# Patient Record
Sex: Male | Born: 1990 | Race: Black or African American | Hispanic: No | Marital: Single | Smoking: Former smoker
Health system: Southern US, Community
[De-identification: ages and names within clinical notes are randomized; demographics above are authoritative.]

## PROBLEM LIST (undated history)

## (undated) DIAGNOSIS — I10 Essential (primary) hypertension: Secondary | ICD-10-CM

## (undated) DIAGNOSIS — K859 Acute pancreatitis without necrosis or infection, unspecified: Secondary | ICD-10-CM

## (undated) DIAGNOSIS — F419 Anxiety disorder, unspecified: Secondary | ICD-10-CM

---
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
## 2011-07-29 ENCOUNTER — Emergency Department: Payer: Self-pay | Admitting: Emergency Medicine

## 2011-09-02 ENCOUNTER — Emergency Department: Payer: Self-pay | Admitting: Emergency Medicine

## 2011-09-02 LAB — COMPREHENSIVE METABOLIC PANEL
Albumin: 3.9 g/dL (ref 3.8–5.6)
Alkaline Phosphatase: 109 U/L (ref 98–317)
Anion Gap: 6 — ABNORMAL LOW (ref 7–16)
BUN: 9 mg/dL (ref 7–18)
Bilirubin,Total: 0.8 mg/dL (ref 0.2–1.0)
Calcium, Total: 8.7 mg/dL — ABNORMAL LOW (ref 9.0–10.7)
Creatinine: 1.04 mg/dL (ref 0.60–1.30)
EGFR (African American): >60
EGFR (Non-African Amer.): >60
Glucose: 84 mg/dL (ref 65–99)
Osmolality: 283 (ref 275–301)
SGOT(AST): 19 U/L (ref 10–41)
SGPT (ALT): 18 U/L
Sodium: 143 mmol/L (ref 136–145)

## 2011-09-02 LAB — CBC
MCV: 87 fL (ref 80–100)
WBC: 4.1 10*3/uL (ref 3.8–10.6)

## 2011-09-02 LAB — URINALYSIS, COMPLETE
Bacteria: NONE SEEN
Bilirubin,UR: NEGATIVE
Glucose,UR: NEGATIVE mg/dL (ref 0–75)
Leukocyte Esterase: NEGATIVE
Nitrite: NEGATIVE
Ph: 5 (ref 4.5–8.0)
RBC,UR: 2 /HPF (ref 0–5)
Specific Gravity: 1.029 (ref 1.003–1.030)
Squamous Epithelial: <1

## 2012-06-13 ENCOUNTER — Emergency Department: Payer: Self-pay | Admitting: Emergency Medicine

## 2012-08-18 ENCOUNTER — Emergency Department: Payer: Self-pay | Admitting: Emergency Medicine

## 2012-08-18 LAB — COMPREHENSIVE METABOLIC PANEL
Albumin: 4.2 g/dL (ref 3.4–5.0)
Alkaline Phosphatase: 83 U/L (ref 50–136)
Anion Gap: 6 — ABNORMAL LOW (ref 7–16)
BUN: 10 mg/dL (ref 7–18)
Calcium, Total: 9.2 mg/dL (ref 8.5–10.1)
Co2: 26 mmol/L (ref 21–32)
EGFR (African American): >60
EGFR (Non-African Amer.): >60
Glucose: 83 mg/dL (ref 65–99)
Osmolality: 276 (ref 275–301)
Potassium: 4.3 mmol/L (ref 3.5–5.1)
SGOT(AST): 22 U/L (ref 15–37)
SGPT (ALT): 23 U/L (ref 12–78)
Sodium: 139 mmol/L (ref 136–145)
Total Protein: 7.8 g/dL (ref 6.4–8.2)

## 2012-08-18 LAB — URINALYSIS, COMPLETE
Bacteria: NONE SEEN
Bilirubin,UR: NEGATIVE
Glucose,UR: NEGATIVE mg/dL (ref 0–75)
Ketone: NEGATIVE
Nitrite: NEGATIVE
Ph: 6 (ref 4.5–8.0)
Protein: NEGATIVE

## 2012-08-18 LAB — CBC
HCT: 44.8 % (ref 40.0–52.0)
MCV: 89 fL (ref 80–100)
Platelet: 349 10*3/uL (ref 150–440)
RBC: 5.06 10*6/uL (ref 4.40–5.90)
RDW: 13.2 % (ref 11.5–14.5)
WBC: 6 10*3/uL (ref 3.8–10.6)

## 2012-08-18 LAB — LIPASE, BLOOD: Lipase: 94 U/L (ref 73–393)

## 2013-01-01 ENCOUNTER — Emergency Department: Payer: Self-pay | Admitting: Emergency Medicine

## 2015-06-15 ENCOUNTER — Emergency Department
Admission: EM | Admit: 2015-06-15 | Discharge: 2015-06-15 | Disposition: A | Discharge status: Home / Self Care | Payer: Medicaid - Out of State | Attending: Emergency Medicine | Admitting: Emergency Medicine

## 2015-06-15 ENCOUNTER — Emergency Department: Payer: Medicaid - Out of State

## 2015-06-15 ENCOUNTER — Encounter: Payer: Self-pay | Admitting: Emergency Medicine

## 2015-06-15 DIAGNOSIS — Y9389 Activity, other specified: Secondary | ICD-10-CM | POA: Insufficient documentation

## 2015-06-15 DIAGNOSIS — R1013 Epigastric pain: Secondary | ICD-10-CM

## 2015-06-15 DIAGNOSIS — T148 Other injury of unspecified body region: Secondary | ICD-10-CM | POA: Insufficient documentation

## 2015-06-15 DIAGNOSIS — Y998 Other external cause status: Secondary | ICD-10-CM | POA: Insufficient documentation

## 2015-06-15 DIAGNOSIS — K219 Gastro-esophageal reflux disease without esophagitis: Secondary | ICD-10-CM | POA: Insufficient documentation

## 2015-06-15 DIAGNOSIS — R079 Chest pain, unspecified: Secondary | ICD-10-CM

## 2015-06-15 DIAGNOSIS — Y9289 Other specified places as the place of occurrence of the external cause: Secondary | ICD-10-CM | POA: Insufficient documentation

## 2015-06-15 DIAGNOSIS — H6123 Impacted cerumen, bilateral: Secondary | ICD-10-CM

## 2015-06-15 DIAGNOSIS — X58XXXA Exposure to other specified factors, initial encounter: Secondary | ICD-10-CM | POA: Insufficient documentation

## 2015-06-15 DIAGNOSIS — M546 Pain in thoracic spine: Secondary | ICD-10-CM | POA: Insufficient documentation

## 2015-06-15 DIAGNOSIS — Z87891 Personal history of nicotine dependence: Secondary | ICD-10-CM | POA: Insufficient documentation

## 2015-06-15 LAB — URINALYSIS COMPLETE WITH MICROSCOPIC (ARMC ONLY)
BACTERIA UA: NONE SEEN
BILIRUBIN URINE: NEGATIVE
GLUCOSE, UA: NEGATIVE mg/dL
Hgb urine dipstick: NEGATIVE
Ketones, ur: NEGATIVE mg/dL
Leukocytes, UA: NEGATIVE
Nitrite: NEGATIVE
PH: 7 (ref 5.0–8.0)
Protein, ur: NEGATIVE mg/dL
Specific Gravity, Urine: 1.017 (ref 1.005–1.030)

## 2015-06-15 LAB — COMPREHENSIVE METABOLIC PANEL
ALT: 15 U/L — AB (ref 17–63)
AST: 17 U/L (ref 15–41)
Albumin: 4.3 g/dL (ref 3.5–5.0)
Alkaline Phosphatase: 73 U/L (ref 38–126)
Anion gap: 6 (ref 5–15)
BUN: 11 mg/dL (ref 6–20)
CHLORIDE: 106 mmol/L (ref 101–111)
CO2: 27 mmol/L (ref 22–32)
CREATININE: 1.05 mg/dL (ref 0.61–1.24)
Calcium: 9.3 mg/dL (ref 8.9–10.3)
GFR calc non Af Amer: >60 mL/min (ref >60)
Glucose, Bld: 107 mg/dL — ABNORMAL HIGH (ref 65–99)
POTASSIUM: 3.7 mmol/L (ref 3.5–5.1)
SODIUM: 139 mmol/L (ref 135–145)
Total Bilirubin: 0.6 mg/dL (ref 0.3–1.2)
Total Protein: 7.3 g/dL (ref 6.5–8.1)

## 2015-06-15 LAB — CBC
HEMATOCRIT: 45.9 % (ref 40.0–52.0)
Hemoglobin: 15 g/dL (ref 13.0–18.0)
MCH: 28.2 pg (ref 26.0–34.0)
MCHC: 32.7 g/dL (ref 32.0–36.0)
MCV: 86.2 fL (ref 80.0–100.0)
Platelets: 326 10*3/uL (ref 150–440)
RBC: 5.33 MIL/uL (ref 4.40–5.90)
RDW: 13.5 % (ref 11.5–14.5)
WBC: 6 10*3/uL (ref 3.8–10.6)

## 2015-06-15 LAB — TROPONIN I: Troponin I: <0.03 ng/mL (ref <0.031)

## 2015-06-15 LAB — LIPASE, BLOOD: LIPASE: 21 U/L (ref 11–51)

## 2015-06-15 MED ORDER — RANITIDINE HCL 150 MG PO TABS: 150.0000 mg | ORAL_TABLET | Freq: Two times a day (BID) | ORAL | Status: DC

## 2015-06-15 NOTE — ED Provider Notes (Signed)
[facility] Emergency Department Provider Note  ____________________________________________  Time seen: Approximately 730 PM  I have reviewed the triage vital signs and the nursing notes.   HISTORY  Chief Complaint Abdominal Pain; Otalgia; and Back Pain    HPI ARCHIT LEGER is a 25 y.o. male without any chronic medical problems was presenting to the emergency department today with abdominal burning. He says that the pain is to his left upper quadrant and radiating through to his back. He also says that he has left shoulder blade pain. He says he is also having chest pain as well as pressure in his ears bilaterally but worse in the right ear. He says he uses a Q-tip to clean his ears out regularly. Denies any worsening with exertion of the chest pain. Says that the chest pain is there all the time and is mild to moderate. Symptoms have been ongoing for at least 3-4 days. Says that he has also increased the amount of spicy and fatty foods that he has eaten over the past several days. Admits to nausea but no vomiting. Denies any diarrhea.   History reviewed. No pertinent past medical history.  There are no active problems to display for this patient.   History reviewed. No pertinent past surgical history.  No current outpatient prescriptions on file.  Allergies Review of patient's allergies indicates no known allergies.  No family history on file.  Social History Social History  Substance Use Topics  . Smoking status: Former Smoker    Quit date: 04/17/2015  . Smokeless tobacco: None  . Alcohol Use: No    Review of Systems Constitutional: No fever/chills Eyes: No visual changes. ENT: No sore throat. Cardiovascular: As above Respiratory: Denies shortness of breath. Gastrointestinal:  no vomiting.  No diarrhea.  No constipation. Genitourinary: Negative for dysuria. Musculoskeletal: Negative for back pain. Skin: Negative for  rash. Neurological: Negative for headaches, focal weakness or numbness.  10-point ROS otherwise negative.  ____________________________________________   PHYSICAL EXAM:  VITAL SIGNS: ED Triage Vitals  Enc Vitals Group     BP 06/15/15 1540 143/76 mmHg     Pulse Rate 06/15/15 1540 87     Resp 06/15/15 1540 20     Temp 06/15/15 1540 97.7 F (36.5 C)     Temp Source 06/15/15 1540 Oral     SpO2 06/15/15 1540 98 %     Weight 06/15/15 1540 184 lb (83.462 kg)     Height 06/15/15 1540  (1.651 m)     Head Cir --      Peak Flow --      Pain Score 06/15/15 1542 9     Pain Loc --      Pain Edu? --      Excl. in GC? --     Constitutional: Alert and oriented. Well appearing and in no acute distress. Eyes: Conjunctivae are normal. PERRL. EOMI. Head: Atraumatic. Cerumen impaction to the bilateral external canals. Nose: No congestion/rhinnorhea. Mouth/Throat: Mucous membranes are moist.   Neck: No stridor.   Cardiovascular: Normal rate, regular rhythm. Grossly normal heart sounds.  Good peripheral circulation. Respiratory: Normal respiratory effort.  No retractions. Lungs CTAB. Gastrointestinal: Soft and nontender. No distention. No abdominal bruits. No CVA tenderness. Musculoskeletal: No lower extremity tenderness nor edema.  No joint effusions. Chest pain is not reproducible. No tenderness or deformity to the C, T or L spines. Tenderness to palpation to left sided rhomboid group. Neurologic:  Normal speech and language. No  gross focal neurologic deficits are appreciated.  Skin:  Skin is warm, dry and intact. No rash noted. Psychiatric: Mood and affect are normal. Speech and behavior are normal.  ____________________________________________   LABS (all labs ordered are listed, but only abnormal results are displayed)  Labs Reviewed  COMPREHENSIVE METABOLIC PANEL - Abnormal; Notable for the following:    Glucose, Bld 107 (*)    ALT 15 (*)    All other components within normal  limits  URINALYSIS COMPLETEWITH MICROSCOPIC ([facility] ONLY) - Abnormal; Notable for the following:    Color, Urine YELLOW (*)    APPearance CLEAR (*)    Squamous Epithelial / LPF 0-5 (*)    All other components within normal limits  LIPASE, BLOOD  CBC  TROPONIN I   ____________________________________________  EKG  ED ECG REPORT I, Arelia Longest, the attending physician, personally viewed and interpreted this ECG.   Date: 06/15/2015  EKG Time: 1934  Rate: 71  Rhythm: normal sinus rhythm  Axis: Normal  Intervals:none  ST&T Change: Diffuse ST elevation with a concave morphology consistent with an early repolarization. No abnormal T-wave inversion.  ____________________________________________  RADIOLOGY  No active cardiopulmonary disease on the chest x-ray. ____________________________________________   PROCEDURES   Ceruminosis is noted.  Wax is removed by syringing and manual debridement. Instructions for home care to prevent wax buildup are given.  ____________________________________________   INITIAL IMPRESSION / ASSESSMENT AND PLAN / ED COURSE  Pertinent labs & imaging results that were available during my care of the patient were reviewed by me and considered in my medical decision making (see chart for details).  Cerumen was disimpacted and the patient says that his ear pressure is now relieved. Feel that the remaining symptoms can be explained with GERD.PERC negative.    ----------------------------------------- 8:47 PM on 06/15/2015 -----------------------------------------  Patient with very reassuring lab workup. Likely musculoskeletal back pain. We'll treat with Zantac for symptoms of burning in his stomach when lying flat. We'll also give follow-up information with the [facility] clinic. Patient does not have a primary care doctor locally. ____________________________________________   FINAL CLINICAL IMPRESSION(S) / ED DIAGNOSES  Chest pain. Abdominal  pain. Muscle strain.    Myrna Blazer, MD 06/15/15 2049

## 2015-06-15 NOTE — ED Notes (Signed)
Pt states back pain that increases when he lies down at night, states burning in his abdomen and back, states left shoulder blade pain and states he feels lightheaded if he moves too quickly, also states ear pain, states "everything seems faded", pt awake and alert in no acute distress

## 2015-06-15 NOTE — Discharge Instructions (Signed)
Abdominal Pain, Adult Many things can cause abdominal pain. Usually, abdominal pain is not caused by a disease and will improve without treatment. It can often be observed and treated at home. Your health care provider will do a physical exam and possibly order blood tests and X-rays to help determine the seriousness of your pain. However, in many cases, more time must pass before a clear cause of the pain can be found. Before that point, your health care provider may not know if you need more testing or further treatment. HOME CARE INSTRUCTIONS Monitor your abdominal pain for any changes. The following actions may help to alleviate any discomfort you are experiencing:  Only take over-the-counter or prescription medicines as directed by your health care provider.  Do not take laxatives unless directed to do so by your health care provider.  Try a clear liquid diet (broth, tea, or water) as directed by your health care provider. Slowly move to a bland diet as tolerated. SEEK MEDICAL CARE IF:  You have unexplained abdominal pain.  You have abdominal pain associated with nausea or diarrhea.  You have pain when you urinate or have a bowel movement.  You experience abdominal pain that wakes you in the night.  You have abdominal pain that is worsened or improved by eating food.  You have abdominal pain that is worsened with eating fatty foods.  You have a fever. SEEK IMMEDIATE MEDICAL CARE IF:  Your pain does not go away within 2 hours.  You keep throwing up (vomiting).  Your pain is felt only in portions of the abdomen, such as the right side or the left lower portion of the abdomen.  You pass bloody or black tarry stools. MAKE SURE YOU:  Understand these instructions.  Will watch your condition.  Will get help right away if you are not doing well or get worse.   This information is not intended to replace advice given to you by your health care provider. Make sure you discuss  any questions you have with your health care provider.   Document Released: 01/09/2005 Document Revised: 12/21/2014 Document Reviewed: 12/09/2012 Elsevier Interactive Patient Education 2016 [employer]  Back Pain, Adult Back pain is very common. The pain often gets better over time. The cause of back pain is usually not dangerous. Most people can learn to manage their back pain on their own.  HOME CARE  Watch your back pain for any changes. The following actions may help to lessen any pain you are feeling:  Stay active. Start with short walks on flat ground if you can. Try to walk farther each day.  Exercise regularly as told by your doctor. Exercise helps your back heal faster. It also helps avoid future injury by keeping your muscles strong and flexible.  Do not sit, drive, or stand in one place for more than 30 minutes.  Do not stay in bed. Resting more than 1-2 days can slow down your recovery.  Be careful when you bend or lift an object. Use good form when lifting:  Bend at your knees.  Keep the object close to your body.  Do not twist.  Sleep on a firm mattress. Lie on your side, and bend your knees. If you lie on your back, put a pillow under your knees.  Take medicines only as told by your doctor.  Put ice on the injured area.  Put ice in a plastic bag.  Place a towel between your skin and the bag.  Leave the ice on for 20 minutes, 2-3 times a day for the first 2-3 days. After that, you can switch between ice and heat packs.  Avoid feeling anxious or stressed. Find good ways to deal with stress, such as exercise.  Maintain a healthy weight. Extra weight puts stress on your back. GET HELP IF:   You have pain that does not go away with rest or medicine.  You have worsening pain that goes down into your legs or buttocks.  You have pain that does not get better in one week.  You have pain at night.  You lose weight.  You have a fever or chills. GET HELP  RIGHT AWAY IF:   You cannot control when you poop (bowel movement) or pee (urinate).  Your arms or legs feel weak.  Your arms or legs lose feeling (numbness).  You feel sick to your stomach (nauseous) or throw up (vomit).  You have belly (abdominal) pain.  You feel like you may pass out (faint).   This information is not intended to replace advice given to you by your health care provider. Make sure you discuss any questions you have with your health care provider.   Document Released: 09/18/2007 Document Revised: 04/22/2014 Document Reviewed: 08/03/2013 Elsevier Interactive Patient Education 2016 [employer] Inc[employer]  Gastroesophageal Reflux Disease, Adult Normally, food travels down the esophagus and stays in the stomach to be digested. However, when a person has gastroesophageal reflux disease (GERD), food and stomach acid move back up into the esophagus. When this happens, the esophagus becomes sore and inflamed. Over time, GERD can create small holes (ulcers) in the lining of the esophagus.  CAUSES This condition is caused by a problem with the muscle between the esophagus and the stomach (lower esophageal sphincter, or LES). Normally, the LES muscle closes after food passes through the esophagus to the stomach. When the LES is weakened or abnormal, it does not close properly, and that allows food and stomach acid to go back up into the esophagus. The LES can be weakened by certain dietary substances, medicines, and medical conditions, including:  Tobacco use.  Pregnancy.  Having a hiatal hernia.  Heavy alcohol use.  Certain foods and beverages, such as coffee, chocolate, onions, and peppermint. RISK FACTORS This condition is more likely to develop in:  People who have an increased body weight.  People who have connective tissue disorders.  People who use NSAID medicines. SYMPTOMS Symptoms of this condition include:  Heartburn.  Difficult or painful swallowing.  The  feeling of having a lump in the throat.  Abitter taste in the mouth.  Bad breath.  Having a large amount of saliva.  Having an upset or bloated stomach.  Belching.  Chest pain.  Shortness of breath or wheezing.  Ongoing (chronic) cough or a night-time cough.  Wearing away of tooth enamel.  Weight loss. Different conditions can cause chest pain. Make sure to see your health care provider if you experience chest pain. DIAGNOSIS Your health care provider will take a medical history and perform a physical exam. To determine if you have mild or severe GERD, your health care provider may also monitor how you respond to treatment. You may also have other tests, including:  An endoscopy toexamine your stomach and esophagus with a small camera.  A test thatmeasures the acidity level in your esophagus.  A test thatmeasures how much pressure is on your esophagus.  A barium swallow or modified barium swallow to show the shape,  size, and functioning of your esophagus. TREATMENT The goal of treatment is to help relieve your symptoms and to prevent complications. Treatment for this condition may vary depending on how severe your symptoms are. Your health care provider may recommend:  Changes to your diet.  Medicine.  Surgery. HOME CARE INSTRUCTIONS Diet  Follow a diet as recommended by your health care provider. This may involve avoiding foods and drinks such as:  Coffee and tea (with or without caffeine).  Drinks that containalcohol.  Energy drinks and sports drinks.  Carbonated drinks or sodas.  Chocolate and cocoa.  Peppermint and mint flavorings.  Garlic and onions.  Horseradish.  Spicy and acidic foods, including peppers, chili powder, curry powder, vinegar, hot sauces, and barbecue sauce.  Citrus fruit juices and citrus fruits, such as oranges, lemons, and limes.  Tomato-based foods, such as red sauce, chili, salsa, and pizza with red sauce.  Fried and  fatty foods, such as donuts, french fries, potato chips, and high-fat dressings.  High-fat meats, such as hot dogs and fatty cuts of red and white meats, such as rib eye steak, sausage, ham, and bacon.  High-fat dairy items, such as whole milk, butter, and cream cheese.  Eat small, frequent meals instead of large meals.  Avoid drinking large amounts of liquid with your meals.  Avoid eating meals during the 2-3 hours before bedtime.  Avoid lying down right after you eat.  Do not exercise right after you eat. General Instructions  Pay attention to any changes in your symptoms.  Take over-the-counter and prescription medicines only as told by your health care provider. Do not take aspirin, ibuprofen, or other NSAIDs unless your health care provider told you to do so.  Do not use any tobacco products, including cigarettes, chewing tobacco, and e-cigarettes. If you need help quitting, ask your health care provider.  Wear loose-fitting clothing. Do not wear anything tight around your waist that causes pressure on your abdomen.  Raise (elevate) the head of your bed 6 inches (15cm).  Try to reduce your stress, such as with yoga or meditation. If you need help reducing stress, ask your health care provider.  If you are overweight, reduce your weight to an amount that is healthy for you. Ask your health care provider for guidance about a safe weight loss goal.  Keep all follow-up visits as told by your health care provider. This is important. SEEK MEDICAL CARE IF:  You have new symptoms.  You have unexplained weight loss.  You have difficulty swallowing, or it hurts to swallow.  You have wheezing or a persistent cough.  Your symptoms do not improve with treatment.  You have a hoarse voice. SEEK IMMEDIATE MEDICAL CARE IF:  You have pain in your arms, neck, jaw, teeth, or back.  You feel sweaty, dizzy, or light-headed.  You have chest pain or shortness of breath.  You vomit  and your vomit looks like blood or coffee grounds.  You faint.  Your stool is bloody or black.  You cannot swallow, drink, or eat.   This information is not intended to replace advice given to you by your health care provider. Make sure you discuss any questions you have with your health care provider.   Document Released: 01/09/2005 Document Revised: 12/21/2014 Document Reviewed: 07/27/2014 Elsevier Interactive Patient Education [employer]

## 2015-06-15 NOTE — ED Notes (Signed)
Patient presents to the ED with abdominal pain and back pain that patient describes as intermittent burning.  Patient reports that when pain occurs it occurs simultaneously in her abdomen and his back.  Patient has a dark rash to his abdomen and is complaining of difficulty hearing that began after patient used q-tips on Monday.  Patient appears anxious in triage and states, "I just want to find out what's wrong with me before anything gets worse."  Patient denies any dysuria, vomiting and diarrhea.

## 2015-08-09 ENCOUNTER — Emergency Department
Admission: EM | Admit: 2015-08-09 | Discharge: 2015-08-10 | Disposition: A | Discharge status: Home / Self Care | Payer: Self-pay | Attending: Emergency Medicine | Admitting: Emergency Medicine

## 2015-08-09 ENCOUNTER — Encounter: Payer: Self-pay | Admitting: Emergency Medicine

## 2015-08-09 DIAGNOSIS — Z87891 Personal history of nicotine dependence: Secondary | ICD-10-CM | POA: Insufficient documentation

## 2015-08-09 DIAGNOSIS — R51 Headache: Secondary | ICD-10-CM | POA: Insufficient documentation

## 2015-08-09 DIAGNOSIS — M542 Cervicalgia: Secondary | ICD-10-CM | POA: Insufficient documentation

## 2015-08-09 DIAGNOSIS — R519 Headache, unspecified: Secondary | ICD-10-CM

## 2015-08-09 MED ORDER — ONDANSETRON HCL 4 MG/2ML IJ SOLN
4.0000 mg | Freq: Once | INTRAMUSCULAR | Status: AC
  Administered 2015-08-09: 4 mg via INTRAVENOUS
  Filled 2015-08-09: qty 2

## 2015-08-09 MED ORDER — MORPHINE SULFATE (PF) 2 MG/ML IV SOLN
2.0000 mg | Freq: Once | INTRAVENOUS | Status: AC
  Administered 2015-08-09: 2 mg via INTRAVENOUS
  Filled 2015-08-09: qty 1

## 2015-08-09 NOTE — ED Notes (Addendum)
Patient ambulatory to triage with steady gait, without difficulty or distress noted; pt reports x week having neck pain causing HA; denies any injury

## 2015-08-09 NOTE — ED Provider Notes (Signed)
[facility] Emergency Department Provider Note  ____________________________________________  Time seen: 11:15  I have reviewed the triage vital signs and the nursing notes.   HISTORY  Chief Complaint Torticollis and Headache      HPI Marcus Small is a 25 y.o. male with no previous history of chronic headaches presents with one-week history of left-sided occipital headache/neck pain that is currently 10 out of 10. Patient states that the pain is persistent over the period of that time and unrelieved with 800 mg of ibuprofen and BC powder intermittently. Patient also admits to intermittent left eye blurry vision which spontaneous resolution. Patient denies any family history of aneurysm no family history of migraines. Patient denies any weakness numbness or gait instability. Patient denies any changes in speech     Past medical history None There are no active problems to display for this patient.   Past Surgical history None  Current Outpatient Rx  Name  Route  Sig  Dispense  Refill  . ranitidine (ZANTAC) 150 MG tablet   Oral   Take 1 tablet (150 mg total) by mouth 2 (two) times daily.   60 tablet   0     Allergies Shellfish allergy  No family history on file.  Social History Social History  Substance Use Topics  . Smoking status: Former Smoker    Quit date: 04/17/2015  . Smokeless tobacco: None  . Alcohol Use: No    Review of Systems  Constitutional: Negative for fever. Eyes: Positive for intermittent left eye blurred vision ENT: Negative for sore throat. Cardiovascular: Negative for chest pain. Respiratory: Negative for shortness of breath. Gastrointestinal: Negative for abdominal pain, vomiting and diarrhea. Genitourinary: Negative for dysuria. Musculoskeletal: Negative for back pain. Skin: Negative for rash. Neurological: Positive for headache/neck pain and left eye blurred vision.   10-point ROS otherwise  negative.  ____________________________________________   PHYSICAL EXAM:  VITAL SIGNS: ED Triage Vitals  Enc Vitals Group     BP 08/09/15 2255 140/76 mmHg     Pulse Rate 08/09/15 2255 70     Resp 08/09/15 2255 20     Temp 08/09/15 2255 98 F (36.7 C)     Temp Source 08/09/15 2255 Oral     SpO2 08/09/15 2255 98 %     Weight 08/09/15 2255 180 lb (81.647 kg)     Height 08/09/15 2255 5\' 5"  (1.651 m)     Head Cir --      Peak Flow --      Pain Score 08/09/15 2254 10     Pain Loc --      Pain Edu? --      Excl. in GC? --      Constitutional: Alert and oriented. Apparent discomfort Eyes: Conjunctivae are normal. PERRL. Normal extraocular movements. ENT   Head: Normocephalic and atraumatic.   Nose: No congestion/rhinnorhea.   Mouth/Throat: Mucous membranes are moist.   Neck: No stridor. Hematological/Lymphatic/Immunilogical: No cervical lymphadenopathy. Cardiovascular: Normal rate, regular rhythm. Normal and symmetric distal pulses are present in all extremities. No murmurs, rubs, or gallops. Respiratory: Normal respiratory effort without tachypnea nor retractions. Breath sounds are clear and equal bilaterally. No wheezes/rales/rhonchi. Gastrointestinal: Soft and nontender. No distention. There is no CVA tenderness. Genitourinary: deferred Musculoskeletal: Nontender with normal range of motion in all extremities. No joint effusions.  No lower extremity tenderness nor edema. Left trapezius pain with palpation Neurologic:  Normal speech and language. No gross focal neurologic deficits are appreciated. Speech is normal.  Skin:  Skin is warm, dry and intact. No rash noted. Psychiatric: Mood and affect are normal. Speech and behavior are normal. Patient exhibits appropriate insight and judgment.  ____________________________________________    LABS (pertinent positives/negatives)  Labs Reviewed  BASIC METABOLIC PANEL - Abnormal; Notable for the following:     Glucose, Bld 103 (*)    All other components within normal limits  CBC       RADIOLOGY CT Angio Neck W/Cm &/Or Wo/Cm (Final result) Result time: 08/10/15 01:30:44   Final result by Rad Results In Interface (08/10/15 01:30:44)   Narrative:   CLINICAL DATA: Neck pain and occipital headache for 1 week, worse when looking down or lifting LEFT arm. Single transient episode of LEFT blurry vision.  EXAM: CT ANGIOGRAPHY HEAD AND NECK  TECHNIQUE: Multidetector CT imaging of the head and neck was performed using the standard protocol during bolus administration of intravenous contrast. Multiplanar CT image reconstructions and MIPs were obtained to evaluate the vascular anatomy. Carotid stenosis measurements (when applicable) are obtained utilizing NASCET criteria, using the distal internal carotid diameter as the denominator.  CONTRAST: 100 cc Isovue 370  COMPARISON: None.  FINDINGS: CT HEAD  INTRACRANIAL CONTENTS: The ventricles and sulci are normal. No intraparenchymal hemorrhage, mass effect nor midline shift. No acute large vascular territory infarcts. No abnormal extra-axial fluid collections. Basal cisterns are patent. No abnormal intracranial enhancement.  ORBITS: The included ocular globes and orbital contents are normal.  SINUSES: Small LEFT maxillary mucosal retention cyst without paranasal sinus air-fluid levels. Mastoid air cells are well aerated.  SKULL/SOFT TISSUES: No skull fracture. No significant soft tissue swelling.  CTA NECK  Aortic arch: Normal appearance of the thoracic arch, normal branch pattern. The origins of the innominate, left Common carotid artery and subclavian artery are widely patent.  Right carotid system: Common carotid artery is widely patent, coursing in a straight line fashion. Normal appearance of the carotid bifurcation without hemodynamically significant stenosis by NASCET criteria. Normal appearance of the included  internal carotid artery.  Left carotid system: Common carotid artery is widely patent, coursing in a straight line fashion. Normal appearance of the carotid bifurcation without hemodynamically significant stenosis by NASCET criteria. Normal appearance of the included internal carotid artery.  Vertebral arteries:Venous contamination limits assessment of the LEFT vertebral artery origin. Codominant vertebral arteries. Normal appearance of the vertebral arteries, which appear widely patent.  Skeleton: No acute osseous process though bone windows have not been submitted.  Other neck: Soft tissues of the neck are non-acute though, not tailored for evaluation. Borderline symmetric lymphadenopathy is likely reactive.  CTA HEAD (limited by venous contamination, delayed phase)  Anterior circulation: Normal appearance of the cervical internal carotid arteries, petrous, cavernous and supra clinoid internal carotid arteries. Widely patent anterior communicating artery. Normal appearance of the anterior and middle cerebral arteries.  Posterior circulation: Normal appearance of the vertebral arteries, vertebrobasilar junction and basilar artery, as well as main branch vessels. Small bilateral posterior communicating arteries present. Normal appearance of the posterior cerebral arteries.  No large vessel occlusion, hemodynamically significant stenosis, dissection, luminal irregularity, contrast extravasation or aneurysm within the anterior nor posterior circulation.  IMPRESSION: Negative CT head with and without contrast.  Negative CTA neck.  Negative CTA head, limited by delayed phase.   Electronically Signed By: Awilda Metro M.D. On: 08/10/2015 01:30          CT Angio Head W/Cm &/Or Wo Cm (Final result) Result time: 08/10/15 01:30:44   Final result by Rad Results  In Interface (08/10/15 01:30:44)   Narrative:   CLINICAL DATA: Neck pain and occipital headache for  1 week, worse when looking down or lifting LEFT arm. Single transient episode of LEFT blurry vision.  EXAM: CT ANGIOGRAPHY HEAD AND NECK  TECHNIQUE: Multidetector CT imaging of the head and neck was performed using the standard protocol during bolus administration of intravenous contrast. Multiplanar CT image reconstructions and MIPs were obtained to evaluate the vascular anatomy. Carotid stenosis measurements (when applicable) are obtained utilizing NASCET criteria, using the distal internal carotid diameter as the denominator.  CONTRAST: 100 cc Isovue 370  COMPARISON: None.  FINDINGS: CT HEAD  INTRACRANIAL CONTENTS: The ventricles and sulci are normal. No intraparenchymal hemorrhage, mass effect nor midline shift. No acute large vascular territory infarcts. No abnormal extra-axial fluid collections. Basal cisterns are patent. No abnormal intracranial enhancement.  ORBITS: The included ocular globes and orbital contents are normal.  SINUSES: Small LEFT maxillary mucosal retention cyst without paranasal sinus air-fluid levels. Mastoid air cells are well aerated.  SKULL/SOFT TISSUES: No skull fracture. No significant soft tissue swelling.  CTA NECK  Aortic arch: Normal appearance of the thoracic arch, normal branch pattern. The origins of the innominate, left Common carotid artery and subclavian artery are widely patent.  Right carotid system: Common carotid artery is widely patent, coursing in a straight line fashion. Normal appearance of the carotid bifurcation without hemodynamically significant stenosis by NASCET criteria. Normal appearance of the included internal carotid artery.  Left carotid system: Common carotid artery is widely patent, coursing in a straight line fashion. Normal appearance of the carotid bifurcation without hemodynamically significant stenosis by NASCET criteria. Normal appearance of the included internal carotid artery.  Vertebral  arteries:Venous contamination limits assessment of the LEFT vertebral artery origin. Codominant vertebral arteries. Normal appearance of the vertebral arteries, which appear widely patent.  Skeleton: No acute osseous process though bone windows have not been submitted.  Other neck: Soft tissues of the neck are non-acute though, not tailored for evaluation. Borderline symmetric lymphadenopathy is likely reactive.  CTA HEAD (limited by venous contamination, delayed phase)  Anterior circulation: Normal appearance of the cervical internal carotid arteries, petrous, cavernous and supra clinoid internal carotid arteries. Widely patent anterior communicating artery. Normal appearance of the anterior and middle cerebral arteries.  Posterior circulation: Normal appearance of the vertebral arteries, vertebrobasilar junction and basilar artery, as well as main branch vessels. Small bilateral posterior communicating arteries present. Normal appearance of the posterior cerebral arteries.  No large vessel occlusion, hemodynamically significant stenosis, dissection, luminal irregularity, contrast extravasation or aneurysm within the anterior nor posterior circulation.  IMPRESSION: Negative CT head with and without contrast.  Negative CTA neck.  Negative CTA head, limited by delayed phase.   Electronically Signed By: Awilda Metro M.D. On: 08/10/2015 01:30         INITIAL IMPRESSION / ASSESSMENT AND PLAN / ED COURSE  Pertinent labs & imaging results that were available during my care of the patient were reviewed by me and considered in my medical decision making (see chart for details).  Patient received IV morphine with pain improvement. CT angiogram performed secondary to concern for possible vascular etiology of the patient's neck/occipital headache in conjunction with reported left eye blurred vision intermittently which patient does not have at  present.  ____________________________________________   FINAL CLINICAL IMPRESSION(S) / ED DIAGNOSES  Final diagnoses:  Neck pain on left side  Acute nonintractable headache, unspecified headache type      Darci Current, MD  08/10/15 0153 

## 2015-08-09 NOTE — ED Notes (Signed)
Pt presents to ED with c/o neck pain and headache x 1 week, reports has been taking 800 mg Ibuprofen without relief. Pt reports pain increases when lifting left arm or looking down. Pt reports had one episode when "my left eye became blurry once and then went away." Pt alert and oriented x 4, no increased work in breathing noted. Skin warm and dry. Dr. Manson PasseyBrown at bedside.

## 2015-08-10 ENCOUNTER — Emergency Department: Payer: Self-pay

## 2015-08-10 LAB — CBC
HEMATOCRIT: 42.2 % (ref 40.0–52.0)
Hemoglobin: 14.2 g/dL (ref 13.0–18.0)
MCH: 29.3 pg (ref 26.0–34.0)
MCHC: 33.5 g/dL (ref 32.0–36.0)
MCV: 87.5 fL (ref 80.0–100.0)
PLATELETS: 312 10*3/uL (ref 150–440)
RBC: 4.83 MIL/uL (ref 4.40–5.90)
RDW: 14.1 % (ref 11.5–14.5)
WBC: 7 10*3/uL (ref 3.8–10.6)

## 2015-08-10 LAB — BASIC METABOLIC PANEL
Anion gap: 6 (ref 5–15)
BUN: 13 mg/dL (ref 6–20)
CALCIUM: 9 mg/dL (ref 8.9–10.3)
CO2: 25 mmol/L (ref 22–32)
CREATININE: 1.01 mg/dL (ref 0.61–1.24)
Chloride: 108 mmol/L (ref 101–111)
GFR calc Af Amer: >60 mL/min (ref >60)
GLUCOSE: 103 mg/dL — AB (ref 65–99)
Potassium: 3.6 mmol/L (ref 3.5–5.1)
Sodium: 139 mmol/L (ref 135–145)

## 2015-08-10 MED ORDER — KETOROLAC TROMETHAMINE 10 MG PO TABS: 10.0000 mg | ORAL_TABLET | Freq: Three times a day (TID) | ORAL | Status: DC | PRN

## 2015-08-10 MED ORDER — IOPAMIDOL (ISOVUE-370) INJECTION 76%
100.0000 mL | Freq: Once | INTRAVENOUS | Status: AC | PRN
  Administered 2015-08-10: 100 mL via INTRAVENOUS

## 2015-08-10 MED ORDER — CYCLOBENZAPRINE HCL 10 MG PO TABS
10.0000 mg | ORAL_TABLET | Freq: Once | ORAL | Status: AC
  Administered 2015-08-10: 10 mg via ORAL
  Filled 2015-08-10: qty 1

## 2015-08-10 MED ORDER — KETOROLAC TROMETHAMINE 10 MG PO TABS
10.0000 mg | ORAL_TABLET | Freq: Once | ORAL | Status: AC
  Administered 2015-08-10: 10 mg via ORAL
  Filled 2015-08-10: qty 1

## 2015-08-10 MED ORDER — CYCLOBENZAPRINE HCL 10 MG PO TABS: 10.0000 mg | ORAL_TABLET | Freq: Three times a day (TID) | ORAL | Status: AC | PRN

## 2015-08-10 NOTE — ED Notes (Signed)
Patient transported to CT via stretcher.

## 2015-08-10 NOTE — Discharge Instructions (Signed)

## 2018-12-01 ENCOUNTER — Other Ambulatory Visit: Payer: Self-pay

## 2018-12-01 ENCOUNTER — Encounter: Payer: Self-pay | Admitting: *Deleted

## 2018-12-01 ENCOUNTER — Emergency Department: Payer: Self-pay

## 2018-12-01 ENCOUNTER — Emergency Department
Admission: EM | Admit: 2018-12-01 | Discharge: 2018-12-02 | Disposition: A | Discharge status: Home / Self Care | Payer: Self-pay | Attending: Emergency Medicine | Admitting: Emergency Medicine

## 2018-12-01 DIAGNOSIS — F17210 Nicotine dependence, cigarettes, uncomplicated: Secondary | ICD-10-CM | POA: Insufficient documentation

## 2018-12-01 DIAGNOSIS — R0602 Shortness of breath: Secondary | ICD-10-CM

## 2018-12-01 DIAGNOSIS — I10 Essential (primary) hypertension: Secondary | ICD-10-CM | POA: Insufficient documentation

## 2018-12-01 DIAGNOSIS — R079 Chest pain, unspecified: Secondary | ICD-10-CM

## 2018-12-01 DIAGNOSIS — J40 Bronchitis, not specified as acute or chronic: Secondary | ICD-10-CM

## 2018-12-01 HISTORY — DX: Essential (primary) hypertension: I10

## 2018-12-01 HISTORY — DX: Anxiety disorder, unspecified: F41.9

## 2018-12-01 LAB — BASIC METABOLIC PANEL
Anion gap: 8 (ref 5–15)
BUN: 8 mg/dL (ref 6–20)
CO2: 26 mmol/L (ref 22–32)
Calcium: 9.4 mg/dL (ref 8.9–10.3)
Chloride: 104 mmol/L (ref 98–111)
Creatinine, Ser: 1.04 mg/dL (ref 0.61–1.24)
GFR calc Af Amer: >60 mL/min (ref >60)
GFR calc non Af Amer: >60 mL/min (ref >60)
Glucose, Bld: 93 mg/dL (ref 70–99)
Potassium: 3.9 mmol/L (ref 3.5–5.1)
Sodium: 138 mmol/L (ref 135–145)

## 2018-12-01 LAB — CBC
HCT: 42.8 % (ref 39.0–52.0)
Hemoglobin: 13.6 g/dL (ref 13.0–17.0)
MCH: 29.4 pg (ref 26.0–34.0)
MCHC: 31.8 g/dL (ref 30.0–36.0)
MCV: 92.6 fL (ref 80.0–100.0)
Platelets: 335 10*3/uL (ref 150–400)
RBC: 4.62 MIL/uL (ref 4.22–5.81)
RDW: 12.3 % (ref 11.5–15.5)
WBC: 5.9 10*3/uL (ref 4.0–10.5)
nRBC: 0 % (ref 0.0–0.2)

## 2018-12-01 LAB — TROPONIN I (HIGH SENSITIVITY): Troponin I (High Sensitivity): 5 ng/L (ref <18)

## 2018-12-01 MED ORDER — ONDANSETRON 4 MG PO TBDP
4.0000 mg | ORAL_TABLET | Freq: Once | ORAL | Status: AC
  Administered 2018-12-01: 4 mg via ORAL

## 2018-12-01 MED ORDER — ONDANSETRON 4 MG PO TBDP
4.0000 mg | ORAL_TABLET | Freq: Once | ORAL | Status: AC
  Administered 2018-12-01: 22:00:00 4 mg via ORAL
  Filled 2018-12-01: qty 1

## 2018-12-01 MED ORDER — IPRATROPIUM-ALBUTEROL 0.5-2.5 (3) MG/3ML IN SOLN
3.0000 mL | Freq: Once | RESPIRATORY_TRACT | Status: AC
  Administered 2018-12-01: 3 mL via RESPIRATORY_TRACT
  Filled 2018-12-01: qty 3

## 2018-12-01 MED ORDER — LIDOCAINE VISCOUS HCL 2 % MT SOLN
15.0000 mL | Freq: Once | OROMUCOSAL | Status: AC
  Administered 2018-12-01: 15 mL via ORAL
  Filled 2018-12-01: qty 15

## 2018-12-01 MED ORDER — ONDANSETRON 4 MG PO TBDP
ORAL_TABLET | ORAL | Status: AC
  Filled 2018-12-01: qty 1

## 2018-12-01 MED ORDER — ALUM & MAG HYDROXIDE-SIMETH 200-200-20 MG/5ML PO SUSP
30.0000 mL | Freq: Once | ORAL | Status: AC
  Administered 2018-12-01: 30 mL via ORAL
  Filled 2018-12-01: qty 30

## 2018-12-01 NOTE — ED Provider Notes (Signed)
[facility] Emergency Department Provider Note  ____________________________________________  Time seen: Approximately 11:33 PM  I have reviewed the triage vital signs and the nursing notes.   HISTORY  Chief Complaint Chest Pain   HPI Marcus Small is a 28 y.o. male with a history of hypertension and anxiety who presents for evaluation of chest pain.  Patient was arrested today and is brought into the emergency room for medical clearance before being taken to jail.  Patient reports that he has had a dry cough for 2 weeks associated with intermittent shortness of breath and chest pain.  Describes the pain as a tightness diffusely across his chest which is intermittent, currently moderate, nonradiating.  No fever or chills.  Over the last 2 to 3 days he has had nausea and a couple episodes of nonbloody nonbilious emesis.  No diarrhea, no known exposure to COVID, no abdominal pain, no hematemesis, no melena, no coffee-ground emesis.   Patient is a smoker.  Denies history of asthma.  No personal family history of heart attacks, PE or DVT, recent travel immobilization, leg pain or swelling, hemoptysis, exogenous hormones.  No drug use.  Past Medical History:  Diagnosis Date   Anxiety    Hypertension      Prior to Admission medications   Medication Sig Start Date End Date Taking? Authorizing Provider  albuterol (VENTOLIN HFA) 108 (90 Base) MCG/ACT inhaler Inhale 2 puffs into the lungs every 6 (six) hours as needed for wheezing or shortness of breath. 12/02/18   Rudene Re, MD  Aspirin-Acetaminophen-Caffeine (GOODY HEADACHE PO) Take 1 packet by mouth as needed.    [provider]  ibuprofen (ADVIL,MOTRIN) 800 MG tablet Take 800 mg by mouth every 8 (eight) hours as needed for headache.    [provider]  ketorolac (TORADOL) 10 MG tablet Take 1 tablet (10 mg total) by mouth every 8 (eight) hours as needed. 08/10/15   Gregor Hams, MD   ondansetron (ZOFRAN ODT) 4 MG disintegrating tablet Take 1 tablet (4 mg total) by mouth every 8 (eight) hours as needed. 12/02/18   Rudene Re, MD  predniSONE (DELTASONE) 20 MG tablet Take 3 tablets (60 mg total) by mouth daily for 5 days. 12/02/18 12/07/18  Rudene Re, MD    Allergies Peanut-containing drug products and Shellfish allergy  No family history on file.  Social History Social History   Tobacco Use   Smoking status: Current Every Day Smoker    Packs/day: 0.50    Types: Cigarettes    Last attempt to quit: 04/17/2015    Years since quitting: 3.6   Smokeless tobacco: Never Used  Substance Use Topics   Alcohol use: Yes    Alcohol/week: 18.0 standard drinks    Types: 18 Cans of beer per week    Comment: daily - beer plus 2 pints of liquor a day   Drug use: Not on file    Review of Systems  Constitutional: Negative for fever. Eyes: Negative for visual changes. ENT: Negative for sore throat. Neck: No neck pain  Cardiovascular: + chest tightness. Respiratory: + shortness of breath, cough Gastrointestinal: Negative for abdominal pain,or diarrhea. + nausea and vomiting Genitourinary: Negative for dysuria. Musculoskeletal: Negative for back pain. Skin: Negative for rash. Neurological: Negative for headaches, weakness or numbness. Psych: No SI or HI  ____________________________________________   PHYSICAL EXAM:  VITAL SIGNS: ED Triage Vitals  Enc Vitals Group     BP 12/01/18 2030 (!) 143/84  Pulse Rate 12/01/18 2030 91     Resp 12/01/18 2030 16     Temp 12/01/18 2030 98.9 F (37.2 C)     Temp Source 12/01/18 2030 Oral     SpO2 12/01/18 2030 98 %     Weight 12/01/18 2027 179 lb 14.3 oz (81.6 kg)     Height 12/01/18 2027 5\' 6"  (1.676 m)     Head Circumference --      Peak Flow --      Pain Score 12/01/18 2026 9     Pain Loc --      Pain Edu? --      Excl. in GC? --     Constitutional: Alert and oriented. Well appearing and in no  apparent distress. HEENT:      Head: Normocephalic and atraumatic.         Eyes: Conjunctivae are normal. Sclera is non-icteric.       Mouth/Throat: Mucous membranes are moist.       Neck: Supple with no signs of meningismus. Cardiovascular: Regular rate and rhythm. No murmurs, gallops, or rubs. 2+ symmetrical distal pulses are present in all extremities. No JVD. Respiratory: Normal respiratory effort. Lungs are clear to auscultation bilaterally with slightly decreased air movement. No wheezes, crackles, or rhonchi.  Gastrointestinal: Soft, non tender, and non distended with positive bowel sounds. No rebound or guarding. Musculoskeletal: Nontender with normal range of motion in all extremities. No edema, cyanosis, or erythema of extremities. Neurologic: Normal speech and language. Face is symmetric. Moving all extremities. No gross focal neurologic deficits are appreciated. Skin: Skin is warm, dry and intact. No rash noted. Psychiatric: Mood and affect are normal. Speech and behavior are normal.  ____________________________________________   LABS (all labs ordered are listed, but only abnormal results are displayed)  Labs Reviewed  HEPATIC FUNCTION PANEL - Abnormal; Notable for the following components:      Result Value   AST 99 (*)    ALT 65 (*)    All other components within normal limits  BASIC METABOLIC PANEL  CBC  LIPASE, BLOOD  TROPONIN I (HIGH SENSITIVITY)   ____________________________________________  EKG  ED ECG REPORT I, Nita Sicklearolina Caleigha Zale, the attending physician, personally viewed and interpreted this ECG.  Normal sinus rhythm, rate of 75, normal intervals, normal axis, no ST elevations or depressions.  Normal EKG. ____________________________________________  RADIOLOGY  I have personally reviewed the images performed during this visit and I agree with the Radiologist's read.   Interpretation by Radiologist:  Dg Chest 1 View  Result Date:  12/01/2018 CLINICAL DATA:  Chest pain for 2 weeks. Shortness of breath. EXAM: CHEST  1 VIEW COMPARISON:  Radiograph 06/16/2015 FINDINGS: The cardiomediastinal contours are normal. The lungs are clear. Pulmonary vasculature is normal. No consolidation, pleural effusion, or pneumothorax. No acute osseous abnormalities are seen. IMPRESSION: Negative AP view of the chest. Electronically Signed   By: Narda RutherfordMelanie  Sanford M.D.   On: 12/01/2018 23:18     ____________________________________________   PROCEDURES  Procedure(s) performed: None Procedures Critical Care performed:  None ____________________________________________   INITIAL IMPRESSION / ASSESSMENT AND PLAN / ED COURSE   28 y.o. male with a history of hypertension and anxiety who presents for evaluation of 2 weeks of dry cough, chest tightness, intermittent SOB and now with 3 days of nausea and vomiting.  Patient is well-appearing in no distress with normal vital signs, normal work of breathing, normal sats, lungs are clear to auscultation with slightly decreased air movement bilaterally.  Abdomen is soft with no tenderness throughout.  EKG with no evidence of dysrhythmias or ischemia.  Labs are all within normal limits including negative troponin, no evidence of dehydration or electrolyte abnormalities.  Chest x-ray no evidence of pneumonia, pulmonary edema, pneumothorax.  Most likely viral illness versus bronchitis versus GERD.  Will give Zofran, GI cocktail, and duo neb and reassess    _________________________ 1:19 AM on 12/02/2018 -----------------------------------------  Patient vomited once after GI cocktail but now feels better. Tolerating PO.  Improved air movement on auscultation, remains with normal work of breathing and normal sats.  Will discharge patient home on prednisone and albuterol for bronchitis.  Patient also provided with Zofran for nausea.  Discussed my standard return precautions and follow-up.    As part of my  medical decision making, I reviewed the following data within the electronic MEDICAL RECORD NUMBER Nursing notes reviewed and incorporated, Labs reviewed , EKG interpreted , Old chart reviewed, Radiograph reviewed , Notes from prior ED visits and [place] Controlled Substance Database   Patient was evaluated in Emergency Department today for the symptoms described in the history of present illness. Patient was evaluated in the context of the global COVID-19 pandemic, which necessitated consideration that the patient might be at risk for infection with the SARS-CoV-2 virus that causes COVID-19. Institutional protocols and algorithms that pertain to the evaluation of patients at risk for COVID-19 are in a state of rapid change based on information released by regulatory bodies including the CDC and federal and state organizations. These policies and algorithms were followed during the patient's care in the ED.   ____________________________________________   FINAL CLINICAL IMPRESSION(S) / ED DIAGNOSES   Final diagnoses:  Bronchitis      NEW MEDICATIONS STARTED DURING THIS VISIT:  ED Discharge Orders         Ordered    albuterol (VENTOLIN HFA) 108 (90 Base) MCG/ACT inhaler  Every 6 hours PRN     12/02/18 0118    predniSONE (DELTASONE) 20 MG tablet  Daily     12/02/18 0118    ondansetron (ZOFRAN ODT) 4 MG disintegrating tablet  Every 8 hours PRN     12/02/18 0118           Note:  This document was prepared using Dragon voice recognition software and may include unintentional dictation errors.    Don PerkingVeronese, [place], MD 12/02/18 0120

## 2018-12-01 NOTE — ED Triage Notes (Signed)
Pt to ED for medical clearance for Chest pain x 2 weeks. Pain radiating across entire upper chest 9/10. Pain does not worsen with palpation or specific activities. Pt reporting a cough. NO fevers.

## 2018-12-01 NOTE — ED Notes (Signed)
Pt reporting he is feeling lightheaded and nauseous at this time.

## 2018-12-02 LAB — HEPATIC FUNCTION PANEL
ALT: 65 U/L — ABNORMAL HIGH (ref 0–44)
AST: 99 U/L — ABNORMAL HIGH (ref 15–41)
Albumin: 4.6 g/dL (ref 3.5–5.0)
Alkaline Phosphatase: 67 U/L (ref 38–126)
Bilirubin, Direct: <0.1 mg/dL (ref 0.0–0.2)
Total Bilirubin: 0.6 mg/dL (ref 0.3–1.2)
Total Protein: 8 g/dL (ref 6.5–8.1)

## 2018-12-02 LAB — LIPASE, BLOOD: Lipase: 23 U/L (ref 11–51)

## 2018-12-02 MED ORDER — ALBUTEROL SULFATE HFA 108 (90 BASE) MCG/ACT IN AERS: 2.0000 | INHALATION_SPRAY | Freq: Four times a day (QID) | RESPIRATORY_TRACT | 0 refills | Status: DC | PRN

## 2018-12-02 MED ORDER — PREDNISONE 20 MG PO TABS: 60.0000 mg | ORAL_TABLET | Freq: Every day | ORAL | 0 refills | Status: AC

## 2018-12-02 MED ORDER — ONDANSETRON 4 MG PO TBDP: 4.0000 mg | ORAL_TABLET | Freq: Three times a day (TID) | ORAL | 0 refills | Status: DC | PRN

## 2019-09-05 ENCOUNTER — Encounter: Payer: Self-pay | Admitting: Emergency Medicine

## 2019-09-05 ENCOUNTER — Emergency Department: Payer: Self-pay

## 2019-09-05 ENCOUNTER — Emergency Department
Admission: EM | Admit: 2019-09-05 | Discharge: 2019-09-05 | Disposition: A | Discharge status: Home / Self Care | Payer: Self-pay | Attending: Emergency Medicine | Admitting: Emergency Medicine

## 2019-09-05 ENCOUNTER — Other Ambulatory Visit: Payer: Self-pay

## 2019-09-05 DIAGNOSIS — Z91010 Allergy to peanuts: Secondary | ICD-10-CM | POA: Insufficient documentation

## 2019-09-05 DIAGNOSIS — I10 Essential (primary) hypertension: Secondary | ICD-10-CM | POA: Insufficient documentation

## 2019-09-05 DIAGNOSIS — M67833 Other specified disorders of tendon, right wrist: Secondary | ICD-10-CM | POA: Insufficient documentation

## 2019-09-05 DIAGNOSIS — F17210 Nicotine dependence, cigarettes, uncomplicated: Secondary | ICD-10-CM | POA: Insufficient documentation

## 2019-09-05 DIAGNOSIS — M778 Other enthesopathies, not elsewhere classified: Secondary | ICD-10-CM

## 2019-09-05 MED ORDER — MELOXICAM 15 MG PO TABS: 15.0000 mg | ORAL_TABLET | Freq: Every day | ORAL | 0 refills | Status: DC

## 2019-09-05 MED ORDER — MELOXICAM 7.5 MG PO TABS
15.0000 mg | ORAL_TABLET | Freq: Once | ORAL | Status: AC
  Administered 2019-09-05: 15 mg via ORAL
  Filled 2019-09-05: qty 2

## 2019-09-05 NOTE — ED Provider Notes (Signed)
[facility] Emergency Department Provider Note  ____________________________________________  Time seen: Approximately 10:34 PM  I have reviewed the triage vital signs and the nursing notes.   HISTORY  Chief Complaint Wrist Pain    HPI Marcus Small is a 29 y.o. male who presents the emergency department complaining of right wrist pain.  Patient denies any frank injury but states that he has been having pain along the distal ulna for the past month.  Patient states that at rest there is no pain but if he has movement specifically with flexion and rotation of the wrist he will have pain over the distal ulna.  No edema, erythema is reported.  No chronic medications for this complaint.  No history of same.         Past Medical History:  Diagnosis Date  . Anxiety   . Hypertension     There are no problems to display for this patient.   History reviewed. No pertinent surgical history.  Prior to Admission medications   Medication Sig Start Date End Date Taking? Authorizing Provider  albuterol (VENTOLIN HFA) 108 (90 Base) MCG/ACT inhaler Inhale 2 puffs into the lungs every 6 (six) hours as needed for wheezing or shortness of breath. 12/02/18   Nita Sickle, MD  Aspirin-Acetaminophen-Caffeine (GOODY HEADACHE PO) Take 1 packet by mouth as needed.    [provider]  ibuprofen (ADVIL,MOTRIN) 800 MG tablet Take 800 mg by mouth every 8 (eight) hours as needed for headache.    [provider]  ketorolac (TORADOL) 10 MG tablet Take 1 tablet (10 mg total) by mouth every 8 (eight) hours as needed. 08/10/15   Darci Current, MD  meloxicam (MOBIC) 15 MG tablet Take 1 tablet (15 mg total) by mouth daily. 09/05/19   Maks Cavallero, Delorise Royals, PA-C  ondansetron (ZOFRAN ODT) 4 MG disintegrating tablet Take 1 tablet (4 mg total) by mouth every 8 (eight) hours as needed. 12/02/18   Nita Sickle, MD    Allergies Peanut-containing drug products  and Shellfish allergy  History reviewed. No pertinent family history.  Social History Social History   Tobacco Use  . Smoking status: Current Every Day Smoker    Packs/day: 0.50    Types: Cigarettes    Last attempt to quit: 04/17/2015    Years since quitting: 4.3  . Smokeless tobacco: Never Used  Substance Use Topics  . Alcohol use: Yes    Alcohol/week: 18.0 standard drinks    Types: 18 Cans of beer per week    Comment: daily - beer plus 2 pints of liquor a day  . Drug use: Not on file     Review of Systems  Constitutional: No fever/chills Eyes: No visual changes. No discharge ENT: No upper respiratory complaints. Cardiovascular: no chest pain. Respiratory: no cough. No SOB. Gastrointestinal: No abdominal pain.  No nausea, no vomiting.  No diarrhea.  No constipation. Musculoskeletal: Ongoing nontraumatic right wrist pain x1 month Skin: Negative for rash, abrasions, lacerations, ecchymosis. Neurological: Negative for headaches, focal weakness or numbness. 10-point ROS otherwise negative.  ____________________________________________   PHYSICAL EXAM:  VITAL SIGNS: ED Triage Vitals  Enc Vitals Group     BP 09/05/19 2106 (!) 154/89     Pulse Rate 09/05/19 2106 100     Resp 09/05/19 2106 18     Temp 09/05/19 2106 99.1 F (37.3 C)     Temp Source 09/05/19 2106 Oral     SpO2 09/05/19 2106 97 %  Weight 09/05/19 2104 200 lb (90.7 kg)     Height 09/05/19 2104 5\' 5"  (1.651 m)     Head Circumference --      Peak Flow --      Pain Score 09/05/19 2104 10     Pain Loc --      Pain Edu? --      Excl. in [place]? --      Constitutional: Alert and oriented. Well appearing and in no acute distress. Eyes: Conjunctivae are normal. PERRL. EOMI. Head: Atraumatic. ENT:      Ears:       Nose: No congestion/rhinnorhea.      Mouth/Throat: Mucous membranes are moist.  Neck: No stridor.    Cardiovascular: Normal rate, regular rhythm. Normal S1 and S2.  Good peripheral  circulation. Respiratory: Normal respiratory effort without tachypnea or retractions. Lungs CTAB. Good air entry to the bases with no decreased or absent breath sounds. Musculoskeletal: Full range of motion to all extremities. No gross deformities appreciated.  Visualization of the right wrist reveals no visible signs of trauma.  No edema, erythema, ecchymosis.  Full range of motion.  Patient is mildly tender to palpation over the distal ulna.  No other tenderness.  No palpable abnormalities.  Sensation intact all digits.  Capillary refill less than 2 seconds all digits. Neurologic:  Normal speech and language. No gross focal neurologic deficits are appreciated.  Skin:  Skin is warm, dry and intact. No rash noted. Psychiatric: Mood and affect are normal. Speech and behavior are normal. Patient exhibits appropriate insight and judgement.   ____________________________________________   LABS (all labs ordered are listed, but only abnormal results are displayed)  Labs Reviewed - No data to display ____________________________________________  EKG   ____________________________________________  RADIOLOGY I personally viewed and evaluated these images as part of my medical decision making, as well as reviewing the written report by the radiologist.  DG Wrist Complete Right  Result Date: 09/05/2019 CLINICAL DATA:  Initial evaluation for acute pain status post recent injury. EXAM: RIGHT WRIST - COMPLETE 3+ VIEW COMPARISON:  None. FINDINGS: There is no evidence of fracture or dislocation. There is no evidence of arthropathy or other focal bone abnormality. Soft tissues are unremarkable. IMPRESSION: No acute or subacute osseous abnormality about the wrist. Electronically Signed   By: Jeannine Boga M.D.   On: 09/05/2019 21:41    ____________________________________________    PROCEDURES  Procedure(s) performed:    Procedures    Medications  meloxicam (MOBIC) tablet 15 mg (15  mg Oral Given 09/05/19 2302)     ____________________________________________   INITIAL IMPRESSION / ASSESSMENT AND PLAN / ED COURSE  Pertinent labs & imaging results that were available during my care of the patient were reviewed by me and considered in my medical decision making (see chart for details).  Review of the [place] CSRS was performed in accordance of the [place] prior to dispensing any controlled drugs.           Patient's diagnosis is consistent with tendinitis of the wrist.  Patient presented to emergency department with complaints of wrist pain x1 month.  No acute visible signs of trauma.  Slightly tender over the dorsal wrist over the distal ulna.  Imaging is reassuring with no acute findings.  Given patient's symptoms, physical exam patient has a diagnosis of tendinitis of the wrist.  He is given a Velcro wrist brace and placed on anti-inflammatories.  Follow-up with primary care or orthopedics as needed..  Patient is  given ED precautions to return to the ED for any worsening or new symptoms.     ____________________________________________  FINAL CLINICAL IMPRESSION(S) / ED DIAGNOSES  Final diagnoses:  Tendinitis of right wrist      NEW MEDICATIONS STARTED DURING THIS VISIT:  ED Discharge Orders         Ordered    meloxicam (MOBIC) 15 MG tablet  Daily     09/05/19 2243              This chart was dictated using voice recognition software/Dragon. Despite best efforts to proofread, errors can occur which can change the meaning. Any change was purely unintentional.    Racheal Patches, PA-C 09/05/19 2315    Sharman Cheek, MD 09/05/19 [phone]

## 2019-09-05 NOTE — ED Triage Notes (Signed)
Pt hurt right wrist one month ago; jammed it on wall.  Still having pain. No deformity.

## 2020-06-11 ENCOUNTER — Inpatient Hospital Stay
Admission: EM | Admit: 2020-06-11 | Discharge: 2020-06-26 | DRG: 896 | Disposition: A | Discharge status: Home / Self Care | Payer: Self-pay | Attending: Internal Medicine | Admitting: Internal Medicine

## 2020-06-11 ENCOUNTER — Emergency Department: Payer: Self-pay

## 2020-06-11 ENCOUNTER — Other Ambulatory Visit: Payer: Self-pay

## 2020-06-11 DIAGNOSIS — J9811 Atelectasis: Secondary | ICD-10-CM | POA: Diagnosis present

## 2020-06-11 DIAGNOSIS — D72829 Elevated white blood cell count, unspecified: Secondary | ICD-10-CM

## 2020-06-11 DIAGNOSIS — F32A Depression, unspecified: Secondary | ICD-10-CM | POA: Diagnosis present

## 2020-06-11 DIAGNOSIS — Z79899 Other long term (current) drug therapy: Secondary | ICD-10-CM

## 2020-06-11 DIAGNOSIS — N179 Acute kidney failure, unspecified: Secondary | ICD-10-CM

## 2020-06-11 DIAGNOSIS — X58XXXA Exposure to other specified factors, initial encounter: Secondary | ICD-10-CM | POA: Diagnosis present

## 2020-06-11 DIAGNOSIS — J9602 Acute respiratory failure with hypercapnia: Secondary | ICD-10-CM | POA: Diagnosis not present

## 2020-06-11 DIAGNOSIS — R45851 Suicidal ideations: Secondary | ICD-10-CM | POA: Diagnosis present

## 2020-06-11 DIAGNOSIS — Z6837 Body mass index (BMI) 37.0-37.9, adult: Secondary | ICD-10-CM

## 2020-06-11 DIAGNOSIS — R509 Fever, unspecified: Secondary | ICD-10-CM

## 2020-06-11 DIAGNOSIS — Z4659 Encounter for fitting and adjustment of other gastrointestinal appliance and device: Secondary | ICD-10-CM

## 2020-06-11 DIAGNOSIS — F10931 Alcohol use, unspecified with withdrawal delirium: Secondary | ICD-10-CM

## 2020-06-11 DIAGNOSIS — Z7289 Other problems related to lifestyle: Secondary | ICD-10-CM

## 2020-06-11 DIAGNOSIS — E669 Obesity, unspecified: Secondary | ICD-10-CM | POA: Diagnosis present

## 2020-06-11 DIAGNOSIS — I248 Other forms of acute ischemic heart disease: Secondary | ICD-10-CM | POA: Diagnosis present

## 2020-06-11 DIAGNOSIS — Z978 Presence of other specified devices: Secondary | ICD-10-CM

## 2020-06-11 DIAGNOSIS — E876 Hypokalemia: Secondary | ICD-10-CM | POA: Diagnosis present

## 2020-06-11 DIAGNOSIS — R778 Other specified abnormalities of plasma proteins: Secondary | ICD-10-CM

## 2020-06-11 DIAGNOSIS — J9690 Respiratory failure, unspecified, unspecified whether with hypoxia or hypercapnia: Secondary | ICD-10-CM

## 2020-06-11 DIAGNOSIS — J9601 Acute respiratory failure with hypoxia: Secondary | ICD-10-CM | POA: Diagnosis not present

## 2020-06-11 DIAGNOSIS — F10939 Alcohol use, unspecified with withdrawal, unspecified: Secondary | ICD-10-CM

## 2020-06-11 DIAGNOSIS — K567 Ileus, unspecified: Secondary | ICD-10-CM

## 2020-06-11 DIAGNOSIS — T888XXA Other specified complications of surgical and medical care, not elsewhere classified, initial encounter: Secondary | ICD-10-CM

## 2020-06-11 DIAGNOSIS — Z20822 Contact with and (suspected) exposure to covid-19: Secondary | ICD-10-CM | POA: Diagnosis present

## 2020-06-11 DIAGNOSIS — L89812 Pressure ulcer of head, stage 2: Secondary | ICD-10-CM | POA: Diagnosis not present

## 2020-06-11 DIAGNOSIS — J690 Pneumonitis due to inhalation of food and vomit: Secondary | ICD-10-CM | POA: Diagnosis present

## 2020-06-11 DIAGNOSIS — S43005A Unspecified dislocation of left shoulder joint, initial encounter: Secondary | ICD-10-CM

## 2020-06-11 DIAGNOSIS — E872 Acidosis: Secondary | ICD-10-CM | POA: Diagnosis present

## 2020-06-11 DIAGNOSIS — E871 Hypo-osmolality and hyponatremia: Secondary | ICD-10-CM | POA: Diagnosis present

## 2020-06-11 DIAGNOSIS — D649 Anemia, unspecified: Secondary | ICD-10-CM | POA: Diagnosis present

## 2020-06-11 DIAGNOSIS — N170 Acute kidney failure with tubular necrosis: Secondary | ICD-10-CM | POA: Diagnosis present

## 2020-06-11 DIAGNOSIS — I10 Essential (primary) hypertension: Secondary | ICD-10-CM | POA: Diagnosis present

## 2020-06-11 DIAGNOSIS — G9349 Other encephalopathy: Secondary | ICD-10-CM | POA: Diagnosis present

## 2020-06-11 DIAGNOSIS — Z7982 Long term (current) use of aspirin: Secondary | ICD-10-CM

## 2020-06-11 DIAGNOSIS — S42252A Displaced fracture of greater tuberosity of left humerus, initial encounter for closed fracture: Secondary | ICD-10-CM | POA: Diagnosis present

## 2020-06-11 DIAGNOSIS — D7589 Other specified diseases of blood and blood-forming organs: Secondary | ICD-10-CM | POA: Diagnosis present

## 2020-06-11 DIAGNOSIS — R000 Tachycardia, unspecified: Secondary | ICD-10-CM

## 2020-06-11 DIAGNOSIS — L8990 Pressure ulcer of unspecified site, unspecified stage: Secondary | ICD-10-CM | POA: Insufficient documentation

## 2020-06-11 DIAGNOSIS — K852 Alcohol induced acute pancreatitis without necrosis or infection: Secondary | ICD-10-CM | POA: Diagnosis present

## 2020-06-11 DIAGNOSIS — E43 Unspecified severe protein-calorie malnutrition: Secondary | ICD-10-CM | POA: Diagnosis present

## 2020-06-11 DIAGNOSIS — Z789 Other specified health status: Secondary | ICD-10-CM

## 2020-06-11 DIAGNOSIS — Z9289 Personal history of other medical treatment: Secondary | ICD-10-CM

## 2020-06-11 DIAGNOSIS — F419 Anxiety disorder, unspecified: Secondary | ICD-10-CM | POA: Diagnosis present

## 2020-06-11 DIAGNOSIS — Y900 Blood alcohol level of less than 20 mg/100 ml: Secondary | ICD-10-CM | POA: Diagnosis present

## 2020-06-11 DIAGNOSIS — F17210 Nicotine dependence, cigarettes, uncomplicated: Secondary | ICD-10-CM | POA: Diagnosis present

## 2020-06-11 DIAGNOSIS — F10930 Alcohol use, unspecified with withdrawal, uncomplicated: Secondary | ICD-10-CM

## 2020-06-11 DIAGNOSIS — Z91013 Allergy to seafood: Secondary | ICD-10-CM

## 2020-06-11 DIAGNOSIS — F10230 Alcohol dependence with withdrawal, uncomplicated: Secondary | ICD-10-CM

## 2020-06-11 DIAGNOSIS — A419 Sepsis, unspecified organism: Secondary | ICD-10-CM

## 2020-06-11 DIAGNOSIS — F10239 Alcohol dependence with withdrawal, unspecified: Secondary | ICD-10-CM

## 2020-06-11 DIAGNOSIS — E870 Hyperosmolality and hypernatremia: Secondary | ICD-10-CM | POA: Diagnosis not present

## 2020-06-11 DIAGNOSIS — Z8249 Family history of ischemic heart disease and other diseases of the circulatory system: Secondary | ICD-10-CM

## 2020-06-11 DIAGNOSIS — R188 Other ascites: Secondary | ICD-10-CM | POA: Diagnosis present

## 2020-06-11 DIAGNOSIS — E8729 Other acidosis: Secondary | ICD-10-CM

## 2020-06-11 DIAGNOSIS — Z791 Long term (current) use of non-steroidal anti-inflammatories (NSAID): Secondary | ICD-10-CM

## 2020-06-11 DIAGNOSIS — R569 Unspecified convulsions: Principal | ICD-10-CM

## 2020-06-11 DIAGNOSIS — F10231 Alcohol dependence with withdrawal delirium: Principal | ICD-10-CM | POA: Diagnosis present

## 2020-06-11 DIAGNOSIS — K859 Acute pancreatitis without necrosis or infection, unspecified: Secondary | ICD-10-CM

## 2020-06-11 LAB — CBC WITH DIFFERENTIAL/PLATELET
Abs Immature Granulocytes: 0.1 10*3/uL — ABNORMAL HIGH (ref 0.00–0.07)
Basophils Absolute: 0.1 10*3/uL (ref 0.0–0.1)
Basophils Relative: 0 %
Eosinophils Absolute: 0 10*3/uL (ref 0.0–0.5)
Eosinophils Relative: 0 %
HCT: 45.6 % (ref 39.0–52.0)
Hemoglobin: 15 g/dL (ref 13.0–17.0)
Immature Granulocytes: 0 %
Lymphocytes Relative: 3 %
Lymphs Abs: 0.6 10*3/uL — ABNORMAL LOW (ref 0.7–4.0)
MCH: 30.2 pg (ref 26.0–34.0)
MCHC: 32.9 g/dL (ref 30.0–36.0)
MCV: 91.9 fL (ref 80.0–100.0)
Monocytes Absolute: 1 10*3/uL (ref 0.1–1.0)
Monocytes Relative: 4 %
Neutro Abs: 21.7 10*3/uL — ABNORMAL HIGH (ref 1.7–7.7)
Neutrophils Relative %: 93 %
Platelets: 359 10*3/uL (ref 150–400)
RBC: 4.96 MIL/uL (ref 4.22–5.81)
RDW: 11.9 % (ref 11.5–15.5)
WBC: 23.5 10*3/uL — ABNORMAL HIGH (ref 4.0–10.5)
nRBC: 0 % (ref 0.0–0.2)

## 2020-06-11 LAB — RESP PANEL BY RT-PCR (FLU A&B, COVID) ARPGX2
Influenza A by PCR: NEGATIVE
Influenza B by PCR: NEGATIVE
SARS Coronavirus 2 by RT PCR: NEGATIVE

## 2020-06-11 LAB — MAGNESIUM: Magnesium: 2.2 mg/dL (ref 1.7–2.4)

## 2020-06-11 LAB — COMPREHENSIVE METABOLIC PANEL
ALT: 21 U/L (ref 0–44)
AST: 71 U/L — ABNORMAL HIGH (ref 15–41)
Albumin: 4.3 g/dL (ref 3.5–5.0)
Alkaline Phosphatase: 89 U/L (ref 38–126)
Anion gap: 17 — ABNORMAL HIGH (ref 5–15)
BUN: 9 mg/dL (ref 6–20)
CO2: 19 mmol/L — ABNORMAL LOW (ref 22–32)
Calcium: 9.2 mg/dL (ref 8.9–10.3)
Chloride: 102 mmol/L (ref 98–111)
Creatinine, Ser: 1.55 mg/dL — ABNORMAL HIGH (ref 0.61–1.24)
GFR, Estimated: >60 mL/min (ref >60)
Glucose, Bld: 148 mg/dL — ABNORMAL HIGH (ref 70–99)
Potassium: 2.8 mmol/L — ABNORMAL LOW (ref 3.5–5.1)
Sodium: 138 mmol/L (ref 135–145)
Total Bilirubin: 1.3 mg/dL — ABNORMAL HIGH (ref 0.3–1.2)
Total Protein: 7.9 g/dL (ref 6.5–8.1)

## 2020-06-11 LAB — LIPASE, BLOOD: Lipase: 629 U/L — ABNORMAL HIGH (ref 11–51)

## 2020-06-11 LAB — ETHANOL: Alcohol, Ethyl (B): <10 mg/dL (ref <10)

## 2020-06-11 LAB — TROPONIN I (HIGH SENSITIVITY): Troponin I (High Sensitivity): 93 ng/L — ABNORMAL HIGH (ref <18)

## 2020-06-11 MED ORDER — THIAMINE HCL 100 MG/ML IJ SOLN: 100.0000 mg | Freq: Every day | INTRAMUSCULAR | Status: DC

## 2020-06-11 MED ORDER — VANCOMYCIN HCL IN DEXTROSE 1-5 GM/200ML-% IV SOLN: 1000.0000 mg | Freq: Once | INTRAVENOUS | Status: DC

## 2020-06-11 MED ORDER — THIAMINE HCL 100 MG/ML IJ SOLN
100.0000 mg | Freq: Once | INTRAMUSCULAR | Status: AC
  Administered 2020-06-11: 100 mg via INTRAVENOUS
  Filled 2020-06-11: qty 2

## 2020-06-11 MED ORDER — LORAZEPAM 2 MG/ML IJ SOLN: 0.0000 mg | Freq: Four times a day (QID) | INTRAMUSCULAR | Status: DC

## 2020-06-11 MED ORDER — THIAMINE HCL 100 MG PO TABS: 100.0000 mg | ORAL_TABLET | Freq: Every day | ORAL | Status: DC

## 2020-06-11 MED ORDER — SODIUM CHLORIDE 0.9 % IV SOLN
2.0000 g | Freq: Once | INTRAVENOUS | Status: AC
  Administered 2020-06-12: 2 g via INTRAVENOUS
  Filled 2020-06-11: qty 2

## 2020-06-11 MED ORDER — LORAZEPAM 2 MG/ML IJ SOLN
2.0000 mg | Freq: Once | INTRAMUSCULAR | Status: AC
  Administered 2020-06-11: 2 mg via INTRAVENOUS
  Filled 2020-06-11: qty 1

## 2020-06-11 MED ORDER — METRONIDAZOLE IN NACL 5-0.79 MG/ML-% IV SOLN
500.0000 mg | Freq: Once | INTRAVENOUS | Status: AC
  Administered 2020-06-12: 500 mg via INTRAVENOUS
  Filled 2020-06-11: qty 100

## 2020-06-11 MED ORDER — LORAZEPAM 2 MG/ML IJ SOLN
2.0000 mg | Freq: Once | INTRAMUSCULAR | Status: AC
  Administered 2020-06-12: 2 mg via INTRAVENOUS
  Filled 2020-06-11: qty 1

## 2020-06-11 MED ORDER — LORAZEPAM 2 MG/ML IJ SOLN: 0.0000 mg | Freq: Two times a day (BID) | INTRAMUSCULAR | Status: DC

## 2020-06-11 MED ORDER — SODIUM CHLORIDE 0.9 % IV BOLUS
1000.0000 mL | Freq: Once | INTRAVENOUS | Status: AC
  Administered 2020-06-11: 1000 mL via INTRAVENOUS

## 2020-06-11 MED ORDER — IOHEXOL 300 MG/ML  SOLN
100.0000 mL | Freq: Once | INTRAMUSCULAR | Status: AC | PRN
  Administered 2020-06-11: 100 mL via INTRAVENOUS

## 2020-06-11 MED ORDER — LACTATED RINGERS IV BOLUS
1000.0000 mL | Freq: Once | INTRAVENOUS | Status: AC
  Administered 2020-06-12: 1000 mL via INTRAVENOUS

## 2020-06-11 MED ORDER — LORAZEPAM 2 MG PO TABS: 0.0000 mg | ORAL_TABLET | Freq: Two times a day (BID) | ORAL | Status: DC

## 2020-06-11 MED ORDER — LORAZEPAM 2 MG PO TABS: 0.0000 mg | ORAL_TABLET | Freq: Four times a day (QID) | ORAL | Status: DC

## 2020-06-11 MED ORDER — LACTATED RINGERS IV BOLUS
1000.0000 mL | Freq: Once | INTRAVENOUS | Status: AC
  Administered 2020-06-11: 1000 mL via INTRAVENOUS

## 2020-06-11 NOTE — ED Notes (Signed)
Seizure pads placed on bad. Precautions in place.

## 2020-06-11 NOTE — ED Provider Notes (Signed)
[facility] Emergency Department Provider Note  ____________________________________________   Event Date/Time   First MD Initiated Contact with Patient 06/11/20 2111     (approximate)  I have reviewed the triage vital signs and the nursing notes.   HISTORY  Chief Complaint Seizures    HPI Marcus Small is a 30 y.o. male with hypertension, anxiety who comes in for seizure.  Patient is a heavy drinker.  Stopped again 2 days ago.  Patient was laying around the house and had some left shoulder pain.  Then family noted a few minute witnessed tonic-clonic seizure.  No history of seizures previously.  Patient was post ictal upon EMS arrival.  No medications were given.  Patient's complaint of left shoulder pain constant, worse with movement, better at rest as well as upper abdominal pain that is severe in nature.   States that he stopped drinking alcohol secondary to the abdominal pain          Past Medical History:  Diagnosis Date  . Anxiety   . Hypertension     There are no problems to display for this patient.   No past surgical history on file.  Prior to Admission medications   Medication Sig Start Date End Date Taking? Authorizing Provider  albuterol (VENTOLIN HFA) 108 (90 Base) MCG/ACT inhaler Inhale 2 puffs into the lungs every 6 (six) hours as needed for wheezing or shortness of breath. 12/02/18   Nita Sickle, MD  Aspirin-Acetaminophen-Caffeine (GOODY HEADACHE PO) Take 1 packet by mouth as needed.    [provider]  ibuprofen (ADVIL,MOTRIN) 800 MG tablet Take 800 mg by mouth every 8 (eight) hours as needed for headache.    [provider]  ketorolac (TORADOL) 10 MG tablet Take 1 tablet (10 mg total) by mouth every 8 (eight) hours as needed. 08/10/15   Darci Current, MD  meloxicam (MOBIC) 15 MG tablet Take 1 tablet (15 mg total) by mouth daily. 09/05/19   Cuthriell, Delorise Royals, PA-C  ondansetron (ZOFRAN ODT) 4 MG  disintegrating tablet Take 1 tablet (4 mg total) by mouth every 8 (eight) hours as needed. 12/02/18   Nita Sickle, MD    Allergies Peanut-containing drug products and Shellfish allergy  No family history on file.  Social History Social History   Tobacco Use  . Smoking status: Current Every Day Smoker    Packs/day: 0.50    Types: Cigarettes    Last attempt to quit: 04/17/2015    Years since quitting: 5.1  . Smokeless tobacco: Never Used  Substance Use Topics  . Alcohol use: Yes    Alcohol/week: 18.0 standard drinks    Types: 18 Cans of beer per week    Comment: daily - beer plus 2 pints of liquor a day      Review of Systems Constitutional: No fever/chills Eyes: No visual changes. ENT: No sore throat. Cardiovascular: Denies chest pain. Respiratory: Denies shortness of breath. Gastrointestinal: Positive abdominal pain no nausea, no vomiting.  No diarrhea.  No constipation. Genitourinary: Negative for dysuria. Musculoskeletal: Negative for back pain.  Positive left shoulder pain Skin: Negative for rash. Neurological: Negative for headaches, focal weakness or numbness.  Positive seizure-like activity All other ROS negative ____________________________________________   PHYSICAL EXAM:  VITAL SIGNS: Blood pressure (!) 165/115, pulse (!) 120, temperature 97.8 F (36.6 C), temperature source Oral, resp. rate (!) 30, height  (1.651 m), weight 90 kg, SpO2 100 %.  Constitutional: Alert and oriented.  Somewhat confused  however Eyes: Conjunctivae are normal. EOMI. Head: Atraumatic. Nose: No congestion/rhinnorhea. Mouth/Throat: Mucous membranes are moist.   Neck: No stridor. Trachea Midline. FROM Cardiovascular: tachy regular rhythm. Grossly normal heart sounds.  Good peripheral circulation. Respiratory: Normal respiratory effort.  No retractions. Lungs CTAB. Gastrointestinal: Tenderness throughout the abdomen.  No distention. No abdominal bruits.  Musculoskeletal:  Left shoulder pain with 2+ distal pulse.  No joint effusions. Neurologic:  Normal speech and language. No gross focal neurologic deficits are appreciated.  Equal strength in arms and legs. Skin:  Skin is warm, dry and intact. No rash noted. Psychiatric: Mood and affect are normal. Speech and behavior are normal. GU: Deferred   ____________________________________________   LABS (all labs ordered are listed, but only abnormal results are displayed)  Labs Reviewed  CBC WITH DIFFERENTIAL/PLATELET - Abnormal; Notable for the following components:      Result Value   WBC 23.5 (*)    Neutro Abs 21.7 (*)    Lymphs Abs 0.6 (*)    Abs Immature Granulocytes 0.10 (*)    All other components within normal limits  COMPREHENSIVE METABOLIC PANEL - Abnormal; Notable for the following components:   Potassium 2.8 (*)    CO2 19 (*)    Glucose, Bld 148 (*)    Creatinine, Ser 1.55 (*)    AST 71 (*)    Total Bilirubin 1.3 (*)    Anion gap 17 (*)    All other components within normal limits  LIPASE, BLOOD - Abnormal; Notable for the following components:   Lipase 629 (*)    All other components within normal limits  TROPONIN I (HIGH SENSITIVITY) - Abnormal; Notable for the following components:   Troponin I (High Sensitivity) 93 (*)    All other components within normal limits  RESP PANEL BY RT-PCR (FLU A&B, COVID) ARPGX2  CULTURE, BLOOD (ROUTINE X 2)  CULTURE, BLOOD (ROUTINE X 2)  MAGNESIUM  ETHANOL  URINALYSIS, COMPLETE (UACMP) WITH MICROSCOPIC  URINE DRUG SCREEN, QUALITATIVE ([facility] ONLY)  TROPONIN I (HIGH SENSITIVITY)   ____________________________________________   ED ECG REPORT I, Concha Se, the attending physician, personally viewed and interpreted this ECG.  EKG sinus tachycardia rate of 125, no ST elevation, no T wave inversions except for lead III, normal intervals ____________________________________________  RADIOLOGY   Official radiology report(s): CT Head Wo  Contrast  Result Date: 06/11/2020 CLINICAL DATA:  Seizure post alcohol withdrawal EXAM: CT CERVICAL SPINE WITHOUT CONTRAST TECHNIQUE: Multidetector CT imaging of the cervical spine was performed without intravenous contrast. Multiplanar CT image reconstructions were also generated. COMPARISON:  None. FINDINGS: Brain: No evidence of acute territorial infarction, hemorrhage, hydrocephalus,extra-axial collection or mass lesion/mass effect. Normal gray-white differentiation. Ventricles are normal in size and contour. Vascular: No hyperdense vessel or unexpected calcification. Skull: The skull is intact. No fracture or focal lesion identified. Sinuses/Orbits: The visualized paranasal sinuses and mastoid air cells are clear. The orbits and globes intact. Other: None Cervical spine: Alignment: Physiologic Skull base and vertebrae: Visualized skull base is intact. No atlanto-occipital dissociation. The vertebral body heights are well maintained. No fracture or pathologic osseous lesion seen. Soft tissues and spinal canal: The visualized paraspinal soft tissues are unremarkable. No prevertebral soft tissue swelling is seen. The spinal canal is grossly unremarkable, no large epidural collection or significant canal narrowing. Disc levels:  No significant canal or neural foraminal narrowing. Upper chest: The lung apices are clear. Thoracic inlet is within normal limits. Other: None IMPRESSION: No acute intracranial abnormality. No acute fracture  or malalignment of the spine. Electronically Signed   By: Jonna Clark M.D.   On: 06/11/2020 23:41   CT Cervical Spine Wo Contrast  Result Date: 06/11/2020 CLINICAL DATA:  Seizure post alcohol withdrawal EXAM: CT CERVICAL SPINE WITHOUT CONTRAST TECHNIQUE: Multidetector CT imaging of the cervical spine was performed without intravenous contrast. Multiplanar CT image reconstructions were also generated. COMPARISON:  None. FINDINGS: Brain: No evidence of acute territorial  infarction, hemorrhage, hydrocephalus,extra-axial collection or mass lesion/mass effect. Normal gray-white differentiation. Ventricles are normal in size and contour. Vascular: No hyperdense vessel or unexpected calcification. Skull: The skull is intact. No fracture or focal lesion identified. Sinuses/Orbits: The visualized paranasal sinuses and mastoid air cells are clear. The orbits and globes intact. Other: None Cervical spine: Alignment: Physiologic Skull base and vertebrae: Visualized skull base is intact. No atlanto-occipital dissociation. The vertebral body heights are well maintained. No fracture or pathologic osseous lesion seen. Soft tissues and spinal canal: The visualized paraspinal soft tissues are unremarkable. No prevertebral soft tissue swelling is seen. The spinal canal is grossly unremarkable, no large epidural collection or significant canal narrowing. Disc levels:  No significant canal or neural foraminal narrowing. Upper chest: The lung apices are clear. Thoracic inlet is within normal limits. Other: None IMPRESSION: No acute intracranial abnormality. No acute fracture or malalignment of the spine. Electronically Signed   By: Jonna Clark M.D.   On: 06/11/2020 23:41   CT ABDOMEN PELVIS W CONTRAST  Result Date: 06/11/2020 CLINICAL DATA:  Abdominal distension. EXAM: CT ABDOMEN AND PELVIS WITH CONTRAST TECHNIQUE: Multidetector CT imaging of the abdomen and pelvis was performed using the standard protocol following bolus administration of intravenous contrast. CONTRAST:  OMNIPAQUE IOHEXOL 300 MG/ML  SOLN COMPARISON:  None. FINDINGS: Lower chest: There are trace bilateral pleural effusions.The heart size is mildly enlarged. Hepatobiliary: The liver is normal. Normal gallbladder.There is no biliary ductal dilation. Pancreas: There is peripancreatic fat stranding. The pancreas appears to be uniformly enhancing. Spleen: Unremarkable. Adrenals/Urinary Tract: --Adrenal glands: Unremarkable.  --Right kidney/ureter: No hydronephrosis or radiopaque kidney stones. --Left kidney/ureter: No hydronephrosis or radiopaque kidney stones. --Urinary bladder: The urinary bladder is distended. Stomach/Bowel: --Stomach/Duodenum: No hiatal hernia or other gastric abnormality. Normal duodenal course and caliber. --Small bowel: Unremarkable. --Colon: Unremarkable. --Appendix: Normal. Vascular/Lymphatic: Normal course and caliber of the major abdominal vessels. --No retroperitoneal lymphadenopathy. --No mesenteric lymphadenopathy. --No pelvic or inguinal lymphadenopathy. Reproductive: Unremarkable Other: There is retroperitoneal free fluid. There is a small volume intraperitoneal free fluid. Musculoskeletal. No acute displaced fractures. IMPRESSION: 1. Findings consistent with acute interstitial pancreatitis. Correlation with lipase is recommended. 2. Small volume intraperitoneal and retroperitoneal free fluid. 3. Trace bilateral pleural effusions. 4. Distended urinary bladder. Electronically Signed   By: Katherine Mantle M.D.   On: 06/11/2020 23:41    ____________________________________________   PROCEDURES  Procedure(s) performed (including Critical Care):  .1-3 Lead EKG Interpretation Performed by: Concha Se, MD Authorized by: Concha Se, MD     Interpretation: abnormal     ECG rate:  120s    ECG rate assessment: tachycardic     Rhythm: sinus rhythm     Ectopy: none     Conduction: normal   .Critical Care Performed by: Concha Se, MD Authorized by: Concha Se, MD   Critical care provider statement:    Critical care time (minutes):  45   Critical care was necessary to treat or prevent imminent or life-threatening deterioration of the following conditions:  Sepsis and CNS failure  or compromise   Critical care was time spent personally by me on the following activities:  Discussions with consultants, evaluation of patient's response to treatment, examination of patient, ordering  and performing treatments and interventions, ordering and review of laboratory studies, ordering and review of radiographic studies, pulse oximetry, re-evaluation of patient's condition, obtaining history from patient or surrogate and review of old charts     ____________________________________________   INITIAL IMPRESSION / ASSESSMENT AND PLAN / ED COURSE  Clinton SawyerJacques D Mcbrearty was evaluated in Emergency Department on 06/11/2020 for the symptoms described in the history of present illness. He was evaluated in the context of the global COVID-19 pandemic, which necessitated consideration that the patient might be at risk for infection with the SARS-CoV-2 virus that causes COVID-19. Institutional protocols and algorithms that pertain to the evaluation of patients at risk for COVID-19 are in a state of rapid change based on information released by regulatory bodies including the CDC and federal and state organizations. These policies and algorithms were followed during the patient's care in the ED.     Patient is a 30 year old male who comes in with significant tachycardia and seizure in the setting of alcohol withdrawal.  Will get CT head to evaluate for intracranial hemorrhage, mass.  CT cervical evaluate for cervical fracture.  Patient is some abdominal tenderness will get CT abdomen to evaluate for acute abdominal process.  Will get labs evaluate for Electra abnormalities, AKI.  Will start patient on 2 L of fluid as well as give 2 mg of Ativan and closely monitor his alcohol withdrawal.  X-rays to evaluate his shoulder.  CT shows pancretitis. White count elevated and meets sirs criteria . Lower suspicion for sepsis but will start broad spectrum antibiotics.   Xray should fracture/dislocated. D/w Dr. Allena Katzpatel recommends attempt at reduction and CT  D/w hospital team for admission         ____________________________________________   FINAL CLINICAL IMPRESSION(S) / ED DIAGNOSES   Final  diagnoses:  Seizure (HCC)  Alcohol withdrawal syndrome without complication (HCC)  Acute pancreatitis, unspecified complication status, unspecified pancreatitis type  Sepsis, due to unspecified organism, unspecified whether acute organ dysfunction present (HCC)  Shoulder dislocation, left, initial encounter      MEDICATIONS GIVEN DURING THIS VISIT:  Medications  albuterol (VENTOLIN HFA) 108 (90 Base) MCG/ACT inhaler 2 puff (has no administration in time range)  enoxaparin (LOVENOX) injection 45 mg (45 mg Subcutaneous Given 06/12/20 0959)  sodium chloride flush (NS) 0.9 % injection 3 mL (3 mLs Intravenous Given 06/12/20 1001)  0.9 %  sodium chloride infusion ( Intravenous Infusion Verify 06/12/20 0800)  potassium chloride 10 mEq in 100 mL IVPB (10 mEq Intravenous Not Given 06/12/20 0842)  labetalol (NORMODYNE) injection 5 mg (5 mg Intravenous Given 06/12/20 0421)  LORazepam (ATIVAN) tablet 1-4 mg (has no administration in time range)    Or  LORazepam (ATIVAN) injection 1-4 mg (has no administration in time range)  thiamine tablet 100 mg ( Oral See Alternative 06/12/20 0959)    Or  thiamine (B-1) injection 100 mg (100 mg Intravenous Given 06/12/20 0959)  folic acid (FOLVITE) tablet 1 mg (1 mg Oral Given 06/12/20 0959)  multivitamin with minerals tablet 1 tablet (1 tablet Oral Given 06/12/20 0959)  propofol (DIPRIVAN) 10 mg/mL bolus/IV push 90 mg (90 mg Intravenous Not Given 06/12/20 0252)  Chlorhexidine Gluconate Cloth 2 % PADS 6 each (6 each Topical Given 06/12/20 0958)  HYDROmorphone (DILAUDID) injection 0.5 mg (0.5 mg Intravenous Given 06/12/20  7782)  potassium chloride 10 mEq in 100 mL IVPB (10 mEq Intravenous New Bag/Given 06/12/20 0958)  sodium chloride 0.9 % bolus 1,000 mL (0 mLs Intravenous Stopped 06/11/20 2241)  LORazepam (ATIVAN) injection 2 mg (2 mg Intravenous Given 06/11/20 2139)  thiamine (B-1) injection 100 mg (100 mg Intravenous Given 06/11/20 2139)  lactated ringers bolus 1,000 mL  (0 mLs Intravenous Stopped 06/11/20 2300)  LORazepam (ATIVAN) injection 2 mg (2 mg Intravenous Given 06/12/20 0224)  iohexol (OMNIPAQUE) 300 MG/ML solution 100 mL (100 mLs Intravenous Contrast Given 06/11/20 2314)  ceFEPIme (MAXIPIME) 2 g in sodium chloride 0.9 % 100 mL IVPB (0 g Intravenous Stopped 06/12/20 0115)  metroNIDAZOLE (FLAGYL) IVPB 500 mg (0 mg Intravenous Stopped 06/12/20 0232)  lactated ringers bolus 1,000 mL (0 mLs Intravenous Stopped 06/12/20 0700)  vancomycin (VANCOCIN) IVPB 1000 mg/200 mL premix (0 mg Intravenous Stopped 06/12/20 0313)    Followed by  vancomycin (VANCOREADY) IVPB 1250 mg/250 mL ( Intravenous Stopped 06/12/20 0631)  propofol (DIPRIVAN) 10 mg/mL bolus/IV push (90 mg Intravenous Given 06/12/20 0142)  propofol (DIPRIVAN) 10 mg/mL bolus/IV push (45 mg Intravenous Given 06/12/20 0145)  propofol (DIPRIVAN) 10 mg/mL bolus/IV push (65 mg Intravenous Given 06/12/20 0149)  ketorolac (TORADOL) 15 MG/ML injection 15 mg (15 mg Intravenous Given 06/12/20 0501)  ketorolac (TORADOL) 15 MG/ML injection (  Duplicate 06/12/20 0841)     ED Discharge Orders    None       Note:  This document was prepared using Dragon voice recognition software and may include unintentional dictation errors.   Concha Se, MD 06/12/20 [phone]

## 2020-06-11 NOTE — Progress Notes (Signed)
PHARMACY -  BRIEF ANTIBIOTIC NOTE   Pharmacy has received consult(s) for Cefepime and Vanc from an ED provider.  The patient's profile has been reviewed for ht/wt/allergies/indication/available labs.    One time order(s) placed for Cefepime 2 gm and Vancomycin 2250 mg.  Further antibiotics/pharmacy consults should be ordered by admitting physician if indicated.       Otelia Sergeant, PharmD, [facility] 06/12/2020 12:03 AM

## 2020-06-11 NOTE — ED Triage Notes (Signed)
Reported via EMS for seizure. Father reported tonic-clonic seizure like activity lasting about 1 minute. Patient states he drinks heavily and has been without alcohol 2-3 days. Has been sleeping all day. States he has left shoulder pain. Postictal upon arrival and confused.

## 2020-06-11 NOTE — ED Provider Notes (Incomplete)
[facility] Provider Note  ____________________________________________   Event Date/Time   First MD Initiated Contact with Patient 06/11/20 2111     (approximate)  I have reviewed the triage vital signs and the nursing notes.   HISTORY  Chief Complaint Seizures    HPI [facility] is a 30 y.o. Marcus with hypertension, anxiety who comes in for seizure.  Patient is a heavy drinker.  Stopped again 2 days ago.  Patient was laying around the house and had some left shoulder pain.  Then family noted a few minute witnessed tonic-clonic seizure.  No history of seizures previously.  Patient was post ictal upon EMS arrival.  No medications were given.  Patient's complaint of left shoulder pain constant, worse with movement, better at rest as well as upper abdominal pain that is severe in nature.  States that he stopped drinking alcohol secondary to the abdominal pain          Past Medical History:  Diagnosis Date  . Anxiety   . Hypertension     There are no problems to display for this patient.   No past surgical history on file.  Prior to Admission medications   Medication Sig Start Date End Date Taking? Authorizing Provider  albuterol (VENTOLIN HFA) 108 (90 Base) MCG/ACT inhaler Inhale 2 puffs into the lungs every 6 (six) hours as needed for wheezing or shortness of breath. 12/02/18   Nita Sickle, MD  Aspirin-Acetaminophen-Caffeine (GOODY HEADACHE PO) Take 1 packet by mouth as needed.    [provider]  ibuprofen (ADVIL,MOTRIN) 800 MG tablet Take 800 mg by mouth every 8 (eight) hours as needed for headache.    [provider]  ketorolac (TORADOL) 10 MG tablet Take 1 tablet (10 mg total) by mouth every 8 (eight) hours as needed. 08/10/15   Darci Current, MD  meloxicam (MOBIC) 15 MG tablet Take 1 tablet (15 mg total) by mouth daily. 09/05/19   Cuthriell, Delorise Royals, PA-C  ondansetron (ZOFRAN ODT) 4 MG  disintegrating tablet Take 1 tablet (4 mg total) by mouth every 8 (eight) hours as needed. 12/02/18   Nita Sickle, MD    Allergies Peanut-containing drug products and Shellfish allergy  No family history on file.  Social History Social History   Tobacco Use  . Smoking status: Current Every Day Smoker    Packs/day: 0.50    Types: Cigarettes    Last attempt to quit: 04/17/2015    Years since quitting: 5.1  . Smokeless tobacco: Never Used  Substance Use Topics  . Alcohol use: Yes    Alcohol/week: 18.0 standard drinks    Types: 18 Cans of beer per week    Comment: daily - beer plus 2 pints of liquor a day      Review of Systems Constitutional: No fever/chills Eyes: No visual changes. ENT: No sore throat. Cardiovascular: Denies chest pain. Respiratory: Denies shortness of breath. Gastrointestinal: Positive abdominal pain no nausea, no vomiting.  No diarrhea.  No constipation. Genitourinary: Negative for dysuria. Musculoskeletal: Negative for back pain.  Positive left shoulder pain Skin: Negative for rash. Neurological: Negative for headaches, focal weakness or numbness.  Positive seizure-like activity All other ROS negative ____________________________________________   PHYSICAL EXAM:  VITAL SIGNS: Blood pressure (!) 165/115, pulse (!) 120, temperature 97.8 F (36.6 C), temperature source Oral, resp. rate (!) 30, height 5\' 5"  (1.651 m), weight 90 kg, SpO2 100 %.  Constitutional: Alert and oriented.  Somewhat confused Eyes:  Conjunctivae are normal. EOMI. Head: Atraumatic. Nose: No congestion/rhinnorhea. Mouth/Throat: Mucous membranes are moist.   Neck: No stridor. Trachea Midline. FROM Cardiovascular: tachy regular rhythm. Grossly normal heart sounds.  Good peripheral circulation. Respiratory: Normal respiratory effort.  No retractions. Lungs CTAB. Gastrointestinal: Tenderness throughout the abdomen.  No distention. No abdominal bruits.  Musculoskeletal: Left  shoulder pain with 2+ distal pulse.  No joint effusions. Neurologic:  Normal speech and language. No gross focal neurologic deficits are appreciated.  Equal strength in arms and legs. Skin:  Skin is warm, dry and intact. No rash noted. Psychiatric: Mood and affect are normal. Speech and behavior are normal. GU: Deferred   ____________________________________________   LABS (all labs ordered are listed, but only abnormal results are displayed)  Labs Reviewed  CBC WITH DIFFERENTIAL/PLATELET - Abnormal; Notable for the following components:      Result Value   WBC 23.5 (*)    Neutro Abs 21.7 (*)    Lymphs Abs 0.6 (*)    Abs Immature Granulocytes 0.10 (*)    All other components within normal limits  COMPREHENSIVE METABOLIC PANEL - Abnormal; Notable for the following components:   Potassium 2.8 (*)    CO2 19 (*)    Glucose, Bld 148 (*)    Creatinine, Ser 1.55 (*)    AST 71 (*)    Total Bilirubin 1.3 (*)    Anion gap 17 (*)    All other components within normal limits  LIPASE, BLOOD - Abnormal; Notable for the following components:   Lipase 629 (*)    All other components within normal limits  TROPONIN I (HIGH SENSITIVITY) - Abnormal; Notable for the following components:   Troponin I (High Sensitivity) 93 (*)    All other components within normal limits  RESP PANEL BY RT-PCR (FLU A&B, COVID) ARPGX2  CULTURE, BLOOD (ROUTINE X 2)  CULTURE, BLOOD (ROUTINE X 2)  MAGNESIUM  ETHANOL  URINALYSIS, COMPLETE (UACMP) WITH MICROSCOPIC  URINE DRUG SCREEN, QUALITATIVE ([facility] ONLY)  TROPONIN I (HIGH SENSITIVITY)   ____________________________________________   ED ECG REPORT I, Concha SeMary E Funke, the attending physician, personally viewed and interpreted this ECG.  EKG sinus tachycardia rate of 125, no ST elevation, no T wave inversions except for lead III, normal intervals ____________________________________________  RADIOLOGY   Official radiology report(s): CT Head Wo  Contrast  Result Date: 06/11/2020 CLINICAL DATA:  Seizure post alcohol withdrawal EXAM: CT CERVICAL SPINE WITHOUT CONTRAST TECHNIQUE: Multidetector CT imaging of the cervical spine was performed without intravenous contrast. Multiplanar CT image reconstructions were also generated. COMPARISON:  None. FINDINGS: Brain: No evidence of acute territorial infarction, hemorrhage, hydrocephalus,extra-axial collection or mass lesion/mass effect. Normal gray-white differentiation. Ventricles are normal in size and contour. Vascular: No hyperdense vessel or unexpected calcification. Skull: The skull is intact. No fracture or focal lesion identified. Sinuses/Orbits: The visualized paranasal sinuses and mastoid air cells are clear. The orbits and globes intact. Other: None Cervical spine: Alignment: Physiologic Skull base and vertebrae: Visualized skull base is intact. No atlanto-occipital dissociation. The vertebral body heights are well maintained. No fracture or pathologic osseous lesion seen. Soft tissues and spinal canal: The visualized paraspinal soft tissues are unremarkable. No prevertebral soft tissue swelling is seen. The spinal canal is grossly unremarkable, no large epidural collection or significant canal narrowing. Disc levels:  No significant canal or neural foraminal narrowing. Upper chest: The lung apices are clear. Thoracic inlet is within normal limits. Other: None IMPRESSION: No acute intracranial abnormality. No acute fracture or malalignment  of the spine. Electronically Signed   By: Jonna Clark M.D.   On: 06/11/2020 23:41   CT Cervical Spine Wo Contrast  Result Date: 06/11/2020 CLINICAL DATA:  Seizure post alcohol withdrawal EXAM: CT CERVICAL SPINE WITHOUT CONTRAST TECHNIQUE: Multidetector CT imaging of the cervical spine was performed without intravenous contrast. Multiplanar CT image reconstructions were also generated. COMPARISON:  None. FINDINGS: Brain: No evidence of acute territorial  infarction, hemorrhage, hydrocephalus,extra-axial collection or mass lesion/mass effect. Normal gray-white differentiation. Ventricles are normal in size and contour. Vascular: No hyperdense vessel or unexpected calcification. Skull: The skull is intact. No fracture or focal lesion identified. Sinuses/Orbits: The visualized paranasal sinuses and mastoid air cells are clear. The orbits and globes intact. Other: None Cervical spine: Alignment: Physiologic Skull base and vertebrae: Visualized skull base is intact. No atlanto-occipital dissociation. The vertebral body heights are well maintained. No fracture or pathologic osseous lesion seen. Soft tissues and spinal canal: The visualized paraspinal soft tissues are unremarkable. No prevertebral soft tissue swelling is seen. The spinal canal is grossly unremarkable, no large epidural collection or significant canal narrowing. Disc levels:  No significant canal or neural foraminal narrowing. Upper chest: The lung apices are clear. Thoracic inlet is within normal limits. Other: None IMPRESSION: No acute intracranial abnormality. No acute fracture or malalignment of the spine. Electronically Signed   By: Jonna Clark M.D.   On: 06/11/2020 23:41   CT ABDOMEN PELVIS W CONTRAST  Result Date: 06/11/2020 CLINICAL DATA:  Abdominal distension. EXAM: CT ABDOMEN AND PELVIS WITH CONTRAST TECHNIQUE: Multidetector CT imaging of the abdomen and pelvis was performed using the standard protocol following bolus administration of intravenous contrast. CONTRAST:  OMNIPAQUE IOHEXOL 300 MG/ML  SOLN COMPARISON:  None. FINDINGS: Lower chest: There are trace bilateral pleural effusions.The heart size is mildly enlarged. Hepatobiliary: The liver is normal. Normal gallbladder.There is no biliary ductal dilation. Pancreas: There is peripancreatic fat stranding. The pancreas appears to be uniformly enhancing. Spleen: Unremarkable. Adrenals/Urinary Tract: --Adrenal glands: Unremarkable.  --Right kidney/ureter: No hydronephrosis or radiopaque kidney stones. --Left kidney/ureter: No hydronephrosis or radiopaque kidney stones. --Urinary bladder: The urinary bladder is distended. Stomach/Bowel: --Stomach/Duodenum: No hiatal hernia or other gastric abnormality. Normal duodenal course and caliber. --Small bowel: Unremarkable. --Colon: Unremarkable. --Appendix: Normal. Vascular/Lymphatic: Normal course and caliber of the major abdominal vessels. --No retroperitoneal lymphadenopathy. --No mesenteric lymphadenopathy. --No pelvic or inguinal lymphadenopathy. Reproductive: Unremarkable Other: There is retroperitoneal free fluid. There is a small volume intraperitoneal free fluid. Musculoskeletal. No acute displaced fractures. IMPRESSION: 1. Findings consistent with acute interstitial pancreatitis. Correlation with lipase is recommended. 2. Small volume intraperitoneal and retroperitoneal free fluid. 3. Trace bilateral pleural effusions. 4. Distended urinary bladder. Electronically Signed   By: Katherine Mantle M.D.   On: 06/11/2020 23:41    ____________________________________________   PROCEDURES  Procedure(s) performed (including Critical Care):  .1-3 Lead EKG Interpretation Performed by: Concha Se, MD Authorized by: Concha Se, MD     Interpretation: abnormal     ECG rate:  120s    ECG rate assessment: tachycardic     Rhythm: sinus rhythm     Ectopy: none     Conduction: normal   .Critical Care Performed by: Concha Se, MD Authorized by: Concha Se, MD   Critical care provider statement:    Critical care time (minutes):  45   Critical care was necessary to treat or prevent imminent or life-threatening deterioration of the following conditions:  Sepsis and CNS failure or compromise  Critical care was time spent personally by me on the following activities:  Discussions with consultants, evaluation of patient's response to treatment, examination of patient, ordering  and performing treatments and interventions, ordering and review of laboratory studies, ordering and review of radiographic studies, pulse oximetry, re-evaluation of patient's condition, obtaining history from patient or surrogate and review of old charts     ____________________________________________   INITIAL IMPRESSION / ASSESSMENT AND PLAN / ED COURSE  [facility] was evaluated in Emergency Department on 06/11/2020 for the symptoms described in the history of present illness. He was evaluated in the context of the global COVID-19 pandemic, which necessitated consideration that the patient might be at risk for infection with the SARS-CoV-2 virus that causes COVID-19. Institutional protocols and algorithms that pertain to the evaluation of patients at risk for COVID-19 are in a state of rapid change based on information released by regulatory bodies including the CDC and federal and state organizations. These policies and algorithms were followed during the patient's care in the ED.     Patient is a 30 year old Marcus who comes in with significant tachycardia and seizure in the setting of alcohol withdrawal.  Will get CT head to evaluate for intracranial hemorrhage, mass.  CT cervical evaluate for cervical fracture.  Patient is some abdominal tenderness will get CT abdomen to evaluate for acute abdominal process.  Will get labs evaluate for Electra abnormalities, AKI.  Will start patient on 2 L of fluid as well as give 2 mg of Ativan and closely monitor his alcohol withdrawal.  X-rays to evaluate his shoulder        ____________________________________________   FINAL CLINICAL IMPRESSION(S) / ED DIAGNOSES   Final diagnoses:  None      MEDICATIONS GIVEN DURING THIS VISIT:  Medications  sodium chloride 0.9 % bolus 1,000 mL (has no administration in time range)  LORazepam (ATIVAN) injection 2 mg (has no administration in time range)  thiamine (B-1) injection 100 mg (has no  administration in time range)     ED Discharge Orders    None       Note:  This document was prepared using Dragon voice recognition software and may include unintentional dictation errors.

## 2020-06-11 NOTE — Progress Notes (Signed)
CODE SEPSIS - PHARMACY COMMUNICATION  **Broad Spectrum Antibiotics should be administered within 1 hour of Sepsis diagnosis**  Time Code Sepsis Called/Page Received: 1153  Antibiotics Ordered: Cefepime and Vancomycin  Time of 1st antibiotic administration: 0045   Otelia Sergeant, PharmD, [facility] 06/11/2020 11:58 PM

## 2020-06-11 NOTE — ED Notes (Signed)
Pt off unit at this time

## 2020-06-11 NOTE — ED Notes (Signed)
Pt's sister, Sarita Bottom, called for update on pt. Pt out of room at this time. [phone]

## 2020-06-12 ENCOUNTER — Observation Stay: Payer: Self-pay

## 2020-06-12 ENCOUNTER — Encounter: Payer: Self-pay | Admitting: Internal Medicine

## 2020-06-12 ENCOUNTER — Inpatient Hospital Stay (HOSPITAL_COMMUNITY)
Admit: 2020-06-12 | Discharge: 2020-06-12 | Disposition: A | Discharge status: Home / Self Care | Payer: Self-pay | Attending: Family Medicine | Admitting: Family Medicine

## 2020-06-12 ENCOUNTER — Inpatient Hospital Stay: Payer: Self-pay

## 2020-06-12 DIAGNOSIS — F10230 Alcohol dependence with withdrawal, uncomplicated: Secondary | ICD-10-CM

## 2020-06-12 DIAGNOSIS — E872 Acidosis: Secondary | ICD-10-CM

## 2020-06-12 DIAGNOSIS — F1090 Alcohol use, unspecified, uncomplicated: Secondary | ICD-10-CM

## 2020-06-12 DIAGNOSIS — E8729 Other acidosis: Secondary | ICD-10-CM

## 2020-06-12 DIAGNOSIS — F10939 Alcohol use, unspecified with withdrawal, unspecified: Secondary | ICD-10-CM

## 2020-06-12 DIAGNOSIS — S43005A Unspecified dislocation of left shoulder joint, initial encounter: Secondary | ICD-10-CM

## 2020-06-12 DIAGNOSIS — F10239 Alcohol dependence with withdrawal, unspecified: Secondary | ICD-10-CM

## 2020-06-12 DIAGNOSIS — I10 Essential (primary) hypertension: Secondary | ICD-10-CM

## 2020-06-12 DIAGNOSIS — D72829 Elevated white blood cell count, unspecified: Secondary | ICD-10-CM

## 2020-06-12 DIAGNOSIS — R000 Tachycardia, unspecified: Secondary | ICD-10-CM

## 2020-06-12 DIAGNOSIS — Z7289 Other problems related to lifestyle: Secondary | ICD-10-CM

## 2020-06-12 DIAGNOSIS — K852 Alcohol induced acute pancreatitis without necrosis or infection: Secondary | ICD-10-CM

## 2020-06-12 DIAGNOSIS — N179 Acute kidney failure, unspecified: Secondary | ICD-10-CM

## 2020-06-12 DIAGNOSIS — E876 Hypokalemia: Secondary | ICD-10-CM

## 2020-06-12 DIAGNOSIS — R778 Other specified abnormalities of plasma proteins: Secondary | ICD-10-CM

## 2020-06-12 DIAGNOSIS — R079 Chest pain, unspecified: Secondary | ICD-10-CM

## 2020-06-12 DIAGNOSIS — Z789 Other specified health status: Secondary | ICD-10-CM

## 2020-06-12 DIAGNOSIS — R569 Unspecified convulsions: Secondary | ICD-10-CM

## 2020-06-12 LAB — TROPONIN I (HIGH SENSITIVITY): Troponin I (High Sensitivity): 214 ng/L (ref <18)

## 2020-06-12 LAB — CBC
HCT: 41.9 % (ref 39.0–52.0)
Hemoglobin: 14.4 g/dL (ref 13.0–17.0)
MCH: 30.9 pg (ref 26.0–34.0)
MCHC: 34.4 g/dL (ref 30.0–36.0)
MCV: 89.9 fL (ref 80.0–100.0)
Platelets: 277 10*3/uL (ref 150–400)
RBC: 4.66 MIL/uL (ref 4.22–5.81)
RDW: 12.1 % (ref 11.5–15.5)
WBC: 20.2 10*3/uL — ABNORMAL HIGH (ref 4.0–10.5)
nRBC: 0 % (ref 0.0–0.2)

## 2020-06-12 LAB — URINE DRUG SCREEN, QUALITATIVE (ARMC ONLY)
Amphetamines, Ur Screen: NOT DETECTED
Barbiturates, Ur Screen: NOT DETECTED
Benzodiazepine, Ur Scrn: NOT DETECTED
Cannabinoid 50 Ng, Ur ~~LOC~~: NOT DETECTED
Cocaine Metabolite,Ur ~~LOC~~: NOT DETECTED
MDMA (Ecstasy)Ur Screen: NOT DETECTED
Methadone Scn, Ur: NOT DETECTED
Opiate, Ur Screen: NOT DETECTED
Phencyclidine (PCP) Ur S: NOT DETECTED
Tricyclic, Ur Screen: NOT DETECTED

## 2020-06-12 LAB — URINALYSIS, COMPLETE (UACMP) WITH MICROSCOPIC
Bilirubin Urine: NEGATIVE
Glucose, UA: 50 mg/dL — AB
Ketones, ur: 20 mg/dL — AB
Leukocytes,Ua: NEGATIVE
Nitrite: NEGATIVE
Protein, ur: >=300 mg/dL — AB
Specific Gravity, Urine: 1.027 (ref 1.005–1.030)
Squamous Epithelial / HPF: NONE SEEN (ref 0–5)
pH: 6 (ref 5.0–8.0)

## 2020-06-12 LAB — COMPREHENSIVE METABOLIC PANEL
ALT: 18 U/L (ref 0–44)
AST: 61 U/L — ABNORMAL HIGH (ref 15–41)
Albumin: 3.4 g/dL — ABNORMAL LOW (ref 3.5–5.0)
Alkaline Phosphatase: 72 U/L (ref 38–126)
Anion gap: 9 (ref 5–15)
BUN: 9 mg/dL (ref 6–20)
CO2: 19 mmol/L — ABNORMAL LOW (ref 22–32)
Calcium: 8.2 mg/dL — ABNORMAL LOW (ref 8.9–10.3)
Chloride: 109 mmol/L (ref 98–111)
Creatinine, Ser: 1.12 mg/dL (ref 0.61–1.24)
GFR, Estimated: >60 mL/min (ref >60)
Glucose, Bld: 115 mg/dL — ABNORMAL HIGH (ref 70–99)
Potassium: 3.3 mmol/L — ABNORMAL LOW (ref 3.5–5.1)
Sodium: 137 mmol/L (ref 135–145)
Total Bilirubin: 2.2 mg/dL — ABNORMAL HIGH (ref 0.3–1.2)
Total Protein: 6.6 g/dL (ref 6.5–8.1)

## 2020-06-12 LAB — PHOSPHORUS: Phosphorus: 3.2 mg/dL (ref 2.5–4.6)

## 2020-06-12 LAB — LACTIC ACID, PLASMA
Lactic Acid, Venous: 1.1 mmol/L (ref 0.5–1.9)
Lactic Acid, Venous: 1.7 mmol/L (ref 0.5–1.9)

## 2020-06-12 LAB — HIV ANTIBODY (ROUTINE TESTING W REFLEX): HIV Screen 4th Generation wRfx: NONREACTIVE

## 2020-06-12 LAB — MRSA PCR SCREENING: MRSA by PCR: NEGATIVE

## 2020-06-12 LAB — GLUCOSE, CAPILLARY
Glucose-Capillary: 115 mg/dL — ABNORMAL HIGH (ref 70–99)
Glucose-Capillary: 78 mg/dL (ref 70–99)

## 2020-06-12 MED ORDER — PROPOFOL 10 MG/ML IV BOLUS
INTRAVENOUS | Status: AC | PRN
  Administered 2020-06-12: 90 mg via INTRAVENOUS

## 2020-06-12 MED ORDER — VANCOMYCIN HCL 1250 MG/250ML IV SOLN
1250.0000 mg | Freq: Once | INTRAVENOUS | Status: AC
  Administered 2020-06-12: 1250 mg via INTRAVENOUS
  Filled 2020-06-12: qty 250

## 2020-06-12 MED ORDER — PROPOFOL 10 MG/ML IV BOLUS: 1.0000 mg/kg | Freq: Once | INTRAVENOUS | Status: DC

## 2020-06-12 MED ORDER — METOPROLOL TARTRATE 5 MG/5ML IV SOLN: 5.0000 mg | Freq: Once | INTRAVENOUS | Status: AC

## 2020-06-12 MED ORDER — LABETALOL HCL 5 MG/ML IV SOLN
5.0000 mg | INTRAVENOUS | Status: DC | PRN
  Administered 2020-06-12 (×2): 5 mg via INTRAVENOUS
  Filled 2020-06-12 (×2): qty 4

## 2020-06-12 MED ORDER — PROPOFOL 10 MG/ML IV BOLUS
INTRAVENOUS | Status: AC | PRN
  Administered 2020-06-12: 65 mg via INTRAVENOUS

## 2020-06-12 MED ORDER — HYDROMORPHONE HCL 1 MG/ML IJ SOLN
0.5000 mg | Freq: Four times a day (QID) | INTRAMUSCULAR | Status: DC | PRN
  Administered 2020-06-12: 0.5 mg via INTRAVENOUS
  Filled 2020-06-12: qty 1

## 2020-06-12 MED ORDER — VANCOMYCIN HCL IN DEXTROSE 1-5 GM/200ML-% IV SOLN
1000.0000 mg | Freq: Once | INTRAVENOUS | Status: AC
  Administered 2020-06-12: 1000 mg via INTRAVENOUS
  Filled 2020-06-12: qty 200

## 2020-06-12 MED ORDER — CHLORHEXIDINE GLUCONATE CLOTH 2 % EX PADS
6.0000 | MEDICATED_PAD | Freq: Every day | CUTANEOUS | Status: DC
  Administered 2020-06-12 – 2020-06-25 (×13): 6 via TOPICAL
  Filled 2020-06-12: qty 6

## 2020-06-12 MED ORDER — ENOXAPARIN SODIUM 60 MG/0.6ML ~~LOC~~ SOLN
0.5000 mg/kg | SUBCUTANEOUS | Status: DC
  Administered 2020-06-12 – 2020-06-18 (×6): 45 mg via SUBCUTANEOUS
  Filled 2020-06-12 (×7): qty 0.6

## 2020-06-12 MED ORDER — ONDANSETRON HCL 4 MG/2ML IJ SOLN
4.0000 mg | Freq: Four times a day (QID) | INTRAMUSCULAR | Status: DC | PRN
  Administered 2020-06-12 – 2020-06-23 (×2): 4 mg via INTRAVENOUS
  Filled 2020-06-12 (×4): qty 2

## 2020-06-12 MED ORDER — THIAMINE HCL 100 MG/ML IJ SOLN
100.0000 mg | Freq: Every day | INTRAMUSCULAR | Status: DC
  Administered 2020-06-12 – 2020-06-22 (×8): 100 mg via INTRAVENOUS
  Filled 2020-06-12 (×8): qty 2

## 2020-06-12 MED ORDER — POTASSIUM CHLORIDE 10 MEQ/100ML IV SOLN
10.0000 meq | INTRAVENOUS | Status: AC
  Administered 2020-06-12 (×3): 10 meq via INTRAVENOUS
  Filled 2020-06-12 (×3): qty 100

## 2020-06-12 MED ORDER — METOPROLOL TARTRATE 5 MG/5ML IV SOLN
INTRAVENOUS | Status: AC
  Administered 2020-06-12: 5 mg via INTRAVENOUS
  Filled 2020-06-12: qty 5

## 2020-06-12 MED ORDER — SODIUM CHLORIDE 0.9% FLUSH
3.0000 mL | Freq: Two times a day (BID) | INTRAVENOUS | Status: DC
  Administered 2020-06-12 – 2020-06-24 (×26): 3 mL via INTRAVENOUS

## 2020-06-12 MED ORDER — KETOROLAC TROMETHAMINE 15 MG/ML IJ SOLN
INTRAMUSCULAR | Status: AC
  Filled 2020-06-12: qty 1

## 2020-06-12 MED ORDER — ADULT MULTIVITAMIN W/MINERALS CH
1.0000 | ORAL_TABLET | Freq: Every day | ORAL | Status: DC
  Administered 2020-06-12 – 2020-06-26 (×9): 1 via ORAL
  Filled 2020-06-12 (×11): qty 1

## 2020-06-12 MED ORDER — LORAZEPAM 2 MG/ML IJ SOLN
1.0000 mg | INTRAMUSCULAR | Status: DC | PRN
Start: 2020-06-12 — End: 2020-06-14
  Administered 2020-06-13 – 2020-06-14 (×8): 2 mg via INTRAVENOUS
  Filled 2020-06-12 (×8): qty 1

## 2020-06-12 MED ORDER — THIAMINE HCL 100 MG PO TABS
100.0000 mg | ORAL_TABLET | Freq: Every day | ORAL | Status: DC
  Administered 2020-06-16 – 2020-06-26 (×6): 100 mg via ORAL
  Filled 2020-06-12 (×7): qty 1

## 2020-06-12 MED ORDER — KETOROLAC TROMETHAMINE 15 MG/ML IJ SOLN
15.0000 mg | Freq: Once | INTRAMUSCULAR | Status: AC
  Administered 2020-06-12: 15 mg via INTRAVENOUS

## 2020-06-12 MED ORDER — HYDROMORPHONE HCL 1 MG/ML IJ SOLN
1.0000 mg | INTRAMUSCULAR | Status: DC | PRN
  Administered 2020-06-13 (×2): 1 mg via INTRAVENOUS
  Filled 2020-06-12 (×2): qty 1

## 2020-06-12 MED ORDER — METOPROLOL TARTRATE 5 MG/5ML IV SOLN
5.0000 mg | Freq: Four times a day (QID) | INTRAVENOUS | Status: DC
  Administered 2020-06-12 – 2020-06-13 (×3): 5 mg via INTRAVENOUS
  Filled 2020-06-12 (×3): qty 5

## 2020-06-12 MED ORDER — ALBUTEROL SULFATE HFA 108 (90 BASE) MCG/ACT IN AERS
2.0000 | INHALATION_SPRAY | Freq: Four times a day (QID) | RESPIRATORY_TRACT | Status: DC | PRN
  Filled 2020-06-12: qty 6.7

## 2020-06-12 MED ORDER — LACTATED RINGERS IV SOLN: INTRAVENOUS | Status: DC

## 2020-06-12 MED ORDER — ACETAMINOPHEN 10 MG/ML IV SOLN
1000.0000 mg | Freq: Once | INTRAVENOUS | Status: AC
  Administered 2020-06-12: 1000 mg via INTRAVENOUS
  Filled 2020-06-12 (×2): qty 100

## 2020-06-12 MED ORDER — PROPOFOL 10 MG/ML IV BOLUS
INTRAVENOUS | Status: AC | PRN
Start: 2020-06-12 — End: 2020-06-12
  Administered 2020-06-12: 45 mg via INTRAVENOUS

## 2020-06-12 MED ORDER — LORAZEPAM 1 MG PO TABS: 1.0000 mg | ORAL_TABLET | ORAL | Status: DC | PRN

## 2020-06-12 MED ORDER — FOLIC ACID 1 MG PO TABS
1.0000 mg | ORAL_TABLET | Freq: Every day | ORAL | Status: DC
  Administered 2020-06-12: 1 mg via ORAL
  Filled 2020-06-12: qty 1

## 2020-06-12 MED ORDER — HYDROMORPHONE HCL 1 MG/ML IJ SOLN
0.5000 mg | INTRAMUSCULAR | Status: DC | PRN
  Administered 2020-06-12 (×3): 0.5 mg via INTRAVENOUS
  Filled 2020-06-12 (×3): qty 1

## 2020-06-12 MED ORDER — METOPROLOL TARTRATE 25 MG PO TABS: 25.0000 mg | ORAL_TABLET | Freq: Four times a day (QID) | ORAL | Status: DC

## 2020-06-12 MED ORDER — SODIUM CHLORIDE 0.9 % IV BOLUS
1000.0000 mL | Freq: Once | INTRAVENOUS | Status: AC
  Administered 2020-06-12: 1000 mL via INTRAVENOUS

## 2020-06-12 MED ORDER — KETAMINE HCL 10 MG/ML IJ SOLN: 0.5000 mg/kg | Freq: Once | INTRAMUSCULAR | Status: DC

## 2020-06-12 MED ORDER — POTASSIUM CHLORIDE 10 MEQ/100ML IV SOLN
10.0000 meq | INTRAVENOUS | Status: AC
  Administered 2020-06-12 (×3): 10 meq via INTRAVENOUS
  Filled 2020-06-12 (×5): qty 100

## 2020-06-12 MED ORDER — PROPOFOL 10 MG/ML IV BOLUS
0.5000 mg/kg | Freq: Once | INTRAVENOUS | Status: DC
  Filled 2020-06-12: qty 20

## 2020-06-12 MED ORDER — SODIUM CHLORIDE 0.9 % IV SOLN: INTRAVENOUS | Status: AC

## 2020-06-12 NOTE — Consult Note (Signed)
ORTHOPAEDIC CONSULTATION  REQUESTING PHYSICIAN: Cipriano Bunker, MD  Chief Complaint:   L shoulder pain  History of Present Illness: History obtained from chart review, discussion with medical providers, and the patient.  Marcus Small is a 30 y.o. male with a past medical history significant for alcohol abuse, depression with prior suicidal ideation, anxiety, and hypertension who stated that he stopped drinking alcohol approximately 2-3 days ago due to severe abdominal pain.  He had an approximately 1 minute tonic-clonic seizure and sustained a glenohumeral joint dislocation with greater tuberosity fracture on the left side.  In the ED he was found to have signs and symptoms of pancreatitis, acute kidney injury, And elevated troponins.  A closed reduction was performed in the emergency department under procedural sedation.  Follow-up CT scan showed an appropriate reduction with an impacted and mildly displaced large greater tuberosity fracture.  He was admitted to the ICU for further care from the emergency department.  This is his first known glenohumeral joint dislocation.  He does not play contact sports.  Past Medical History:  Diagnosis Date  . Anxiety   . Hypertension    History reviewed. No pertinent surgical history. Social History   Socioeconomic History  . Marital status: Single    Spouse name: Not on file  . Number of children: Not on file  . Years of education: Not on file  . Highest education level: Not on file  Occupational History  . Not on file  Tobacco Use  . Smoking status: Current Every Day Smoker    Packs/day: 0.50    Types: Cigarettes    Last attempt to quit: 04/17/2015    Years since quitting: 5.1  . Smokeless tobacco: Never Used  Substance and Sexual Activity  . Alcohol use: Yes    Alcohol/week: 18.0 standard drinks    Types: 18 Cans of beer per week    Comment: daily - beer plus 2 pints  of liquor a day  . Drug use: Not on file  . Sexual activity: Not on file  Other Topics Concern  . Not on file  Social History Narrative  . Not on file   Social Determinants of Health   Financial Resource Strain: Not on file  Food Insecurity: Not on file  Transportation Needs: Not on file  Physical Activity: Not on file  Stress: Not on file  Social Connections: Not on file   Family History  Problem Relation Age of Onset  . Hypertension Mother    Allergies  Allergen Reactions  . Peanut-Containing Drug Products Itching  . Shellfish Allergy    Prior to Admission medications   Medication Sig Start Date End Date Taking? Authorizing Provider  Aspirin-Acetaminophen-Caffeine (GOODY HEADACHE PO) Take 1 packet by mouth as needed.   Yes [provider]  ibuprofen (ADVIL) 200 MG tablet Take 200 mg by mouth every 6 (six) hours as needed for headache.   Yes [provider]  albuterol (VENTOLIN HFA) 108 (90 Base) MCG/ACT inhaler Inhale 2 puffs into the lungs every 6 (six) hours as needed for wheezing or shortness of breath. Patient not taking: No sig reported 12/02/18   Don Perking, [place], MD  ketorolac (TORADOL) 10 MG tablet Take 1 tablet (10 mg total) by mouth every 8 (eight) hours as needed. Patient not taking: No sig reported 08/10/15   Darci Current, MD  meloxicam (MOBIC) 15 MG tablet Take 1 tablet (15 mg total) by mouth daily. Patient not taking: No sig reported 09/05/19  Cuthriell, Delorise Royals, PA-C  ondansetron (ZOFRAN ODT) 4 MG disintegrating tablet Take 1 tablet (4 mg total) by mouth every 8 (eight) hours as needed. Patient not taking: No sig reported 12/02/18   Nita Sickle, MD   Recent Labs    06/11/20 2132 06/12/20 0545  WBC 23.5* 20.2*  HGB 15.0 14.4  HCT 45.6 41.9  PLT 359 277  K 2.8* 3.3*  CL 102 109  CO2 19* 19*  BUN 9 9  CREATININE 1.55* 1.12  GLUCOSE 148* 115*  CALCIUM 9.2 8.2*   CT Head Wo Contrast  Result Date: 06/11/2020 CLINICAL  DATA:  Seizure post alcohol withdrawal EXAM: CT CERVICAL SPINE WITHOUT CONTRAST TECHNIQUE: Multidetector CT imaging of the cervical spine was performed without intravenous contrast. Multiplanar CT image reconstructions were also generated. COMPARISON:  None. FINDINGS: Brain: No evidence of acute territorial infarction, hemorrhage, hydrocephalus,extra-axial collection or mass lesion/mass effect. Normal gray-white differentiation. Ventricles are normal in size and contour. Vascular: No hyperdense vessel or unexpected calcification. Skull: The skull is intact. No fracture or focal lesion identified. Sinuses/Orbits: The visualized paranasal sinuses and mastoid air cells are clear. The orbits and globes intact. Other: None Cervical spine: Alignment: Physiologic Skull base and vertebrae: Visualized skull base is intact. No atlanto-occipital dissociation. The vertebral body heights are well maintained. No fracture or pathologic osseous lesion seen. Soft tissues and spinal canal: The visualized paraspinal soft tissues are unremarkable. No prevertebral soft tissue swelling is seen. The spinal canal is grossly unremarkable, no large epidural collection or significant canal narrowing. Disc levels:  No significant canal or neural foraminal narrowing. Upper chest: The lung apices are clear. Thoracic inlet is within normal limits. Other: None IMPRESSION: No acute intracranial abnormality. No acute fracture or malalignment of the spine. Electronically Signed   By: Jonna Clark M.D.   On: 06/11/2020 23:41   CT Cervical Spine Wo Contrast  Result Date: 06/11/2020 CLINICAL DATA:  Seizure post alcohol withdrawal EXAM: CT CERVICAL SPINE WITHOUT CONTRAST TECHNIQUE: Multidetector CT imaging of the cervical spine was performed without intravenous contrast. Multiplanar CT image reconstructions were also generated. COMPARISON:  None. FINDINGS: Brain: No evidence of acute territorial infarction, hemorrhage, hydrocephalus,extra-axial  collection or mass lesion/mass effect. Normal gray-white differentiation. Ventricles are normal in size and contour. Vascular: No hyperdense vessel or unexpected calcification. Skull: The skull is intact. No fracture or focal lesion identified. Sinuses/Orbits: The visualized paranasal sinuses and mastoid air cells are clear. The orbits and globes intact. Other: None Cervical spine: Alignment: Physiologic Skull base and vertebrae: Visualized skull base is intact. No atlanto-occipital dissociation. The vertebral body heights are well maintained. No fracture or pathologic osseous lesion seen. Soft tissues and spinal canal: The visualized paraspinal soft tissues are unremarkable. No prevertebral soft tissue swelling is seen. The spinal canal is grossly unremarkable, no large epidural collection or significant canal narrowing. Disc levels:  No significant canal or neural foraminal narrowing. Upper chest: The lung apices are clear. Thoracic inlet is within normal limits. Other: None IMPRESSION: No acute intracranial abnormality. No acute fracture or malalignment of the spine. Electronically Signed   By: Jonna Clark M.D.   On: 06/11/2020 23:41   CT ABDOMEN PELVIS W CONTRAST  Result Date: 06/11/2020 CLINICAL DATA:  Abdominal distension. EXAM: CT ABDOMEN AND PELVIS WITH CONTRAST TECHNIQUE: Multidetector CT imaging of the abdomen and pelvis was performed using the standard protocol following bolus administration of intravenous contrast. CONTRAST:  OMNIPAQUE IOHEXOL 300 MG/ML  SOLN COMPARISON:  None. FINDINGS: Lower chest: There  are trace bilateral pleural effusions.The heart size is mildly enlarged. Hepatobiliary: The liver is normal. Normal gallbladder.There is no biliary ductal dilation. Pancreas: There is peripancreatic fat stranding. The pancreas appears to be uniformly enhancing. Spleen: Unremarkable. Adrenals/Urinary Tract: --Adrenal glands: Unremarkable. --Right kidney/ureter: No hydronephrosis or radiopaque  kidney stones. --Left kidney/ureter: No hydronephrosis or radiopaque kidney stones. --Urinary bladder: The urinary bladder is distended. Stomach/Bowel: --Stomach/Duodenum: No hiatal hernia or other gastric abnormality. Normal duodenal course and caliber. --Small bowel: Unremarkable. --Colon: Unremarkable. --Appendix: Normal. Vascular/Lymphatic: Normal course and caliber of the major abdominal vessels. --No retroperitoneal lymphadenopathy. --No mesenteric lymphadenopathy. --No pelvic or inguinal lymphadenopathy. Reproductive: Unremarkable Other: There is retroperitoneal free fluid. There is a small volume intraperitoneal free fluid. Musculoskeletal. No acute displaced fractures. IMPRESSION: 1. Findings consistent with acute interstitial pancreatitis. Correlation with lipase is recommended. 2. Small volume intraperitoneal and retroperitoneal free fluid. 3. Trace bilateral pleural effusions. 4. Distended urinary bladder. Electronically Signed   By: Katherine Mantlehristopher  Green M.D.   On: 06/11/2020 23:41   CT Shoulder Left Wo Contrast  Result Date: 06/12/2020 CLINICAL DATA:  Shoulder trauma EXAM: CT OF THE UPPER LEFT EXTREMITY WITHOUT CONTRAST TECHNIQUE: Multidetector CT imaging of the upper left extremity was performed according to the standard protocol. COMPARISON:  Radiograph day FINDINGS: Bones/Joint/Cartilage There is been interval reduction of the anteroinferior dislocation. The humeral head now articulates with the glenoid. Again noted is comminuted impacted Hill-Sachs deformity of the posterolateral corner involving the anterior bicipital groove. [facility] joint appears to be intact. Pain a small joint effusion is present. Ligaments Suboptimally assessed by CT. Muscles and Tendons Overlying deltoid musculature edema is seen. The remainder of the muscles appear to be intact. The rotator cuff appears to be grossly intact. Soft tissues Overlying subcutaneous edema seen. IMPRESSION: 1. Interval reduction of the anterior  dislocation with the glenohumeral joint in near anatomic location. 2. Comminuted slightly impacted Hill-Sachs deformity which involve the anterior bicipital groove. Electronically Signed   By: Jonna ClarkBindu  Avutu M.D.   On: 06/12/2020 03:26   DG Chest Port 1 View  Result Date: 06/12/2020 CLINICAL DATA:  Seizure EXAM: PORTABLE CHEST 1 VIEW COMPARISON:  12/01/2018 FINDINGS: Lungs volumes are small, but are symmetric and are clear. No pneumothorax or pleural effusion. Cardiac size within normal limits. Pulmonary vascularity is normal. Osseous structures are age-appropriate. No acute bone abnormality. IMPRESSION: No active disease. Electronically Signed   By: Helyn NumbersAshesh  Parikh MD   On: 06/12/2020 00:22   DG Shoulder Left  Result Date: 06/12/2020 CLINICAL DATA:  Pain EXAM: LEFT SHOULDER - 2+ VIEW COMPARISON:  None. FINDINGS: There is an anterior inferior glenohumeral fracture dislocation of the left humerus. There is a displaced fracture fragment measuring approximately 3.6 cm. IMPRESSION: Anterior inferior glenohumeral fracture dislocation of the left humerus. Electronically Signed   By: Katherine Mantlehristopher  Green M.D.   On: 06/12/2020 00:02     Positive ROS: All other systems have been reviewed and were otherwise negative with the exception of those mentioned in the HPI and as above.  Physical Exam: BP 128/77   Pulse (!) 132   Temp 98.9 F (37.2 C) (Oral)   Resp (!) 32   Ht 5\' 5"  (1.651 m)   Wt 90 kg   SpO2 94%   BMI 33.02 kg/m  General:  drowsy, no acute distress Psychiatric: Nonagitated Cardiovascular:  No pedal edema, regular rate and rhythm Respiratory:  No wheezing, non-labored breathing Skin:  No lesions in the area of chief complaint, no erythema Neurologic:  Sensation intact distally, CN grossly intact Lymphatic:  No axillary or cervical lymphadenopathy  Orthopedic Exam:  LUE: +ain/pin/u, axillary motor SILT r/u/m, mild numbness in axillary distribution +rad pulse Arm in  sling/swath   Imaging:  As above: Initial radiographs in the emergency department showed a left glenohumeral dislocation with greater tuberosity fracture.  Subsequent postreduction CT scan showed an appropriately reduced glenohumeral joint with large, impacted and mildly displaced greater tuberosity fracture in the region of a Hill-Sachs deformity  Assessment/Plan: 30 year old male with left glenohumeral joint dislocation with greater tuberosity fracture 1.  We briefly discussed both surgical and nonsurgical management options.  We agreed to proceed with nonsurgical management with the arm in a sling and nonweightbearing on the left upper extremity.  Given his multiple other comorbidities, acute surgical intervention may not significantly improve outcome.  We did discuss that with complete nonsurgical management, he may have some limited range of motion and weakness as his rotator cuff may be currently involved.  We also discussed the high risk of recurrent dislocation given the size of the Hill-Sachs lesion, especially if the bone did not heal.  2.  He can follow up with our department as an outpatient in approximately 2 weeks with repeat radiographs.  Please page with any questions.      Signa Kell   06/12/2020 3:49 PM

## 2020-06-12 NOTE — Progress Notes (Signed)
Neuro: A&Ox4 although difficult to rouse, moves all extremities, turns self in bed, no seizure like activity Resp: stable on room air CV: normothermic, hypertension/tachycardia not relieved by labetalol or pain medication GIGU: NPO, no void/BM/emesis Skin: intact Social: no contact with family

## 2020-06-12 NOTE — ED Notes (Signed)
ED Provider at bedside. 

## 2020-06-12 NOTE — Care Plan (Signed)
This 30 years old male with PMH significant for alcohol use, anxiety, hypertension presents with seizures at home.  Family reports that patient had a tonic-clonic seizure that lasted for about 1 minute.  Family also reported history of heavy alcohol use.  Patient quit drinking for past 2 to 3 days due to ongoing abdominal pain, in that time he had been sleeping most of the day,  He also been complaining about left shoulder pain lately.  He was found to be tachycardic, tachypneic and hypertensive in the ED.  Labs include potassium 2.8, bicarb 19, BUN 9, creatinine 1.55, Lipase elevated at 629, initial troponin 93 trending up to 236.  Urine drug screen negative.  CT head without acute abnormality.  CT abdomen showed pancreatitis, small volume free fluid and distended bladder.  X-ray left shoulder showed dislocation.  Patient was given a dose of broad-spectrum antibiotics, IV fluids and Ativan in the ED. Patient is admitted for alcohol withdrawal seizures, alcohol level negative.  Patient will be continued with CIWA protocol with Ativan, He is also found to have pancreatitis,  will be kept NPO with IV fluids, there is no infectious etiology noted to explain leukocytosis. He is continued on IV fluids for acute kidney injury.  Cardiology was consulted for slightly elevated troponin which could be in the setting of seizures,  suspect demand ischemia, obtain 2D echo.  Psych consulted for history of significant depression and recent thoughts of suicide at home. Patient was seen and examined in the ED.  Patient been lethargic because of Ativan.

## 2020-06-12 NOTE — Sedation Documentation (Signed)
Patient's LUE placed in shoulder immobilizer with assist from Dr. York Cerise and Erie Noe RN.

## 2020-06-12 NOTE — ED Provider Notes (Signed)
----------------------------------------- 2:03 AM on 06/12/2020 -----------------------------------------  Dr. Fuller Plan admitted the patient to the hospitalist service and then also discovered the he has a left shoulder fracture-dislocation.  She spoke with Dr. Signa Kell who recommended that we go ahead and try to reduce it with procedural sedation in the ED and then obtain a noncontrast left shoulder CT to help determine whether or not there would be a need for surgery.  .Sedation  Date/Time: 06/12/2020 1:40 AM Performed by: Loleta Rose, MD Authorized by: Loleta Rose, MD   Consent:    Consent obtained:  Written, verbal and emergent situation (electronic informed consent)   Consent given by:  Patient   Risks discussed:  Allergic reaction, dysrhythmia, inadequate sedation, nausea, vomiting, respiratory compromise necessitating ventilatory assistance and intubation, prolonged sedation necessitating reversal and prolonged hypoxia resulting in organ damage Universal protocol:    Procedure explained and questions answered to patient or proxy's satisfaction: yes     Relevant documents present and verified: yes     Test results available: yes     Imaging studies available: yes     Required blood products, implants, devices, and special equipment available: yes     Immediately prior to procedure, a time out was called: yes     Patient identity confirmed:  Arm band and verbally with patient Indications:    Procedure performed:  Dislocation reduction   Procedure necessitating sedation performed by:  Physician performing sedation Pre-sedation assessment:    Time since last food or drink:  About 2 hrs since water, "days" since food   NPO status caution: urgency dictates proceeding with non-ideal NPO status     ASA classification: class 3 - patient with severe systemic disease     Mallampati score:  II - soft palate, uvula, fauces visible   Neck mobility: normal     Pre-sedation assessments  completed and reviewed: airway patency, cardiovascular function, hydration status, mental status, nausea/vomiting, pain level, respiratory function and temperature     Pre-sedation assessment completed:  06/12/2020 1:40 AM Immediate pre-procedure details:    Reassessment: Patient reassessed immediately prior to procedure     Reviewed: vital signs, relevant labs/tests and NPO status     Verified: bag valve mask available, emergency equipment available, intubation equipment available, IV patency confirmed, oxygen available, reversal medications available and suction available   Procedure details (see MAR for exact dosages):    Preoxygenation:  Nasal cannula   Sedation:  Propofol   Intended level of sedation: deep   Intra-procedure monitoring:  Blood pressure monitoring, continuous pulse oximetry, cardiac monitor, frequent vital sign checks, frequent LOC assessments and continuous capnometry   Intra-procedure events: none     Total Provider sedation time (minutes):  20 Post-procedure details:    Post-sedation assessment completed:  06/12/2020 2:06 AM   Attendance: Constant attendance by certified staff until patient recovered     Recovery: Patient returned to pre-procedure baseline     Post-sedation assessments completed and reviewed: airway patency, cardiovascular function, hydration status, mental status, pain level and respiratory function     Patient is stable for discharge or admission: yes     Procedure completion:  Tolerated well, no immediate complications Reduction of dislocation  Date/Time: 06/12/2020 1:40 AM Performed by: Loleta Rose, MD Authorized by: Loleta Rose, MD  Consent: The procedure was performed in an emergent situation. Verbal consent obtained. Written consent obtained. Risks and benefits: risks, benefits and alternatives were discussed Consent given by: patient Patient understanding: patient states understanding of the procedure  being performed Patient consent: the  patient's understanding of the procedure matches consent given Procedure consent: procedure consent matches procedure scheduled Relevant documents: relevant documents present and verified Imaging studies: imaging studies available Required items: required blood products, implants, devices, and special equipment available Patient identity confirmed: verbally with patient and arm band Time out: Immediately prior to procedure a "time out" was called to verify the correct patient, procedure, equipment, support staff and site/side marked as required. Local anesthesia used: no  Anesthesia: Local anesthesia used: no  Sedation: Patient sedated: yes Sedatives: propofol Sedation start date/time: 06/12/2020 1:43 AM Sedation end date/time: 06/12/2020 2:08 AM Vitals: Vital signs were monitored during sedation.  Patient tolerance: patient tolerated the procedure well with no immediate complications    The patient is not an ideal candidate for procedural sedation given his alcohol withdrawal symptoms, acute pancreatitis, fracture of the affected shoulder, etc.  However the emergent nature of the situation requires an attempt at reduction in spite of less than ideal circumstances.  When I evaluated the patient before the procedure, he was awake and alert and able to ask and understand my questions and provide consent, although his consent is questionable given that he has received Ativan and is seriously ill.  However the procedure is emergent and requires that we proceed.  He required a large amount of propofol (a full 200 mg IV) to get him to relax sufficiently to perform the procedure. Initial attempt at [facility] procedure did not work, but with [employer] procedure I felt and heard a very obvious "clunk" indicating that the reduction was successful.  However given the patient's body habitus/musculature, as well as the unstable nature of the fracture-dislocation, I am not completely certain that the reduction  stayed in place when we placed him in a shoulder immobilizer.  However given his medical instability I am not comfortable repeating the procedure even if the shoulder remains out.  I informed Dr. Signa Kell by secure chat text that I thought we are successful and we would proceed with a CT scan but he may need additional orthopedics treatment for the fracture-dislocation.  He acknowledged receipt of my message.  Once the patient is appropriately stable for CT scan he will go for the shoulder CT and then be transferred to his admission bed.   (Note that documentation was delayed due to multiple ED patients requiring immediate care.)  CT demonstrates adequate reduction of dislocation.     "CT Shoulder Left Wo Contrast  Result Date: 06/12/2020 CLINICAL DATA:  Shoulder trauma EXAM: CT OF THE UPPER LEFT EXTREMITY WITHOUT CONTRAST TECHNIQUE: Multidetector CT imaging of the upper left extremity was performed according to the standard protocol. COMPARISON:  Radiograph day FINDINGS: Bones/Joint/Cartilage There is been interval reduction of the anteroinferior dislocation. The humeral head now articulates with the glenoid. Again noted is comminuted impacted Hill-Sachs deformity of the posterolateral corner involving the anterior bicipital groove. [facility] joint appears to be intact. Pain a small joint effusion is present. Ligaments Suboptimally assessed by CT. Muscles and Tendons Overlying deltoid musculature edema is seen. The remainder of the muscles appear to be intact. The rotator cuff appears to be grossly intact. Soft tissues Overlying subcutaneous edema seen. IMPRESSION: 1. Interval reduction of the anterior dislocation with the glenohumeral joint in near anatomic location. 2. Comminuted slightly impacted Hill-Sachs deformity which involve the anterior bicipital groove. Electronically Signed   By: Jonna Clark M.D.   On: 06/12/2020 03:26 "     Loleta Rose, MD 06/12/20 [phone]

## 2020-06-12 NOTE — Progress Notes (Signed)
PHARMACIST - PHYSICIAN COMMUNICATION  CONCERNING:  Enoxaparin (Lovenox) for DVT Prophylaxis    RECOMMENDATION: Patient was prescribed enoxaprin 40mg  q24 hours for VTE prophylaxis.   Filed Weights   06/11/20 2124  Weight: 90 kg (198 lb 6.6 oz)    Body mass index is 33.02 kg/m.  Estimated Creatinine Clearance: 73.2 mL/min (A) (by C-G formula based on SCr of 1.55 mg/dL (H)).   Based on [facility] policy patient is candidate for enoxaparin 0.5mg /kg TBW SQ every 24 hours based on BMI being >30.  DESCRIPTION: Pharmacy has adjusted enoxaparin dose per [facility] policy.  Patient is now receiving enoxaparin 0.5 mg/kg every 24 hours   [facility], PharmD, [facility] 06/12/2020 12:23 AM

## 2020-06-12 NOTE — ED Notes (Signed)
E-sign not working for procedure consent. Consent printed and signed and placed into medical records bin.

## 2020-06-12 NOTE — Progress Notes (Signed)
*  PRELIMINARY RESULTS* Echocardiogram 2D Echocardiogram has been performed.  Marcus Small 06/12/2020, 8:04 PM

## 2020-06-12 NOTE — ED Notes (Addendum)
Pt transported to CT then ICU on monitor by RN.  Pt resting, responds to verbal. BP improved after labetalol.

## 2020-06-12 NOTE — Progress Notes (Addendum)
Cardiology Consultation:   Patient ID: Marcus Small Posch MRN: [id number]; DOB: 11/12/1990  Admit date: 06/11/2020 Date of Consult: 06/12/2020  PCP:  Patient, No Pcp Per   [facility]  Cardiologist:  No primary care provider on file. new Advanced Practice Provider:  No care team member to display Electrophysiologist:  None  Patient Profile:   Marcus Small Marcus Small is a 30 y.o. male with a hx of alcohol abuse, anxiety, hypertension and depression who is being seen today for the evaluation of elevated troponin at the request of Dr. Lucianne MussKumar.  History of Present Illness:   Mr. Marcus Small is a [age] year old male with no prior cardiac history.  He has extensive psychiatric history including anxiety, depression, alcohol abuse and hypertension. He quit drinking about 2 to 3 days ago due to abdominal pain and was noted by his family to have a seizure today.  He reports poor oral intake.  CT abdomen showed evidence of pancreatitis which was confirmed by lipase.  We are consulted for elevated troponin.  EKG showed sinus tachycardia with nonspecific inferior ST depression. The patient reports severe abdominal pain that radiates up to his chest.  It is sharp in nature.  He does appear uncomfortable.  He has mild shortness of breath.  He reports no prior cardiac history. In addition, he does have significant leukocytosis.  Flu and Covid are both negative.   Past Medical History:  Diagnosis Date  . Anxiety   . Hypertension     History reviewed. No pertinent surgical history.   Home Medications:  Prior to Admission medications   Medication Sig Start Date End Date Taking? Authorizing Provider  Aspirin-Acetaminophen-Caffeine (GOODY HEADACHE PO) Take 1 packet by mouth as needed.   Yes [provider]  ibuprofen (ADVIL) 200 MG tablet Take 200 mg by mouth every 6 (six) hours as needed for headache.   Yes [provider]  albuterol (VENTOLIN HFA) 108 (90 Base) MCG/ACT  inhaler Inhale 2 puffs into the lungs every 6 (six) hours as needed for wheezing or shortness of breath. Patient not taking: No sig reported 12/02/18   Don PerkingVeronese, [place], MD  ketorolac (TORADOL) 10 MG tablet Take 1 tablet (10 mg total) by mouth every 8 (eight) hours as needed. Patient not taking: No sig reported 08/10/15   Darci CurrentBrown, [place], MD  meloxicam (MOBIC) 15 MG tablet Take 1 tablet (15 mg total) by mouth daily. Patient not taking: No sig reported 09/05/19   Cuthriell, Christiane HaJonathan D, PA-C  ondansetron (ZOFRAN ODT) 4 MG disintegrating tablet Take 1 tablet (4 mg total) by mouth every 8 (eight) hours as needed. Patient not taking: No sig reported 12/02/18   Nita SickleVeronese, [place], MD    Inpatient Medications: Scheduled Meds: . Chlorhexidine Gluconate Cloth  6 each Topical Daily  . enoxaparin (LOVENOX) injection  0.5 mg/kg Subcutaneous Q24H  . folic acid  1 mg Oral Daily  . multivitamin with minerals  1 tablet Oral Daily  . propofol  1 mg/kg Intravenous Once  . sodium chloride flush  3 mL Intravenous Q12H  . thiamine  100 mg Oral Daily   Or  . thiamine  100 mg Intravenous Daily   Continuous Infusions: . sodium chloride Stopped (06/12/20 1602)   PRN Meds: albuterol, HYDROmorphone (DILAUDID) injection, labetalol, LORazepam **OR** LORazepam, ondansetron (ZOFRAN) IV  Allergies:    Allergies  Allergen Reactions  . Peanut-Containing Drug Products Itching  . Shellfish Allergy     Social History:   Social History  Socioeconomic History  . Marital status: Single    Spouse name: Not on file  . Number of children: Not on file  . Years of education: Not on file  . Highest education level: Not on file  Occupational History  . Not on file  Tobacco Use  . Smoking status: Current Every Day Smoker    Packs/day: 0.50    Types: Cigarettes    Last attempt to quit: 04/17/2015    Years since quitting: 5.1  . Smokeless tobacco: Never Used  Substance and Sexual Activity  . Alcohol use: Yes     Alcohol/week: 18.0 standard drinks    Types: 18 Cans of beer per week    Comment: daily - beer plus 2 pints of liquor a day  . Drug use: Not on file  . Sexual activity: Not on file  Other Topics Concern  . Not on file  Social History Narrative  . Not on file   Social Determinants of Health   Financial Resource Strain: Not on file  Food Insecurity: Not on file  Transportation Needs: Not on file  Physical Activity: Not on file  Stress: Not on file  Social Connections: Not on file  Intimate Partner Violence: Not on file    Family History:    Family History  Problem Relation Age of Onset  . Hypertension Mother      ROS:  Please see the history of present illness.   All other ROS reviewed and negative.     Physical Exam/Data:   Vitals:   06/12/20 1300 06/12/20 1500 06/12/20 1600 06/12/20 1715  BP: (!) 143/98 128/77 (!) 154/107 (!) 158/85  Pulse: (!) 146 (!) 132 (!) 131 (!) 140  Resp: (!) 25 (!) 32 (!) 34 (!) 27  Temp:   99.2 F (37.3 C)   TempSrc:   Oral   SpO2: 99% 94% 96% 97%  Weight:      Height:        Intake/Output Summary (Last 24 hours) at 06/12/2020 1748 Last data filed at 06/12/2020 1715 Gross per 24 hour  Intake 6314.64 ml  Output 1650 ml  Net 4664.64 ml   Last 3 Weights 06/11/2020 09/05/2019 12/01/2018  Weight (lbs) 198 lb 6.6 oz 200 lb 179 lb 14.3 oz  Weight (kg) 90 kg 90.719 kg 81.6 kg     Body mass index is 33.02 kg/m.  General:  Well nourished, well developed, in mild distress due to pain HEENT: normal Lymph: no adenopathy Neck: no JVD Endocrine:  No thryomegaly Vascular: No carotid bruits; FA pulses 2+ bilaterally without bruits  Cardiac:  normal S1, S2 but tachycardic; RRR; no murmur  Lungs:  clear to auscultation bilaterally, no wheezing, rhonchi or rales  Abd: Distended and tender to touch. Ext: no edema Musculoskeletal:  No deformities, BUE and BLE strength normal and equal Skin: warm and dry  Neuro:  CNs 2-12 intact, no focal  abnormalities noted Psych:  Normal affect   EKG:  The EKG was personally reviewed and demonstrates: Sinus tachycardia with inferior ST depression Telemetry:  Telemetry was personally reviewed and demonstrates: Sinus tachycardia  Relevant CV Studies: Echocardiogram is pending  Laboratory Data:  High Sensitivity Troponin:   Recent Labs  Lab 06/11/20 2132 06/12/20 0017  TROPONINIHS 93* 214*     Chemistry Recent Labs  Lab 06/11/20 2132 06/12/20 0545  NA 138 137  K 2.8* 3.3*  CL 102 109  CO2 19* 19*  GLUCOSE 148* 115*  BUN 9 9  CREATININE 1.55* 1.12  CALCIUM 9.2 8.2*  GFRNONAA >60 >60  ANIONGAP 17* 9    Recent Labs  Lab 06/11/20 2132 06/12/20 0545  PROT 7.9 6.6  ALBUMIN 4.3 3.4*  AST 71* 61*  ALT 21 18  ALKPHOS 89 72  BILITOT 1.3* 2.2*   Hematology Recent Labs  Lab 06/11/20 2132 06/12/20 0545  WBC 23.5* 20.2*  RBC 4.96 4.66  HGB 15.0 14.4  HCT 45.6 41.9  MCV 91.9 89.9  MCH 30.2 30.9  MCHC 32.9 34.4  RDW 11.9 12.1  PLT 359 277   BNPNo results for input(s): BNP, PROBNP in the last 168 hours.  DDimer No results for input(s): DDIMER in the last 168 hours.   Radiology/Studies:  CT Head Wo Contrast  Result Date: 06/11/2020 CLINICAL DATA:  Seizure post alcohol withdrawal EXAM: CT CERVICAL SPINE WITHOUT CONTRAST TECHNIQUE: Multidetector CT imaging of the cervical spine was performed without intravenous contrast. Multiplanar CT image reconstructions were also generated. COMPARISON:  None. FINDINGS: Brain: No evidence of acute territorial infarction, hemorrhage, hydrocephalus,extra-axial collection or mass lesion/mass effect. Normal gray-white differentiation. Ventricles are normal in size and contour. Vascular: No hyperdense vessel or unexpected calcification. Skull: The skull is intact. No fracture or focal lesion identified. Sinuses/Orbits: The visualized paranasal sinuses and mastoid air cells are clear. The orbits and globes intact. Other: None Cervical spine:  Alignment: Physiologic Skull base and vertebrae: Visualized skull base is intact. No atlanto-occipital dissociation. The vertebral body heights are well maintained. No fracture or pathologic osseous lesion seen. Soft tissues and spinal canal: The visualized paraspinal soft tissues are unremarkable. No prevertebral soft tissue swelling is seen. The spinal canal is grossly unremarkable, no large epidural collection or significant canal narrowing. Disc levels:  No significant canal or neural foraminal narrowing. Upper chest: The lung apices are clear. Thoracic inlet is within normal limits. Other: None IMPRESSION: No acute intracranial abnormality. No acute fracture or malalignment of the spine. Electronically Signed   By: Jonna Clark M.Small.   On: 06/11/2020 23:41   CT Cervical Spine Wo Contrast  Result Date: 06/11/2020 CLINICAL DATA:  Seizure post alcohol withdrawal EXAM: CT CERVICAL SPINE WITHOUT CONTRAST TECHNIQUE: Multidetector CT imaging of the cervical spine was performed without intravenous contrast. Multiplanar CT image reconstructions were also generated. COMPARISON:  None. FINDINGS: Brain: No evidence of acute territorial infarction, hemorrhage, hydrocephalus,extra-axial collection or mass lesion/mass effect. Normal gray-white differentiation. Ventricles are normal in size and contour. Vascular: No hyperdense vessel or unexpected calcification. Skull: The skull is intact. No fracture or focal lesion identified. Sinuses/Orbits: The visualized paranasal sinuses and mastoid air cells are clear. The orbits and globes intact. Other: None Cervical spine: Alignment: Physiologic Skull base and vertebrae: Visualized skull base is intact. No atlanto-occipital dissociation. The vertebral body heights are well maintained. No fracture or pathologic osseous lesion seen. Soft tissues and spinal canal: The visualized paraspinal soft tissues are unremarkable. No prevertebral soft tissue swelling is seen. The spinal canal  is grossly unremarkable, no large epidural collection or significant canal narrowing. Disc levels:  No significant canal or neural foraminal narrowing. Upper chest: The lung apices are clear. Thoracic inlet is within normal limits. Other: None IMPRESSION: No acute intracranial abnormality. No acute fracture or malalignment of the spine. Electronically Signed   By: Jonna Clark M.Small.   On: 06/11/2020 23:41   CT ABDOMEN PELVIS W CONTRAST  Result Date: 06/11/2020 CLINICAL DATA:  Abdominal distension. EXAM: CT ABDOMEN AND PELVIS WITH CONTRAST TECHNIQUE: Multidetector CT imaging of the  abdomen and pelvis was performed using the standard protocol following bolus administration of intravenous contrast. CONTRAST:  OMNIPAQUE IOHEXOL 300 MG/ML  SOLN COMPARISON:  None. FINDINGS: Lower chest: There are trace bilateral pleural effusions.The heart size is mildly enlarged. Hepatobiliary: The liver is normal. Normal gallbladder.There is no biliary ductal dilation. Pancreas: There is peripancreatic fat stranding. The pancreas appears to be uniformly enhancing. Spleen: Unremarkable. Adrenals/Urinary Tract: --Adrenal glands: Unremarkable. --Right kidney/ureter: No hydronephrosis or radiopaque kidney stones. --Left kidney/ureter: No hydronephrosis or radiopaque kidney stones. --Urinary bladder: The urinary bladder is distended. Stomach/Bowel: --Stomach/Duodenum: No hiatal hernia or other gastric abnormality. Normal duodenal course and caliber. --Small bowel: Unremarkable. --Colon: Unremarkable. --Appendix: Normal. Vascular/Lymphatic: Normal course and caliber of the major abdominal vessels. --No retroperitoneal lymphadenopathy. --No mesenteric lymphadenopathy. --No pelvic or inguinal lymphadenopathy. Reproductive: Unremarkable Other: There is retroperitoneal free fluid. There is a small volume intraperitoneal free fluid. Musculoskeletal. No acute displaced fractures. IMPRESSION: 1. Findings consistent with acute interstitial  pancreatitis. Correlation with lipase is recommended. 2. Small volume intraperitoneal and retroperitoneal free fluid. 3. Trace bilateral pleural effusions. 4. Distended urinary bladder. Electronically Signed   By: Katherine Mantle M.Small.   On: 06/11/2020 23:41   CT Shoulder Left Wo Contrast  Result Date: 06/12/2020 CLINICAL DATA:  Shoulder trauma EXAM: CT OF THE UPPER LEFT EXTREMITY WITHOUT CONTRAST TECHNIQUE: Multidetector CT imaging of the upper left extremity was performed according to the standard protocol. COMPARISON:  Radiograph day FINDINGS: Bones/Joint/Cartilage There is been interval reduction of the anteroinferior dislocation. The humeral head now articulates with the glenoid. Again noted is comminuted impacted Hill-Sachs deformity of the posterolateral corner involving the anterior bicipital groove. The [facility] joint appears to be intact. Pain a small joint effusion is present. Ligaments Suboptimally assessed by CT. Muscles and Tendons Overlying deltoid musculature edema is seen. The remainder of the muscles appear to be intact. The rotator cuff appears to be grossly intact. Soft tissues Overlying subcutaneous edema seen. IMPRESSION: 1. Interval reduction of the anterior dislocation with the glenohumeral joint in near anatomic location. 2. Comminuted slightly impacted Hill-Sachs deformity which involve the anterior bicipital groove. Electronically Signed   By: Jonna Clark M.Small.   On: 06/12/2020 03:26   DG Chest Port 1 View  Result Date: 06/12/2020 CLINICAL DATA:  Seizure EXAM: PORTABLE CHEST 1 VIEW COMPARISON:  12/01/2018 FINDINGS: Lungs volumes are small, but are symmetric and are clear. No pneumothorax or pleural effusion. Cardiac size within normal limits. Pulmonary vascularity is normal. Osseous structures are age-appropriate. No acute bone abnormality. IMPRESSION: No active disease. Electronically Signed   By: Helyn Numbers MD   On: 06/12/2020 00:22   DG Shoulder Left  Result Date:  06/12/2020 CLINICAL DATA:  Pain EXAM: LEFT SHOULDER - 2+ VIEW COMPARISON:  None. FINDINGS: There is an anterior inferior glenohumeral fracture dislocation of the left humerus. There is a displaced fracture fragment measuring approximately 3.6 cm. IMPRESSION: Anterior inferior glenohumeral fracture dislocation of the left humerus. Electronically Signed   By: Katherine Mantle M.Small.   On: 06/12/2020 00:02     Assessment and Plan:   1. Elevated troponin: Likely supply demand ischemia in the setting of significant tachycardia in the setting of alcohol withdrawal, pancreatitis and possible underlying sepsis.  EKG with nonspecific inferior ST depression.  I do not recommend heparin especially with active pancreatitis.  I agree with an echocardiogram to evaluate LV systolic function and wall motion.  Given significant tachycardia and elevated blood pressure, I am going to place him on  metoprolol.  The patient reports prolonged history of tachycardia especially in the setting of anxiety. 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 3. Pancreatitis, he is getting an NG tube placed at the time of examination.    For questions or updates, please contact [facility] Please consult [email or web address] for contact info under    Signed, Lorine Bears, MD  06/12/2020 5:48 PM

## 2020-06-12 NOTE — Progress Notes (Signed)
Labetalol given for hypertension and tachycardia, did not meet CIWA criteria for ativan dose, available pain medication not due. Will continue to monitor.

## 2020-06-12 NOTE — ED Notes (Signed)
Per Dr Fuller Plan, hold ativan due to preparing for moderate sedation

## 2020-06-12 NOTE — H&P (Addendum)
History and Physical   Marcus Small PJA:[id number] DOB: 07-Nov-1990 DOA: 06/11/2020  PCP: Patient, No Pcp Per   Patient coming from: Home  Chief Complaint: Seizure  HPI: Marcus Small is a 30 y.o. male with medical history significant of alcohol use, anxiety, hypertension who presents following seizure at home.  Patient's family reports that he had a tonic-clonic seizure that lasted for about 1 minute.  Patient has a history of heavy alcohol use.  He states he quit drinking for the past 2 to 3 days due to ongoing abdominal pain, which is epigastric and rated as a 10 out of 10.  In that time he had been sleeping most of the day.  He also has been complaining of some left shoulder pain.  He also reports decreased p.o. intake.  He has not done much for his pain other than avoiding eating and resting.  He reports some constipation, nausea, vomiting. He denies fevers, chills, chest pain, shortness of breath, diarrhea. Some additional Hx from sister as below.  ED Course: Vital signs in the ED significant for tachycardia in the 120s to 140s, tachypnea in the 30s, blood pressure in the 150s to 200s systolic.  CMP showed potassium 2.8, bicarb 19, BUN 9, creatinine 1.55 from a baseline of 1, glucose 148, AST 71, T bili 1.3.  CBC showed leukocytosis to 23.  Lipase elevated at 629.  Initial troponin elevated at 93 with repeat pending.  Respiratory panel negative for flu and COVID.  Urinalysis and UDS pending.  Patient was negative for alcohol.  Blood cultures pending.  CT head was without acute abnormality, CT C-spine was without acute abnormality.  CT abdomen pelvis showed pancreatitis small volume of free fluid and distended bladder.  Left shoulder shoulder left shoulder dislocation chest x-ray showed no acute disease.  Given dose of broad-spectrum antibiotics in ED also given 3 L of IV fluids as well as Ativan.  He also has had his shoulder set.  Review of Systems: As per HPI otherwise all other  systems reviewed and are negative.  Past Medical History:  Diagnosis Date  . Anxiety   . Hypertension     History reviewed. No pertinent surgical history.  Social History  reports that he has been smoking cigarettes. He has been smoking about 0.50 packs per day. He has never used smokeless tobacco. He reports current alcohol use of about 18.0 standard drinks of alcohol per week. No history on file for drug use.  Allergies  Allergen Reactions  . Peanut-Containing Drug Products Itching  . Shellfish Allergy     Family History  Problem Relation Age of Onset  . Hypertension Mother   Reviewed on admission  Prior to Admission medications   Medication Sig Start Date End Date Taking? Authorizing Provider  albuterol (VENTOLIN HFA) 108 (90 Base) MCG/ACT inhaler Inhale 2 puffs into the lungs every 6 (six) hours as needed for wheezing or shortness of breath. 12/02/18   Nita Sickle, MD  Aspirin-Acetaminophen-Caffeine (GOODY HEADACHE PO) Take 1 packet by mouth as needed.    [provider]  ibuprofen (ADVIL,MOTRIN) 800 MG tablet Take 800 mg by mouth every 8 (eight) hours as needed for headache.    [provider]  ketorolac (TORADOL) 10 MG tablet Take 1 tablet (10 mg total) by mouth every 8 (eight) hours as needed. 08/10/15   Darci Current, MD  meloxicam (MOBIC) 15 MG tablet Take 1 tablet (15 mg total) by mouth daily. 09/05/19   Cuthriell, Christiane Ha  D, PA-C  ondansetron (ZOFRAN ODT) 4 MG disintegrating tablet Take 1 tablet (4 mg total) by mouth every 8 (eight) hours as needed. 12/02/18   Nita Sickle, MD    Physical Exam: Vitals:   06/11/20 2300 06/11/20 2359 06/12/20 0000 06/12/20 0014  BP: (!) 176/122  (!) 186/148 (!) 236/140  Pulse: (!) 120 (!) 126 (!) 124 (!) 133  Resp: (!) 36  (!) 28 (!) 32  Temp:      TempSrc:      SpO2: 100%  98% 98%  Weight:      Height:       Physical Exam Constitutional:      Appearance: He is ill-appearing.     Comments:  Drowsy in mild distress.  HENT:     Head: Normocephalic and atraumatic.     Mouth/Throat:     Mouth: Mucous membranes are moist.     Pharynx: Oropharynx is clear.  Eyes:     Extraocular Movements: Extraocular movements intact.     Pupils: Pupils are equal, round, and reactive to light.  Cardiovascular:     Rate and Rhythm: Regular rhythm. Tachycardia present.     Pulses: Normal pulses.     Heart sounds: Normal heart sounds.  Pulmonary:     Effort: Pulmonary effort is normal. No respiratory distress.     Breath sounds: Normal breath sounds.     Comments: Tachypnea Abdominal:     General: Bowel sounds are normal.     Palpations: Abdomen is soft.     Tenderness: There is abdominal tenderness.  Musculoskeletal:        General: No swelling or deformity.  Skin:    General: Skin is warm and dry.  Neurological:     General: No focal deficit present.     Mental Status: He is oriented to person, place, and time.     Comments: Drowsy, slow to answer questions, somewhat postictal    Labs on Admission: I have personally reviewed following labs and imaging studies  CBC: Recent Labs  Lab 06/11/20 2132  WBC 23.5*  NEUTROABS 21.7*  HGB 15.0  HCT 45.6  MCV 91.9  PLT 359    Basic Metabolic Panel: Recent Labs  Lab 06/11/20 2132  NA 138  K 2.8*  CL 102  CO2 19*  GLUCOSE 148*  BUN 9  CREATININE 1.55*  CALCIUM 9.2  MG 2.2    GFR: Estimated Creatinine Clearance: 73.2 mL/min (A) (by C-G formula based on SCr of 1.55 mg/dL (H)).  Liver Function Tests: Recent Labs  Lab 06/11/20 2132  AST 71*  ALT 21  ALKPHOS 89  BILITOT 1.3*  PROT 7.9  ALBUMIN 4.3    Urine analysis:    Component Value Date/Time   COLORURINE YELLOW (A) 06/15/2015 1546   APPEARANCEUR CLEAR (A) 06/15/2015 1546   APPEARANCEUR Clear 08/18/2012 1153   LABSPEC 1.017 06/15/2015 1546   LABSPEC 1.016 08/18/2012 1153   PHURINE 7.0 06/15/2015 1546   GLUCOSEU NEGATIVE 06/15/2015 1546   GLUCOSEU Negative  08/18/2012 1153   HGBUR NEGATIVE 06/15/2015 1546   BILIRUBINUR NEGATIVE 06/15/2015 1546   BILIRUBINUR Negative 08/18/2012 1153   KETONESUR NEGATIVE 06/15/2015 1546   PROTEINUR NEGATIVE 06/15/2015 1546   NITRITE NEGATIVE 06/15/2015 1546   LEUKOCYTESUR NEGATIVE 06/15/2015 1546   LEUKOCYTESUR Negative 08/18/2012 1153    Radiological Exams on Admission: CT Head Wo Contrast  Result Date: 06/11/2020 CLINICAL DATA:  Seizure post alcohol withdrawal EXAM: CT CERVICAL SPINE WITHOUT CONTRAST TECHNIQUE: Multidetector CT  imaging of the cervical spine was performed without intravenous contrast. Multiplanar CT image reconstructions were also generated. COMPARISON:  None. FINDINGS: Brain: No evidence of acute territorial infarction, hemorrhage, hydrocephalus,extra-axial collection or mass lesion/mass effect. Normal gray-white differentiation. Ventricles are normal in size and contour. Vascular: No hyperdense vessel or unexpected calcification. Skull: The skull is intact. No fracture or focal lesion identified. Sinuses/Orbits: The visualized paranasal sinuses and mastoid air cells are clear. The orbits and globes intact. Other: None Cervical spine: Alignment: Physiologic Skull base and vertebrae: Visualized skull base is intact. No atlanto-occipital dissociation. The vertebral body heights are well maintained. No fracture or pathologic osseous lesion seen. Soft tissues and spinal canal: The visualized paraspinal soft tissues are unremarkable. No prevertebral soft tissue swelling is seen. The spinal canal is grossly unremarkable, no large epidural collection or significant canal narrowing. Disc levels:  No significant canal or neural foraminal narrowing. Upper chest: The lung apices are clear. Thoracic inlet is within normal limits. Other: None IMPRESSION: No acute intracranial abnormality. No acute fracture or malalignment of the spine. Electronically Signed   By: Jonna ClarkBindu  Avutu M.D.   On: 06/11/2020 23:41   CT  Cervical Spine Wo Contrast  Result Date: 06/11/2020 CLINICAL DATA:  Seizure post alcohol withdrawal EXAM: CT CERVICAL SPINE WITHOUT CONTRAST TECHNIQUE: Multidetector CT imaging of the cervical spine was performed without intravenous contrast. Multiplanar CT image reconstructions were also generated. COMPARISON:  None. FINDINGS: Brain: No evidence of acute territorial infarction, hemorrhage, hydrocephalus,extra-axial collection or mass lesion/mass effect. Normal gray-white differentiation. Ventricles are normal in size and contour. Vascular: No hyperdense vessel or unexpected calcification. Skull: The skull is intact. No fracture or focal lesion identified. Sinuses/Orbits: The visualized paranasal sinuses and mastoid air cells are clear. The orbits and globes intact. Other: None Cervical spine: Alignment: Physiologic Skull base and vertebrae: Visualized skull base is intact. No atlanto-occipital dissociation. The vertebral body heights are well maintained. No fracture or pathologic osseous lesion seen. Soft tissues and spinal canal: The visualized paraspinal soft tissues are unremarkable. No prevertebral soft tissue swelling is seen. The spinal canal is grossly unremarkable, no large epidural collection or significant canal narrowing. Disc levels:  No significant canal or neural foraminal narrowing. Upper chest: The lung apices are clear. Thoracic inlet is within normal limits. Other: None IMPRESSION: No acute intracranial abnormality. No acute fracture or malalignment of the spine. Electronically Signed   By: Jonna ClarkBindu  Avutu M.D.   On: 06/11/2020 23:41   CT ABDOMEN PELVIS W CONTRAST  Result Date: 06/11/2020 CLINICAL DATA:  Abdominal distension. EXAM: CT ABDOMEN AND PELVIS WITH CONTRAST TECHNIQUE: Multidetector CT imaging of the abdomen and pelvis was performed using the standard protocol following bolus administration of intravenous contrast. CONTRAST:  100mL OMNIPAQUE IOHEXOL 300 MG/ML  SOLN COMPARISON:  None.  FINDINGS: Lower chest: There are trace bilateral pleural effusions.The heart size is mildly enlarged. Hepatobiliary: The liver is normal. Normal gallbladder.There is no biliary ductal dilation. Pancreas: There is peripancreatic fat stranding. The pancreas appears to be uniformly enhancing. Spleen: Unremarkable. Adrenals/Urinary Tract: --Adrenal glands: Unremarkable. --Right kidney/ureter: No hydronephrosis or radiopaque kidney stones. --Left kidney/ureter: No hydronephrosis or radiopaque kidney stones. --Urinary bladder: The urinary bladder is distended. Stomach/Bowel: --Stomach/Duodenum: No hiatal hernia or other gastric abnormality. Normal duodenal course and caliber. --Small bowel: Unremarkable. --Colon: Unremarkable. --Appendix: Normal. Vascular/Lymphatic: Normal course and caliber of the major abdominal vessels. --No retroperitoneal lymphadenopathy. --No mesenteric lymphadenopathy. --No pelvic or inguinal lymphadenopathy. Reproductive: Unremarkable Other: There is retroperitoneal free fluid. There is a small  volume intraperitoneal free fluid. Musculoskeletal. No acute displaced fractures. IMPRESSION: 1. Findings consistent with acute interstitial pancreatitis. Correlation with lipase is recommended. 2. Small volume intraperitoneal and retroperitoneal free fluid. 3. Trace bilateral pleural effusions. 4. Distended urinary bladder. Electronically Signed   By: Katherine Mantle M.D.   On: 06/11/2020 23:41   DG Chest Port 1 View  Result Date: 06/12/2020 CLINICAL DATA:  Seizure EXAM: PORTABLE CHEST 1 VIEW COMPARISON:  12/01/2018 FINDINGS: Lungs volumes are small, but are symmetric and are clear. No pneumothorax or pleural effusion. Cardiac size within normal limits. Pulmonary vascularity is normal. Osseous structures are age-appropriate. No acute bone abnormality. IMPRESSION: No active disease. Electronically Signed   By: Helyn Numbers MD   On: 06/12/2020 00:22   DG Shoulder Left  Result Date:  06/12/2020 CLINICAL DATA:  Pain EXAM: LEFT SHOULDER - 2+ VIEW COMPARISON:  None. FINDINGS: There is an anterior inferior glenohumeral fracture dislocation of the left humerus. There is a displaced fracture fragment measuring approximately 3.6 cm. IMPRESSION: Anterior inferior glenohumeral fracture dislocation of the left humerus. Electronically Signed   By: Katherine Mantle M.D.   On: 06/12/2020 00:02   EKG: Independently reviewed.  Sinus tachycardia 125 bpm, possible inferior ST depression but appears to be more like baseline wander.  Assessment/Plan Principal Problem:   Alcohol withdrawal seizure (HCC) Active Problems:   HTN (hypertension)   Alcohol withdrawal (HCC)   Alcohol use   Alcoholic pancreatitis   Leukocytosis   AKI (acute kidney injury) (HCC)   Hypokalemia   High anion gap metabolic acidosis   Closed dislocation of left shoulder   Elevated troponin   Tachycardia  Alcohol use Alcohol withdrawal Alcohol withdrawal seizures > Patient presents following seizure in the setting of stopping alcohol use after having abdominal pain for the last few days. > Alcohol level is negative in ED > CT head and C-spine were without acute abnormalities > No history of seizures, presentation consistent with alcohol withdrawal seizure. - Continue CIWA with Ativan, Stepdown Protocol - Thiamine - Multivitamin - Folate - Check lactic acid and trend - Add on Phos - Trend CMP  Pancreatitis Leukocytosis > Patient with evidence of pancreatitis on CT and lipase elevated to 629 in the setting of known heavy alcohol use also admitted for withdrawal and withdrawal seizure as above. > Receiving 3 L IV fluids in the ED > No other infectious etiology noted to explain his leukocytosis, seizure may have contributed to some reactive leukocytosis as well. - N.p.o., advance to clear liquids as tolerated - Continue IV fluids at 150 cc/h after initial 3L complete - As needed Dilaudid for 2 doses,  reassess tomorrow - Trend CBC  AKI Hypokalemia AGMA > Potassium of 2.8, Magnesium 2.2 > Creatinine elevated to 1.5 from a baseline of 1. > AGMA likely due to lactic acidosis in the setting of seizure - IV potassium 10 mEq x 6 - Trend renal function electrolytes - Trend lactic acid as above  Left shoulder dislocation and fracture > This occurred before the witnessed seizure, possibility that he had an unwitnessed 1 prior does not recall trauma - Shoulder is to be sent in ED.,  Not yet done when patient was seen.  Tachycardia Troponin elevation > Initial high-sensitivity troponin elevated to 93 in the setting of seizure and tachycardia, suspect demand ischemia. - Trend troponin - Continue IV fluids  Hypertension > In his problem list but no medications listed.  Blood pressure elevated in ED. - As needed labetalol, once bolus  and Ativan for withdrawal has been given  Depression > Sister states patient has been suffering from significant depression and thoughts of suicide recently. - This will need to be really assessed with patient as he becomes more alert - If he attempt to leave may need to be IVCD. - Will benefit from Psych consult while here  DVT prophylaxis: Lovenox  Code Status:   Full  Family Communication:  Sister updated at by phone at [phone].  She states she would like to be kept in the loop.  She states there may be multiple family members calling to check on him if this becomes a burden she states to have them contact her or her mother for updates.  Disposition Plan:   Patient is from:  Home  Anticipated DC to:  Home  Anticipated DC date:  1 to 3 days  Anticipated DC barriers: None  Consults called:  None  Admission status:  Observation, Stepdown  Severity of Illness: The appropriate patient status for this patient is OBSERVATION. Observation status is judged to be reasonable and necessary in order to provide the required intensity of service to ensure the  patient's safety. The patient's presenting symptoms, physical exam findings, and initial radiographic and laboratory data in the context of their medical condition is felt to place them at decreased risk for further clinical deterioration. Furthermore, it is anticipated that the patient will be medically stable for discharge from the hospital within 2 midnights of admission. The following factors support the patient status of observation.   " The patient's presenting symptoms include seizure, abdominal pain. " The physical exam findings include tachycardia, tachypnea, drowsiness. " The initial radiographic and laboratory data are leukocytosis to 23, potassium 2.8, creatinine 1.5, glucose 148, lipase 629, troponin 93.Synetta Fail MD [facility]  How to contact the [facility] Attending or Consulting provider 7A - 7P or covering provider during after hours 7P -7A, for this patient?   1. Check the care team in [facility] and look for a) attending/consulting [facility] provider listed and b) the [facility] team listed 2. Log into [email or web address] and use [facility]'s universal password to access. If you do not have the password, please contact the hospital operator. 3. Locate the [facility] provider you are looking for under [facility] and page to a number that you can be directly reached. 4. If you still have difficulty reaching the provider, please page the [facility] (Director on Call) for the Hospitalists listed on amion for assistance.  06/12/2020, 12:43 AM

## 2020-06-12 NOTE — ED Notes (Signed)
Phlebotomist at bedside for blood cultures and labs.

## 2020-06-12 NOTE — Progress Notes (Signed)
Patient had large volume clear liquid emesis. MD notified, orders received for IV ondansetron PRN. 4mg  ondansetron given. Patient bathed, linens changed, resting comfortably without complaint.

## 2020-06-13 DIAGNOSIS — I248 Other forms of acute ischemic heart disease: Secondary | ICD-10-CM

## 2020-06-13 DIAGNOSIS — K859 Acute pancreatitis without necrosis or infection, unspecified: Secondary | ICD-10-CM

## 2020-06-13 DIAGNOSIS — R000 Tachycardia, unspecified: Secondary | ICD-10-CM

## 2020-06-13 DIAGNOSIS — I10 Essential (primary) hypertension: Secondary | ICD-10-CM

## 2020-06-13 DIAGNOSIS — K8521 Alcohol induced acute pancreatitis with uninfected necrosis: Secondary | ICD-10-CM

## 2020-06-13 DIAGNOSIS — F10231 Alcohol dependence with withdrawal delirium: Principal | ICD-10-CM

## 2020-06-13 LAB — CBC
HCT: 37.9 % — ABNORMAL LOW (ref 39.0–52.0)
Hemoglobin: 12.2 g/dL — ABNORMAL LOW (ref 13.0–17.0)
MCH: 30.6 pg (ref 26.0–34.0)
MCHC: 32.2 g/dL (ref 30.0–36.0)
MCV: 95 fL (ref 80.0–100.0)
Platelets: 238 10*3/uL (ref 150–400)
RBC: 3.99 MIL/uL — ABNORMAL LOW (ref 4.22–5.81)
RDW: 12.6 % (ref 11.5–15.5)
WBC: 12.8 10*3/uL — ABNORMAL HIGH (ref 4.0–10.5)
nRBC: 0 % (ref 0.0–0.2)

## 2020-06-13 LAB — ECHOCARDIOGRAM COMPLETE
AR max vel: 2.41 cm2
AV Peak grad: 4.5 mmHg
Ao pk vel: 1.06 m/s
Area-P 1/2: 5.75 cm2
Height: 65 in
S' Lateral: 3.4 cm
Weight: 3174.62 oz

## 2020-06-13 LAB — TSH: TSH: 1.913 u[IU]/mL (ref 0.350–4.500)

## 2020-06-13 LAB — BASIC METABOLIC PANEL
Anion gap: 11 (ref 5–15)
BUN: 9 mg/dL (ref 6–20)
CO2: 19 mmol/L — ABNORMAL LOW (ref 22–32)
Calcium: 8.4 mg/dL — ABNORMAL LOW (ref 8.9–10.3)
Chloride: 112 mmol/L — ABNORMAL HIGH (ref 98–111)
Creatinine, Ser: 1.14 mg/dL (ref 0.61–1.24)
GFR, Estimated: >60 mL/min (ref >60)
Glucose, Bld: 75 mg/dL (ref 70–99)
Potassium: 3.8 mmol/L (ref 3.5–5.1)
Sodium: 142 mmol/L (ref 135–145)

## 2020-06-13 LAB — GLUCOSE, CAPILLARY
Glucose-Capillary: 108 mg/dL — ABNORMAL HIGH (ref 70–99)
Glucose-Capillary: 57 mg/dL — ABNORMAL LOW (ref 70–99)
Glucose-Capillary: 73 mg/dL (ref 70–99)
Glucose-Capillary: 73 mg/dL (ref 70–99)
Glucose-Capillary: 83 mg/dL (ref 70–99)
Glucose-Capillary: 84 mg/dL (ref 70–99)
Glucose-Capillary: 93 mg/dL (ref 70–99)
Glucose-Capillary: 99 mg/dL (ref 70–99)

## 2020-06-13 LAB — LIPASE, BLOOD: Lipase: 108 U/L — ABNORMAL HIGH (ref 11–51)

## 2020-06-13 LAB — PHOSPHORUS: Phosphorus: 3.4 mg/dL (ref 2.5–4.6)

## 2020-06-13 LAB — MAGNESIUM: Magnesium: 2.2 mg/dL (ref 1.7–2.4)

## 2020-06-13 MED ORDER — DEXMEDETOMIDINE HCL IN NACL 400 MCG/100ML IV SOLN
0.4000 ug/kg/h | INTRAVENOUS | Status: DC
  Administered 2020-06-13: 0.4 ug/kg/h via INTRAVENOUS
  Administered 2020-06-13: 0.5 ug/kg/h via INTRAVENOUS
  Administered 2020-06-14: 0.8 ug/kg/h via INTRAVENOUS
  Administered 2020-06-14: 1 ug/kg/h via INTRAVENOUS
  Administered 2020-06-14: 0.7 ug/kg/h via INTRAVENOUS
  Administered 2020-06-14: 0.4 ug/kg/h via INTRAVENOUS
  Administered 2020-06-14: 0.8 ug/kg/h via INTRAVENOUS
  Administered 2020-06-15 (×5): 1 ug/kg/h via INTRAVENOUS
  Administered 2020-06-16: 1.2 ug/kg/h via INTRAVENOUS
  Administered 2020-06-16: 1 ug/kg/h via INTRAVENOUS
  Administered 2020-06-16 (×3): 1.2 ug/kg/h via INTRAVENOUS
  Administered 2020-06-16: 1 ug/kg/h via INTRAVENOUS
  Administered 2020-06-17 (×2): 1.2 ug/kg/h via INTRAVENOUS
  Administered 2020-06-17: 1 ug/kg/h via INTRAVENOUS
  Administered 2020-06-17: 1.2 ug/kg/h via INTRAVENOUS
  Administered 2020-06-17: 1 ug/kg/h via INTRAVENOUS
  Administered 2020-06-17: 1.2 ug/kg/h via INTRAVENOUS
  Administered 2020-06-18 (×6): 1 ug/kg/h via INTRAVENOUS
  Administered 2020-06-19 (×2): 1.2 ug/kg/h via INTRAVENOUS
  Administered 2020-06-19: 1 ug/kg/h via INTRAVENOUS
  Administered 2020-06-19 – 2020-06-21 (×14): 1.2 ug/kg/h via INTRAVENOUS
  Filled 2020-06-13 (×47): qty 100

## 2020-06-13 MED ORDER — DEXTROSE 50 % IV SOLN: 50.0000 mL | Freq: Once | INTRAVENOUS | Status: AC

## 2020-06-13 MED ORDER — DEXTROSE 50 % IV SOLN
INTRAVENOUS | Status: AC
  Administered 2020-06-13: 50 mL
  Filled 2020-06-13: qty 50

## 2020-06-13 MED ORDER — PANTOPRAZOLE SODIUM 40 MG IV SOLR
40.0000 mg | INTRAVENOUS | Status: DC
  Administered 2020-06-13 – 2020-06-21 (×9): 40 mg via INTRAVENOUS
  Filled 2020-06-13 (×9): qty 40

## 2020-06-13 MED ORDER — ACETAMINOPHEN 650 MG RE SUPP
650.0000 mg | Freq: Four times a day (QID) | RECTAL | Status: DC | PRN
  Administered 2020-06-17 (×2): 650 mg via RECTAL
  Filled 2020-06-13 (×2): qty 1

## 2020-06-13 MED ORDER — METOPROLOL TARTRATE 5 MG/5ML IV SOLN
7.5000 mg | Freq: Four times a day (QID) | INTRAVENOUS | Status: DC
  Administered 2020-06-13 – 2020-06-20 (×17): 7.5 mg via INTRAVENOUS
  Filled 2020-06-13 (×22): qty 10

## 2020-06-13 MED ORDER — DEXTROSE-NACL 5-0.9 % IV SOLN: INTRAVENOUS | Status: DC

## 2020-06-13 MED ORDER — LACTATED RINGERS IV SOLN: INTRAVENOUS | Status: DC

## 2020-06-13 MED ORDER — ACETAMINOPHEN 10 MG/ML IV SOLN
1000.0000 mg | Freq: Once | INTRAVENOUS | Status: AC
  Administered 2020-06-13: 1000 mg via INTRAVENOUS
  Filled 2020-06-13: qty 100

## 2020-06-13 NOTE — Progress Notes (Signed)
Neuro: A & O x4, follows commands, will often quickly fall back asleep to snoring if no conversation occurring, continuing to give ativan for tachycardia/HTN under CIWA instructions Resp: patient placed on 2L [place] due to O2 sats dropping into the 80s following dilaudid administration for complaints of pain CV: TMAX 100.5-IV tylenol given with resolution of fever, remains hypertensive and tachycardic throughout the shift without much relief from medications GIGU: NG remains in place on LIS, patient drinking water well with caution due to sleepiness, no BM, passing gas, voiding per urinal Skin: clean dry and intact Social: calls from multiple male family/friends, update given only to sister, Odessa Fleming

## 2020-06-13 NOTE — Consult Note (Signed)
Consult received. Patient will be seen priority tomorrow.

## 2020-06-13 NOTE — Progress Notes (Signed)
Progress Note  Patient Name: Marcus Small Date of Encounter: 06/13/2020  [facility] Cardiologist: Cathlyn Parsons   Subjective   Patient is thirsty.  Complains of shoulder and abdominal pain.  No chest pain or shortness of breath.  Inpatient Medications    Scheduled Meds: . Chlorhexidine Gluconate Cloth  6 each Topical Daily  . enoxaparin (LOVENOX) injection  0.5 mg/kg Subcutaneous Q24H  . folic acid  1 mg Oral Daily  . metoprolol tartrate  5 mg Intravenous Q6H  . multivitamin with minerals  1 tablet Oral Daily  . propofol  1 mg/kg Intravenous Once  . sodium chloride flush  3 mL Intravenous Q12H  . thiamine  100 mg Oral Daily   Or  . thiamine  100 mg Intravenous Daily   Continuous Infusions: . lactated ringers 150 mL/hr at 06/13/20 0800   PRN Meds: albuterol, HYDROmorphone (DILAUDID) injection, labetalol, LORazepam **OR** LORazepam, ondansetron (ZOFRAN) IV   Vital Signs    Vitals:   06/13/20 0600 06/13/20 0700 06/13/20 0800 06/13/20 0806  BP: (!) 145/111   (!) 161/115  Pulse: (!) 118 (!) 125 (!) 130 (!) 129  Resp: 19 (!) 24 (!) 25 (!) 25  Temp:   99.4 F (37.4 C)   TempSrc:   Oral   SpO2: 99% 99% 99% 99%  Weight:      Height:        Intake/Output Summary (Last 24 hours) at 06/13/2020 0855 Last data filed at 06/13/2020 0800 Gross per 24 hour  Intake 6903.41 ml  Output 5100 ml  Net 1803.41 ml   Last 3 Weights 06/11/2020 09/05/2019 12/01/2018  Weight (lbs) 198 lb 6.6 oz 200 lb 179 lb 14.3 oz  Weight (kg) 90 kg 90.719 kg 81.6 kg      Telemetry    Sinus tachycardia - Personally Reviewed  ECG    No new tracing  Physical Exam   GEN: Somnolent but arousible Neck: Unable to assess JVP due to positioning and sling. Cardiac: Tachycardic but regular; no murmurs, rubs, or gallops.  Respiratory: Clear anteriorly. GI: Firm and distended with epigastric tenderness MS: No edema; No deformity. Neuro:  Nonfocal  Psych: Flat affect.  Labs    High  Sensitivity Troponin:   Recent Labs  Lab 06/11/20 2132 06/12/20 0017  TROPONINIHS 93* 214*      Chemistry Recent Labs  Lab 06/11/20 2132 06/12/20 0545 06/13/20 0717  NA 138 137 142  K 2.8* 3.3* 3.8  CL 102 109 112*  CO2 19* 19* 19*  GLUCOSE 148* 115* 75  BUN 9 9 9   CREATININE 1.55* 1.12 1.14  CALCIUM 9.2 8.2* 8.4*  PROT 7.9 6.6  --   ALBUMIN 4.3 3.4*  --   AST 71* 61*  --   ALT 21 18  --   ALKPHOS 89 72  --   BILITOT 1.3* 2.2*  --   GFRNONAA >60 >60 >60  ANIONGAP 17* 9 11     Hematology Recent Labs  Lab 06/11/20 2132 06/12/20 0545 06/13/20 0717  WBC 23.5* 20.2* 12.8*  RBC 4.96 4.66 3.99*  HGB 15.0 14.4 12.2*  HCT 45.6 41.9 37.9*  MCV 91.9 89.9 95.0  MCH 30.2 30.9 30.6  MCHC 32.9 34.4 32.2  RDW 11.9 12.1 12.6  PLT 359 277 238    BNPNo results for input(s): BNP, PROBNP in the last 168 hours.   DDimer No results for input(s): DDIMER in the last 168 hours.   Radiology  DG Abd 1 View  Result Date: 06/12/2020 CLINICAL DATA:  NG tube placement EXAM: ABDOMEN - 1 VIEW COMPARISON:  CT 05/22/2020 FINDINGS: Esophageal tube tip projects over the gastric outlet. Bowel gas pattern is unobstructed. IMPRESSION: Esophageal tube tip projects over the gastric outlet. Electronically Signed   By: Jasmine Pang M.D.   On: 06/12/2020 19:03   CT Head Wo Contrast  Result Date: 06/11/2020 CLINICAL DATA:  Seizure post alcohol withdrawal EXAM: CT CERVICAL SPINE WITHOUT CONTRAST TECHNIQUE: Multidetector CT imaging of the cervical spine was performed without intravenous contrast. Multiplanar CT image reconstructions were also generated. COMPARISON:  None. FINDINGS: Brain: No evidence of acute territorial infarction, hemorrhage, hydrocephalus,extra-axial collection or mass lesion/mass effect. Normal gray-white differentiation. Ventricles are normal in size and contour. Vascular: No hyperdense vessel or unexpected calcification. Skull: The skull is intact. No fracture or focal lesion  identified. Sinuses/Orbits: The visualized paranasal sinuses and mastoid air cells are clear. The orbits and globes intact. Other: None Cervical spine: Alignment: Physiologic Skull base and vertebrae: Visualized skull base is intact. No atlanto-occipital dissociation. The vertebral body heights are well maintained. No fracture or pathologic osseous lesion seen. Soft tissues and spinal canal: The visualized paraspinal soft tissues are unremarkable. No prevertebral soft tissue swelling is seen. The spinal canal is grossly unremarkable, no large epidural collection or significant canal narrowing. Disc levels:  No significant canal or neural foraminal narrowing. Upper chest: The lung apices are clear. Thoracic inlet is within normal limits. Other: None IMPRESSION: No acute intracranial abnormality. No acute fracture or malalignment of the spine. Electronically Signed   By: Jonna Clark M.D.   On: 06/11/2020 23:41   CT Cervical Spine Wo Contrast  Result Date: 06/11/2020 CLINICAL DATA:  Seizure post alcohol withdrawal EXAM: CT CERVICAL SPINE WITHOUT CONTRAST TECHNIQUE: Multidetector CT imaging of the cervical spine was performed without intravenous contrast. Multiplanar CT image reconstructions were also generated. COMPARISON:  None. FINDINGS: Brain: No evidence of acute territorial infarction, hemorrhage, hydrocephalus,extra-axial collection or mass lesion/mass effect. Normal gray-white differentiation. Ventricles are normal in size and contour. Vascular: No hyperdense vessel or unexpected calcification. Skull: The skull is intact. No fracture or focal lesion identified. Sinuses/Orbits: The visualized paranasal sinuses and mastoid air cells are clear. The orbits and globes intact. Other: None Cervical spine: Alignment: Physiologic Skull base and vertebrae: Visualized skull base is intact. No atlanto-occipital dissociation. The vertebral body heights are well maintained. No fracture or pathologic osseous lesion seen.  Soft tissues and spinal canal: The visualized paraspinal soft tissues are unremarkable. No prevertebral soft tissue swelling is seen. The spinal canal is grossly unremarkable, no large epidural collection or significant canal narrowing. Disc levels:  No significant canal or neural foraminal narrowing. Upper chest: The lung apices are clear. Thoracic inlet is within normal limits. Other: None IMPRESSION: No acute intracranial abnormality. No acute fracture or malalignment of the spine. Electronically Signed   By: Jonna Clark M.D.   On: 06/11/2020 23:41   CT ABDOMEN PELVIS W CONTRAST  Result Date: 06/11/2020 CLINICAL DATA:  Abdominal distension. EXAM: CT ABDOMEN AND PELVIS WITH CONTRAST TECHNIQUE: Multidetector CT imaging of the abdomen and pelvis was performed using the standard protocol following bolus administration of intravenous contrast. CONTRAST:  OMNIPAQUE IOHEXOL 300 MG/ML  SOLN COMPARISON:  None. FINDINGS: Lower chest: There are trace bilateral pleural effusions.The heart size is mildly enlarged. Hepatobiliary: The liver is normal. Normal gallbladder.There is no biliary ductal dilation. Pancreas: There is peripancreatic fat stranding. The pancreas appears to be  uniformly enhancing. Spleen: Unremarkable. Adrenals/Urinary Tract: --Adrenal glands: Unremarkable. --Right kidney/ureter: No hydronephrosis or radiopaque kidney stones. --Left kidney/ureter: No hydronephrosis or radiopaque kidney stones. --Urinary bladder: The urinary bladder is distended. Stomach/Bowel: --Stomach/Duodenum: No hiatal hernia or other gastric abnormality. Normal duodenal course and caliber. --Small bowel: Unremarkable. --Colon: Unremarkable. --Appendix: Normal. Vascular/Lymphatic: Normal course and caliber of the major abdominal vessels. --No retroperitoneal lymphadenopathy. --No mesenteric lymphadenopathy. --No pelvic or inguinal lymphadenopathy. Reproductive: Unremarkable Other: There is retroperitoneal free fluid. There is  a small volume intraperitoneal free fluid. Musculoskeletal. No acute displaced fractures. IMPRESSION: 1. Findings consistent with acute interstitial pancreatitis. Correlation with lipase is recommended. 2. Small volume intraperitoneal and retroperitoneal free fluid. 3. Trace bilateral pleural effusions. 4. Distended urinary bladder. Electronically Signed   By: Katherine Mantle M.D.   On: 06/11/2020 23:41   CT Shoulder Left Wo Contrast  Result Date: 06/12/2020 CLINICAL DATA:  Shoulder trauma EXAM: CT OF THE UPPER LEFT EXTREMITY WITHOUT CONTRAST TECHNIQUE: Multidetector CT imaging of the upper left extremity was performed according to the standard protocol. COMPARISON:  Radiograph day FINDINGS: Bones/Joint/Cartilage There is been interval reduction of the anteroinferior dislocation. The humeral head now articulates with the glenoid. Again noted is comminuted impacted Hill-Sachs deformity of the posterolateral corner involving the anterior bicipital groove. The [facility] joint appears to be intact. Pain a small joint effusion is present. Ligaments Suboptimally assessed by CT. Muscles and Tendons Overlying deltoid musculature edema is seen. The remainder of the muscles appear to be intact. The rotator cuff appears to be grossly intact. Soft tissues Overlying subcutaneous edema seen. IMPRESSION: 1. Interval reduction of the anterior dislocation with the glenohumeral joint in near anatomic location. 2. Comminuted slightly impacted Hill-Sachs deformity which involve the anterior bicipital groove. Electronically Signed   By: Jonna Clark M.D.   On: 06/12/2020 03:26   DG Chest Port 1 View  Result Date: 06/12/2020 CLINICAL DATA:  Seizure EXAM: PORTABLE CHEST 1 VIEW COMPARISON:  12/01/2018 FINDINGS: Lungs volumes are small, but are symmetric and are clear. No pneumothorax or pleural effusion. Cardiac size within normal limits. Pulmonary vascularity is normal. Osseous structures are age-appropriate. No acute bone  abnormality. IMPRESSION: No active disease. Electronically Signed   By: Helyn Numbers MD   On: 06/12/2020 00:22   DG Shoulder Left  Result Date: 06/12/2020 CLINICAL DATA:  Pain EXAM: LEFT SHOULDER - 2+ VIEW COMPARISON:  None. FINDINGS: There is an anterior inferior glenohumeral fracture dislocation of the left humerus. There is a displaced fracture fragment measuring approximately 3.6 cm. IMPRESSION: Anterior inferior glenohumeral fracture dislocation of the left humerus. Electronically Signed   By: Katherine Mantle M.D.   On: 06/12/2020 00:02   ECHOCARDIOGRAM COMPLETE  Result Date: 06/13/2020    ECHOCARDIOGRAM REPORT   Patient Name:   Marcus Small Date of Exam: 06/12/2020 Medical Rec #:  [id number]          Height:       65.0 in Accession #:    [id number]         Weight:       198.4 lb Date of Birth:  May 11, 1990           BSA:          1.971 m Patient Age:    29 years           BP:           158/85 mmHg Patient Gender: M  HR:           140 bpm. Exam Location:  [facility] Procedure: 2D Echo, Cardiac Doppler and Color Doppler Indications:     Chest Pain 786.50 / R07.9  History:         Patient has no prior history of Echocardiogram examinations.                  Risk Factors:Hypertension.  Sonographer:     Neysa Bonito Roar Referring Phys:  [id number] PARDEEP JYNWG Diagnosing Phys: Yvonne Kendall MD IMPRESSIONS  1. Left ventricular ejection fraction, by estimation, is 70 to 75%. The left ventricle has hyperdynamic function. Left ventricular endocardial border not optimally defined to evaluate regional wall motion. There is mild left ventricular hypertrophy. Indeterminate diastolic filling due to E-A fusion.  2. Right ventricular systolic function is normal. The right ventricular size is normal. Tricuspid regurgitation signal is inadequate for assessing PA pressure.  3. The mitral valve is grossly normal. No evidence of mitral valve regurgitation. No evidence of mitral stenosis.  4. The aortic valve has  an indeterminant number of cusps. Aortic valve regurgitation is not visualized. No aortic stenosis is present. FINDINGS  Left Ventricle: Left ventricular ejection fraction, by estimation, is 70 to 75%. The left ventricle has hyperdynamic function. Left ventricular endocardial border not optimally defined to evaluate regional wall motion. The left ventricular internal cavity size was normal in size. There is mild left ventricular hypertrophy. Indeterminate diastolic filling due to E-A fusion. Right Ventricle: The right ventricular size is normal. No increase in right ventricular wall thickness. Right ventricular systolic function is normal. Tricuspid regurgitation signal is inadequate for assessing PA pressure. Left Atrium: Left atrial size was normal in size. Right Atrium: Right atrial size was normal in size. Pericardium: There is no evidence of pericardial effusion. Mitral Valve: The mitral valve is grossly normal. No evidence of mitral valve regurgitation. No evidence of mitral valve stenosis. Tricuspid Valve: The tricuspid valve is not well visualized. Tricuspid valve regurgitation is trivial. Aortic Valve: The aortic valve has an indeterminant number of cusps. Aortic valve regurgitation is not visualized. No aortic stenosis is present. Aortic valve peak gradient measures 4.5 mmHg. Pulmonic Valve: The pulmonic valve was not well visualized. Pulmonic valve regurgitation is not visualized. No evidence of pulmonic stenosis. Aorta: The aortic root is normal in size and structure. Pulmonary Artery: The pulmonary artery is not well seen. Venous: The inferior vena cava was not well visualized. IAS/Shunts: The interatrial septum was not well visualized.  LEFT VENTRICLE PLAX 2D LVIDd:         4.40 cm  Diastology LVIDs:         3.40 cm  LV e' medial:    9.14 cm/s LV PW:         1.00 cm  LV E/e' medial:  5.6 LV IVS:        1.30 cm  LV e' lateral:   6.96 cm/s LVOT diam:     2.00 cm  LV E/e' lateral: 7.3 LVOT Area:     3.14  cm  RIGHT VENTRICLE RV Mid diam:    2.80 cm RV S prime:     19.60 cm/s TAPSE (M-mode): 2.4 cm LEFT ATRIUM             Index       RIGHT ATRIUM           Index LA diam:        3.50 cm 1.78 cm/m  RA  Area:     11.50 cm LA Vol (A2C):   28.7 ml 14.56 ml/m RA Volume:   23.40 ml  11.87 ml/m LA Vol (A4C):   39.2 ml 19.88 ml/m LA Biplane Vol: 35.7 ml 18.11 ml/m  AORTIC VALVE                PULMONIC VALVE AV Area (Vmax): 2.41 cm    PV Vmax:        1.10 m/s AV Vmax:        106.00 cm/s PV Peak grad:   4.8 mmHg AV Peak Grad:   4.5 mmHg    RVOT Peak grad: 4 mmHg LVOT Vmax:      81.40 cm/s  AORTA Ao Root diam: 2.60 cm MITRAL VALVE MV Area (PHT): 5.75 cm    SHUNTS MV Decel Time: 132 msec    Systemic Diam: 2.00 cm MV E velocity: 51.00 cm/s MV A velocity: 82.30 cm/s MV E/A ratio:  0.62 MV A Prime:    12.1 cm/s Yvonne Kendallhristopher Rayonna Heldman MD Electronically signed by Yvonne Kendallhristopher Verneda Hollopeter MD Signature Date/Time: 06/13/2020/7:11:24 AM    Final     Cardiac Studies   See TTE above.  Patient Profile     30 y.o. male man with h/o of alcohol abuse, anxiety, hypertension and depression, whom we are seeing for evaluation of elevated troponin.  Assessment & Plan    Demand ischemia: No chest pain reported.  HS-TnI still uptrending on last check (214 at 0017 yesterday morning).  Suspect this is due to demand ischemia in the setting of acute pancreatitis and EtOH withdrawal.  Echo with hyperdynamic LVEF.  No further w/u at this time.  Defer IV heparin in the setting of acute pancreatitis.  Titrate up metoprolol.  Sinus tachycardia and hypertension: Likely physiologic in the setting of alcohol withdrawal and acute pancreatitis.  Increase metoprolol to 7.5 mg IV Q6 hours for HR and blood pressure control.  [facility] will sign off.   Medication Recommendations:  Increase metoprolol to 7.5 mg Q6 hours Other recommendations (labs, testing, etc):  None Follow up as an outpatient:  2-4 weeks after hospital d/c with Dr. Kirke CorinArida or  APP.  For questions or updates, please contact [facility] Please consult [email or web address] for contact info under [facility].     Signed, Yvonne Kendallhristopher Henning Ehle, MD  06/13/2020, 8:55 AM

## 2020-06-13 NOTE — Progress Notes (Addendum)
PROGRESS NOTE    Clinton SawyerJacques D Lacewell  ZOX:[id number] DOB: 12/31/1990 DOA: 06/11/2020 PCP: Patient, No Pcp Per    Brief Narrative:  This 30 years old male with PMH significant for alcohol use, anxiety, hypertension presents with seizures at home. Family reports that patient had a tonic-clonic seizure that lasted for about 1 minute.  Family also reported history of heavy alcohol use.  Patient quit drinking for past 2 to 3 days due to ongoing abdominal pain, in that time he had been sleeping most of the day,  He also been complaining about left shoulder pain lately.  He was found to be tachycardic, tachypneic and hypertensive in the ED.  Labs include potassium 2.8, bicarb 19, BUN 9, creatinine 1.55, Lipase elevated at 629, initial troponin 93 trending up to 236.  Urine drug screen negative.  CT head without acute abnormality.  CT abdomen showed pancreatitis, small volume free fluid and distended bladder.  X-ray left shoulder showed dislocation.  Patient was given a dose of broad-spectrum antibiotics, IV fluids and Ativan in the ED. Patient is admitted for alcohol withdrawal seizures, alcohol level negative.  Patient will be continued with CIWA protocol with Ativan, He is also found to have pancreatitis,  will be kept NPO with IV fluids, there is no infectious etiology noted to explain leukocytosis. He is continued on IV fluids for acute kidney injury.  Cardiology was consulted for slightly elevated troponin which could be in the setting of seizures,  suspect demand ischemia,  2D echocardiogram hyperdynamic EF. Patient was agitated and restless requiring Precedex drip. Psych consulted for history of significant depression and recent thoughts of suicide at home.   Assessment & Plan:   Principal Problem:   Alcohol withdrawal seizure (HCC) Active Problems:   HTN (hypertension)   Alcohol withdrawal (HCC)   Alcohol use   Alcoholic pancreatitis   Leukocytosis   AKI (acute kidney injury) (HCC)    Hypokalemia   High anion gap metabolic acidosis   Closed dislocation of left shoulder   Elevated troponin   Tachycardia   Alcohol withdrawal seizures Patient presents following seizure in the setting of stopping alcohol use after having abdominal pain for the last few days. Alcohol level is negative in ED CT head and C-spine were without acute abnormalities No history of seizures, presentation consistent with alcohol withdrawal seizure. Continue CIWA with Ativan, Stepdown Protocol Continue thiamine Continue folate Lactic acid normal, sepsis ruled out. Patient was restless and agitated pulling IV line and NG tube requiring Precedex drip.  Pancreatitis : Leukocytosis Patient with evidence of pancreatitis on CT and lipase elevated to 629 in the setting of known heavy alcohol use also admitted for withdrawal and withdrawal seizure as above. Continue IV fluids. No other infectious etiology noted to explain his leukocytosis, seizure may have contributed to some reactive leukocytosis as well. Continue NPO, patient had significant pain, NG decompression. Continue IV fluids at 150 cc/h after initial 3L complete Continue Dilaudid 1 mg every 4 hours as needed for pain   AKI / Hypokalemia / AGMA AKI resolved with IV hydration. Hypokalemia improved. AGMA resolved.  Left shoulder dislocation and fracture This occurred before the witnessed seizure, possibility that he had an unwitnessed 1 prior does not recall trauma Orthopedic consulted recommended Nonsurgical management with the arm in a sling and nonweightbearing on the left upper extremity  Tachycardia/Troponin elevation Initial high-sensitivity troponin elevated to 93 in the setting of seizure and tachycardia, suspect demand ischemia. Opponent trending up, cardiology consulted.  Suspect demand  ischemia in the setting of pancreatitis. Continue Metoprolol 7.5 mg every 6 hours.  Hypertension: Blood pressure elevated in  ED. Metoprolol 7.5 mg IV every 6 hours.  Depression: > Sister states patient has been suffering from significant depression and has thoughts of suicide recently. - This will need to be really assessed with patient as he becomes more alert - If he attempt to leave may need to be IVCD. Psych consulted,  will follow up recommendation.   DVT prophylaxis: Lovenox Code Status: Full code Family Communication: spoke with sister Disposition Plan:   Status is: Inpatient  Remains inpatient appropriate because:Inpatient level of care appropriate due to severity of illness   Dispo: The patient is from: Home              Anticipated d/c is to: Home              Patient currently is not medically stable to d/c.   Difficult to place patient No   Consultants:   Cardiology  Procedures:  NG tube Antimicrobials:  Anti-infectives (From admission, onward)   Start     Dose/Rate Route Frequency Ordered Stop   06/12/20 0015  vancomycin (VANCOCIN) IVPB 1000 mg/200 mL premix       "Followed by" Linked Group Details   1,000 mg 200 mL/hr over 60 Minutes Intravenous  Once 06/12/20 0002 06/12/20 0313   06/12/20 0015  vancomycin (VANCOREADY) IVPB 1250 mg/250 mL       "Followed by" Linked Group Details   1,250 mg 166.7 mL/hr over 90 Minutes Intravenous  Once 06/12/20 0002 06/12/20 0631   06/12/20 0000  ceFEPIme (MAXIPIME) 2 g in sodium chloride 0.9 % 100 mL IVPB        2 g 200 mL/hr over 30 Minutes Intravenous  Once 06/11/20 2352 06/12/20 0115   06/12/20 0000  metroNIDAZOLE (FLAGYL) IVPB 500 mg        500 mg 100 mL/hr over 60 Minutes Intravenous  Once 06/11/20 2352 06/12/20 0232   06/12/20 0000  vancomycin (VANCOCIN) IVPB 1000 mg/200 mL premix  Status:  Discontinued        1,000 mg 200 mL/hr over 60 Minutes Intravenous  Once 06/11/20 2352 06/12/20 0000     Subjective: Patient was seen and examined at bedside.  He was very agitated and restless,  trying to get out of the bed,  pulling IV line  NG tube.  Patient was started on Precedex drip.  Objective: Vitals:   06/13/20 1400 06/13/20 1500 06/13/20 1600 06/13/20 1605  BP: (!) 163/100 (!) 168/96 (!) 149/67   Pulse: (!) 134 (!) 145 (!) 153   Resp: (!) 34 (!) 36 (!) 43   Temp:   98.7 F (37.1 C)   TempSrc:   Oral   SpO2: 94% 95% 95% 91%  Weight:      Height:        Intake/Output Summary (Last 24 hours) at 06/13/2020 1708 Last data filed at 06/13/2020 1600 Gross per 24 hour  Intake 3571.54 ml  Output 3850 ml  Net -278.46 ml   Filed Weights   06/11/20 2124  Weight: 90 kg    Examination:  General exam: Appears  agitated and restless. Respiratory system: Clear to auscultation. Respiratory effort normal. Cardiovascular system: S1 & S2 heard, RRR. No JVD, murmurs, rubs, gallops or clicks. No pedal edema. Gastrointestinal system: Abdomen is nondistended, soft and nontender. No organomegaly or masses felt. Normal bowel sounds heard. Central nervous system: Alert and oriented. No  focal neurological deficits. Extremities: Symmetric 5 x 5 power. Skin: No rashes, lesions or ulcers Psychiatry: Judgement and insight appear normal. Mood & affect appropriate.     Data Reviewed: I have personally reviewed following labs and imaging studies  CBC: Recent Labs  Lab 06/11/20 2132 06/12/20 0545 06/13/20 0717  WBC 23.5* 20.2* 12.8*  NEUTROABS 21.7*  --   --   HGB 15.0 14.4 12.2*  HCT 45.6 41.9 37.9*  MCV 91.9 89.9 95.0  PLT 359 277 238   Basic Metabolic Panel: Recent Labs  Lab 06/11/20 2132 06/12/20 0017 06/12/20 0545 06/13/20 0717  NA 138  --  137 142  K 2.8*  --  3.3* 3.8  CL 102  --  109 112*  CO2 19*  --  19* 19*  GLUCOSE 148*  --  115* 75  BUN 9  --  9 9  CREATININE 1.55*  --  1.12 1.14  CALCIUM 9.2  --  8.2* 8.4*  MG 2.2  --   --  2.2  PHOS  --  3.2  --  3.4   GFR: Estimated Creatinine Clearance: 99.5 mL/min (by C-G formula based on SCr of 1.14 mg/dL). Liver Function Tests: Recent Labs  Lab  06/11/20 2132 06/12/20 0545  AST 71* 61*  ALT 21 18  ALKPHOS 89 72  BILITOT 1.3* 2.2*  PROT 7.9 6.6  ALBUMIN 4.3 3.4*   Recent Labs  Lab 06/11/20 2132  LIPASE 629*   No results for input(s): AMMONIA in the last 168 hours. Coagulation Profile: No results for input(s): INR, PROTIME in the last 168 hours. Cardiac Enzymes: No results for input(s): CKTOTAL, CKMB, CKMBINDEX, TROPONINI in the last 168 hours. BNP (last 3 results) No results for input(s): PROBNP in the last 8760 hours. HbA1C: No results for input(s): HGBA1C in the last 72 hours. CBG: Recent Labs  Lab 06/13/20 0412 06/13/20 0731 06/13/20 1120 06/13/20 1205 06/13/20 1502  GLUCAP 73 73 57* 99 83   Lipid Profile: No results for input(s): CHOL, HDL, LDLCALC, TRIG, CHOLHDL, LDLDIRECT in the last 72 hours. Thyroid Function Tests: Recent Labs    06/13/20 0717  TSH 1.913   Anemia Panel: No results for input(s): VITAMINB12, FOLATE, FERRITIN, TIBC, IRON, RETICCTPCT in the last 72 hours. Sepsis Labs: Recent Labs  Lab 06/12/20 0017 06/12/20 0545  LATICACIDVEN 1.7 1.1    Recent Results (from the past 240 hour(s))  Resp Panel by RT-PCR (Flu A&B, Covid) Nasopharyngeal Swab     Status: None   Collection Time: 06/11/20  9:32 PM   Specimen: Nasopharyngeal Swab; Nasopharyngeal(NP) swabs in vial transport medium  Result Value Ref Range Status   SARS Coronavirus 2 by RT PCR NEGATIVE NEGATIVE Final    Comment: (NOTE) SARS-CoV-2 target nucleic acids are NOT DETECTED.  The SARS-CoV-2 RNA is generally detectable in upper respiratory specimens during the acute phase of infection. The lowest concentration of SARS-CoV-2 viral copies this assay can detect is 138 copies/mL. A negative result does not preclude SARS-Cov-2 infection and should not be used as the sole basis for treatment or other patient management decisions. A negative result may occur with  improper specimen collection/handling, submission of specimen  other than nasopharyngeal swab, presence of viral mutation(s) within the areas targeted by this assay, and inadequate number of viral copies(<138 copies/mL). A negative result must be combined with clinical observations, patient history, and epidemiological information. The expected result is Negative.  Fact Sheet for Patients:  BloggerCourse.com  Fact Sheet for Healthcare Providers:  SeriousBroker.it  This test is no t yet approved or cleared by the [place] and  has been authorized for detection and/or diagnosis of SARS-CoV-2 by FDA under an Emergency Use Authorization (EUA). This EUA will remain  in effect (meaning this test can be used) for the duration of the COVID-19 declaration under Section 564(b)(1) of the Act, 21 U.S.C.section 360bbb-3(b)(1), unless the authorization is terminated  or revoked sooner.       Influenza A by PCR NEGATIVE NEGATIVE Final   Influenza B by PCR NEGATIVE NEGATIVE Final    Comment: (NOTE) The Xpert Xpress SARS-CoV-2/FLU/RSV plus assay is intended as an aid in the diagnosis of influenza from Nasopharyngeal swab specimens and should not be used as a sole basis for treatment. Nasal washings and aspirates are unacceptable for Xpert Xpress SARS-CoV-2/FLU/RSV testing.  Fact Sheet for Patients: BloggerCourse.com  Fact Sheet for Healthcare Providers: SeriousBroker.it  This test is not yet approved or cleared by the [place] FDA and has been authorized for detection and/or diagnosis of SARS-CoV-2 by FDA under an Emergency Use Authorization (EUA). This EUA will remain in effect (meaning this test can be used) for the duration of the COVID-19 declaration under Section 564(b)(1) of the Act, 21 U.S.C. section 360bbb-3(b)(1), unless the authorization is terminated or revoked.  Performed at [facility], [address]  [place], [place] [address]   Blood culture (routine x 2)     Status: None (Preliminary result)   Collection Time: 06/12/20 12:17 AM   Specimen: BLOOD  Result Value Ref Range Status   Specimen Description BLOOD RIGHT FA  Final   Special Requests   Final    BOTTLES DRAWN AEROBIC AND ANAEROBIC Blood Culture results may not be optimal due to an inadequate volume of blood received in culture bottles   Culture   Final    NO GROWTH 1 DAY Performed at [facility], [address] [place], [place] [address]    Report Status PENDING  Incomplete  Blood culture (routine x 2)     Status: None (Preliminary result)   Collection Time: 06/12/20 12:45 AM   Specimen: BLOOD  Result Value Ref Range Status   Specimen Description BLOOD RIGHT HAND  Final   Special Requests   Final    BOTTLES DRAWN AEROBIC AND ANAEROBIC Blood Culture results may not be optimal due to an inadequate volume of blood received in culture bottles   Culture   Final    NO GROWTH 1 DAY Performed at [facility], [address] [place], [place] [address]    Report Status PENDING  Incomplete  MRSA PCR Screening     Status: None   Collection Time: 06/12/20  3:27 AM   Specimen: Nasopharyngeal  Result Value Ref Range Status   MRSA by PCR NEGATIVE NEGATIVE Final    Comment:        The GeneXpert MRSA Assay (FDA approved for NASAL specimens only), is one component of a comprehensive MRSA colonization surveillance program. It is not intended to diagnose MRSA infection nor to guide or monitor treatment for MRSA infections. Performed at [facility], [address] [place], [place] [address]          Radiology Studies: DG Abd 1 View  Result Date: 06/12/2020 CLINICAL DATA:  NG tube placement EXAM: ABDOMEN - 1 VIEW COMPARISON:  CT 05/22/2020 FINDINGS: Esophageal tube tip projects over the gastric outlet. Bowel gas pattern is unobstructed. IMPRESSION: Esophageal tube tip projects over  the gastric  outlet. Electronically Signed   By: Jasmine Pang M.D.   On: 06/12/2020 19:03   CT Head Wo Contrast  Result Date: 06/11/2020 CLINICAL DATA:  Seizure post alcohol withdrawal EXAM: CT CERVICAL SPINE WITHOUT CONTRAST TECHNIQUE: Multidetector CT imaging of the cervical spine was performed without intravenous contrast. Multiplanar CT image reconstructions were also generated. COMPARISON:  None. FINDINGS: Brain: No evidence of acute territorial infarction, hemorrhage, hydrocephalus,extra-axial collection or mass lesion/mass effect. Normal gray-white differentiation. Ventricles are normal in size and contour. Vascular: No hyperdense vessel or unexpected calcification. Skull: The skull is intact. No fracture or focal lesion identified. Sinuses/Orbits: The visualized paranasal sinuses and mastoid air cells are clear. The orbits and globes intact. Other: None Cervical spine: Alignment: Physiologic Skull base and vertebrae: Visualized skull base is intact. No atlanto-occipital dissociation. The vertebral body heights are well maintained. No fracture or pathologic osseous lesion seen. Soft tissues and spinal canal: The visualized paraspinal soft tissues are unremarkable. No prevertebral soft tissue swelling is seen. The spinal canal is grossly unremarkable, no large epidural collection or significant canal narrowing. Disc levels:  No significant canal or neural foraminal narrowing. Upper chest: The lung apices are clear. Thoracic inlet is within normal limits. Other: None IMPRESSION: No acute intracranial abnormality. No acute fracture or malalignment of the spine. Electronically Signed   By: Jonna Clark M.D.   On: 06/11/2020 23:41   CT Cervical Spine Wo Contrast  Result Date: 06/11/2020 CLINICAL DATA:  Seizure post alcohol withdrawal EXAM: CT CERVICAL SPINE WITHOUT CONTRAST TECHNIQUE: Multidetector CT imaging of the cervical spine was performed without intravenous contrast. Multiplanar CT image reconstructions were  also generated. COMPARISON:  None. FINDINGS: Brain: No evidence of acute territorial infarction, hemorrhage, hydrocephalus,extra-axial collection or mass lesion/mass effect. Normal gray-white differentiation. Ventricles are normal in size and contour. Vascular: No hyperdense vessel or unexpected calcification. Skull: The skull is intact. No fracture or focal lesion identified. Sinuses/Orbits: The visualized paranasal sinuses and mastoid air cells are clear. The orbits and globes intact. Other: None Cervical spine: Alignment: Physiologic Skull base and vertebrae: Visualized skull base is intact. No atlanto-occipital dissociation. The vertebral body heights are well maintained. No fracture or pathologic osseous lesion seen. Soft tissues and spinal canal: The visualized paraspinal soft tissues are unremarkable. No prevertebral soft tissue swelling is seen. The spinal canal is grossly unremarkable, no large epidural collection or significant canal narrowing. Disc levels:  No significant canal or neural foraminal narrowing. Upper chest: The lung apices are clear. Thoracic inlet is within normal limits. Other: None IMPRESSION: No acute intracranial abnormality. No acute fracture or malalignment of the spine. Electronically Signed   By: Jonna Clark M.D.   On: 06/11/2020 23:41   CT ABDOMEN PELVIS W CONTRAST  Result Date: 06/11/2020 CLINICAL DATA:  Abdominal distension. EXAM: CT ABDOMEN AND PELVIS WITH CONTRAST TECHNIQUE: Multidetector CT imaging of the abdomen and pelvis was performed using the standard protocol following bolus administration of intravenous contrast. CONTRAST:  OMNIPAQUE IOHEXOL 300 MG/ML  SOLN COMPARISON:  None. FINDINGS: Lower chest: There are trace bilateral pleural effusions.The heart size is mildly enlarged. Hepatobiliary: The liver is normal. Normal gallbladder.There is no biliary ductal dilation. Pancreas: There is peripancreatic fat stranding. The pancreas appears to be uniformly  enhancing. Spleen: Unremarkable. Adrenals/Urinary Tract: --Adrenal glands: Unremarkable. --Right kidney/ureter: No hydronephrosis or radiopaque kidney stones. --Left kidney/ureter: No hydronephrosis or radiopaque kidney stones. --Urinary bladder: The urinary bladder is distended. Stomach/Bowel: --Stomach/Duodenum: No hiatal hernia or other gastric abnormality. Normal  duodenal course and caliber. --Small bowel: Unremarkable. --Colon: Unremarkable. --Appendix: Normal. Vascular/Lymphatic: Normal course and caliber of the major abdominal vessels. --No retroperitoneal lymphadenopathy. --No mesenteric lymphadenopathy. --No pelvic or inguinal lymphadenopathy. Reproductive: Unremarkable Other: There is retroperitoneal free fluid. There is a small volume intraperitoneal free fluid. Musculoskeletal. No acute displaced fractures. IMPRESSION: 1. Findings consistent with acute interstitial pancreatitis. Correlation with lipase is recommended. 2. Small volume intraperitoneal and retroperitoneal free fluid. 3. Trace bilateral pleural effusions. 4. Distended urinary bladder. Electronically Signed   By: Katherine Mantle M.D.   On: 06/11/2020 23:41   CT Shoulder Left Wo Contrast  Result Date: 06/12/2020 CLINICAL DATA:  Shoulder trauma EXAM: CT OF THE UPPER LEFT EXTREMITY WITHOUT CONTRAST TECHNIQUE: Multidetector CT imaging of the upper left extremity was performed according to the standard protocol. COMPARISON:  Radiograph day FINDINGS: Bones/Joint/Cartilage There is been interval reduction of the anteroinferior dislocation. The humeral head now articulates with the glenoid. Again noted is comminuted impacted Hill-Sachs deformity of the posterolateral corner involving the anterior bicipital groove. The [facility] joint appears to be intact. Pain a small joint effusion is present. Ligaments Suboptimally assessed by CT. Muscles and Tendons Overlying deltoid musculature edema is seen. The remainder of the muscles appear to be intact. The  rotator cuff appears to be grossly intact. Soft tissues Overlying subcutaneous edema seen. IMPRESSION: 1. Interval reduction of the anterior dislocation with the glenohumeral joint in near anatomic location. 2. Comminuted slightly impacted Hill-Sachs deformity which involve the anterior bicipital groove. Electronically Signed   By: Jonna Clark M.D.   On: 06/12/2020 03:26   DG Chest Port 1 View  Result Date: 06/12/2020 CLINICAL DATA:  Seizure EXAM: PORTABLE CHEST 1 VIEW COMPARISON:  12/01/2018 FINDINGS: Lungs volumes are small, but are symmetric and are clear. No pneumothorax or pleural effusion. Cardiac size within normal limits. Pulmonary vascularity is normal. Osseous structures are age-appropriate. No acute bone abnormality. IMPRESSION: No active disease. Electronically Signed   By: Helyn Numbers MD   On: 06/12/2020 00:22   DG Shoulder Left  Result Date: 06/12/2020 CLINICAL DATA:  Pain EXAM: LEFT SHOULDER - 2+ VIEW COMPARISON:  None. FINDINGS: There is an anterior inferior glenohumeral fracture dislocation of the left humerus. There is a displaced fracture fragment measuring approximately 3.6 cm. IMPRESSION: Anterior inferior glenohumeral fracture dislocation of the left humerus. Electronically Signed   By: Katherine Mantle M.D.   On: 06/12/2020 00:02   ECHOCARDIOGRAM COMPLETE  Result Date: 06/13/2020    ECHOCARDIOGRAM REPORT   Patient Name:   Marcus Small Date of Exam: 06/12/2020 Medical Rec #:  [id number]          Height:       65.0 in Accession #:    [id number]         Weight:       198.4 lb Date of Birth:  24-Dec-1990           BSA:          1.971 m Patient Age:    29 years           BP:           158/85 mmHg Patient Gender: M                  HR:           140 bpm. Exam Location:  [facility] Procedure: 2D Echo, Cardiac Doppler and Color Doppler Indications:     Chest Pain 786.50 / R07.9  History:  Patient has no prior history of Echocardiogram examinations.                  Risk  Factors:Hypertension.  Sonographer:     Neysa Bonito Roar Referring Phys:  [id number] [place] Rollyson QQIWL Diagnosing Phys: Yvonne Kendall MD IMPRESSIONS  1. Left ventricular ejection fraction, by estimation, is 70 to 75%. The left ventricle has hyperdynamic function. Left ventricular endocardial border not optimally defined to evaluate regional wall motion. There is mild left ventricular hypertrophy. Indeterminate diastolic filling due to E-A fusion.  2. Right ventricular systolic function is normal. The right ventricular size is normal. Tricuspid regurgitation signal is inadequate for assessing PA pressure.  3. The mitral valve is grossly normal. No evidence of mitral valve regurgitation. No evidence of mitral stenosis.  4. The aortic valve has an indeterminant number of cusps. Aortic valve regurgitation is not visualized. No aortic stenosis is present. FINDINGS  Left Ventricle: Left ventricular ejection fraction, by estimation, is 70 to 75%. The left ventricle has hyperdynamic function. Left ventricular endocardial border not optimally defined to evaluate regional wall motion. The left ventricular internal cavity size was normal in size. There is mild left ventricular hypertrophy. Indeterminate diastolic filling due to E-A fusion. Right Ventricle: The right ventricular size is normal. No increase in right ventricular wall thickness. Right ventricular systolic function is normal. Tricuspid regurgitation signal is inadequate for assessing PA pressure. Left Atrium: Left atrial size was normal in size. Right Atrium: Right atrial size was normal in size. Pericardium: There is no evidence of pericardial effusion. Mitral Valve: The mitral valve is grossly normal. No evidence of mitral valve regurgitation. No evidence of mitral valve stenosis. Tricuspid Valve: The tricuspid valve is not well visualized. Tricuspid valve regurgitation is trivial. Aortic Valve: The aortic valve has an indeterminant number of cusps. Aortic valve  regurgitation is not visualized. No aortic stenosis is present. Aortic valve peak gradient measures 4.5 mmHg. Pulmonic Valve: The pulmonic valve was not well visualized. Pulmonic valve regurgitation is not visualized. No evidence of pulmonic stenosis. Aorta: The aortic root is normal in size and structure. Pulmonary Artery: The pulmonary artery is not well seen. Venous: The inferior vena cava was not well visualized. IAS/Shunts: The interatrial septum was not well visualized.  LEFT VENTRICLE PLAX 2D LVIDd:         4.40 cm  Diastology LVIDs:         3.40 cm  LV e' medial:    9.14 cm/s LV PW:         1.00 cm  LV E/e' medial:  5.6 LV IVS:        1.30 cm  LV e' lateral:   6.96 cm/s LVOT diam:     2.00 cm  LV E/e' lateral: 7.3 LVOT Area:     3.14 cm  RIGHT VENTRICLE RV Mid diam:    2.80 cm RV S prime:     19.60 cm/s TAPSE (M-mode): 2.4 cm LEFT ATRIUM             Index       RIGHT ATRIUM           Index LA diam:        3.50 cm 1.78 cm/m  RA Area:     11.50 cm LA Vol (A2C):   28.7 ml 14.56 ml/m RA Volume:   23.40 ml  11.87 ml/m LA Vol (A4C):   39.2 ml 19.88 ml/m LA Biplane Vol: 35.7 ml 18.11 ml/m  AORTIC VALVE  PULMONIC VALVE AV Area (Vmax): 2.41 cm    PV Vmax:        1.10 m/s AV Vmax:        106.00 cm/s PV Peak grad:   4.8 mmHg AV Peak Grad:   4.5 mmHg    RVOT Peak grad: 4 mmHg LVOT Vmax:      81.40 cm/s  AORTA Ao Root diam: 2.60 cm MITRAL VALVE MV Area (PHT): 5.75 cm    SHUNTS MV Decel Time: 132 msec    Systemic Diam: 2.00 cm MV E velocity: 51.00 cm/s MV A velocity: 82.30 cm/s MV E/A ratio:  0.62 MV A Prime:    12.1 cm/s Yvonne Kendall MD Electronically signed by Yvonne Kendall MD Signature Date/Time: 06/13/2020/7:11:24 AM    Final         Scheduled Meds: . Chlorhexidine Gluconate Cloth  6 each Topical Daily  . enoxaparin (LOVENOX) injection  0.5 mg/kg Subcutaneous Q24H  . folic acid  1 mg Oral Daily  . metoprolol tartrate  7.5 mg Intravenous Q6H  . multivitamin with minerals  1 tablet  Oral Daily  . propofol  1 mg/kg Intravenous Once  . sodium chloride flush  3 mL Intravenous Q12H  . thiamine  100 mg Oral Daily   Or  . thiamine  100 mg Intravenous Daily   Continuous Infusions: . dexmedetomidine (PRECEDEX) IV infusion 0.4 mcg/kg/hr (06/13/20 1629)  . dextrose 5 % and 0.9% NaCl 150 mL/hr at 06/13/20 1657  . lactated ringers 150 mL/hr at 06/13/20 1600     LOS: 1 day    Time spent: 35 mins    Caydyn Sprung, MD [facility]   If 7PM-7AM, please contact night-coverage

## 2020-06-13 NOTE — Consult Note (Signed)
NAME:  Marcus Small, MRN:  [id number], DOB:  April 06, 1991, LOS: 1 ADMISSION DATE:  06/11/2020, CONSULTATION DATE: 06/13/2020 REFERRING MD: Dr. Lucianne Muss, CHIEF COMPLAINT: Seizures   Brief History:  30 yo male admitted with left glenohumeral joint dislocation with greater tuberosity fracture pt s/p left shoulder reduction in ER; pancreatitis; and seizure activity secondary to severe ETOH withdrawal requiring precedex gtt   History of Present Illness:  This is a 30 yo male who presented to [facility] ER via EMS on 02/28 with seizure activity.  He is a heavy drinker and stopped drinking 2 days prior to ER presentation.  Per ER notes he had a witnessed tonic-clonic seizure that lasted a few minutes at home prior to arrival to the ER.  He does not have a hx of seizures. Upon EMS arrival at pts home he was postictal.  In the ER pt awake and c/o constant left shoulder pain as well as upper abdominal pain.  CT Abd Pelvis revealed acute interstitial pancreatitis; small volume intraperitoneal and retroperitoneal free fluid, and trace bilateral pleural effusions.  CT Head/Cervical Spine negative.  Left shoulder x-ray concerning for anterior inferior glenohumeral fracture dislocation. In the ER left shoulder reduction performed and left shoulder immobilizer placed.  Post left shoulder reduction CT of the left shoulder ordered which revealed interval reduction of the anterior dislocation with the glenohumeral joint in near anatomic location and comminuted slightly impacted Hill-Sachs deformity involving the anterior bicipital groove.  Pt also received 3L iv fluid bolus and ativan in the ER.  He was subsequently admitted to the progressive care unit per hospitalist team for additional workup and treatment.   Hospital course complicated by symptomatic pancreatitis resulting in frequent nausea/vomiting requiring NG tube placement for gastric decompression.  Pt also noted to have worsening ETOH withdrawal symptoms despite prn  ativan requiring precedex gtt.  Pt changed to ICU status and PCCM team consulted 03/1 to assist with management.  Cardiology also consulted due to slightly elevated troponins.    Past Medical History:  HTN Anxiety  ETOH Abuse  Depression  Suicidal Ideation   Significant Hospital Events:  02/28  Consults:  02/28: Pt initially admitted to the progressive care unit, however status changed to ICU for closer monitoring 03/1: Pt with worsening ETOH withdrawal symptoms requiring precedex gtt.  PCCM team consulted to assist with management  03/1: Orthopedic consulted for left glenohumeral joint dislocation with greater tuberosity fracture pt s/p left shoulder reduction in ER.  Per orthopedic surgery pt opting to proceed with non surgical management with the arm in a sling and non weightbearing of the left upper extremity   Procedures:  Left Shoulder Reduction   Significant Diagnostic Tests:  CT Abd Pelvis 02/27>>Findings consistent with acute interstitial pancreatitis. Correlation with lipase is recommended. Small volume intraperitoneal and retroperitoneal free fluid. Trace bilateral pleural effusions. Distended urinary bladder. CT Head Cervical Spine 02/27>>No acute intracranial abnormality. No acute fracture or malalignment of the spine. CT Shoulder Left 02/28>>Interval reduction of the anterior dislocation with the glenohumeral joint in near anatomic location. Comminuted slightly impacted Hill-Sachs deformity which involve the anterior bicipital groove.  Micro Data:  Respiratory Panel by RT-PCR 02/27>>negative  Blood x2 02/28>>negative   Antimicrobials:  None   Interim History / Subjective:  Pt lethargic resting comfortably in bed with minimal agitation   Objective   Blood pressure 132/74, pulse (!) 119, temperature 98.7 F (37.1 C), temperature source Oral, resp. rate (!) 24, height 5\' 5"  (1.651 m), weight 90 kg, SpO2  100 %.        Intake/Output Summary (Last 24 hours) at  06/13/2020 1924 Last data filed at 06/13/2020 1800 Gross per 24 hour  Intake 4399.46 ml  Output 3400 ml  Net 999.46 ml   Filed Weights   06/11/20 2124  Weight: 90 kg    Examination: General: acutely ill appearing male, NAD resting in bed  HENT: supple, no JVD  Lungs: diminished throughout, even, non labored  Cardiovascular: sinus tach, no R/G, 2+ radial and 2+ distal pulses, no edema  Abdomen: +BS x4, taut, slightly distended, non tender  Extremities: normal bulk, no edema; left shoulder immobilizer in place Neuro: alert to self and place only, able to follow commands, PERRL   Assessment & Plan:   Mildly elevated troponin's likely secondary to demand ischemia in the setting of acute pancreatitis: 93~214; Echo with hyperdynamic LVEF Hx: HTN  Continuous telemetry monitoring  Cardiology consulted appreciate input-recommending no further workup at this time; defer iv heparin in the setting of acute pancreatitis, and titrate up metoprolol to 7.5 mg iv q6hrs for heart rate and bp control  Will d/c prn labetalol   Acute pancreatitis secondary to ETOH abuse  Nausea/vomiting  Keep NPO for now  Continue NG tube to LIS  Trend amylase and lipase  Prn dilaudid for pain management  Continue maintenance fluids   Anemia without obvious acute blood loss Trend CBC  Monitor for s/sx of bleeding and transfuse for hgb <7  Left glenohumeral joint dislocation with greater tuberosity fracture pt s/p left shoulder reduction in ER Orthopedic surgery consulted appreciate input-after discussing options with pt: pt opting to proceed with non surgical management with the arm in a sling and non weightbearing of the left upper extremity   Acute encephalopathy secondary to severe ETOH withdrawal  Seizure activity secondary to ETOH withdrwal  Hx: Anxiety, depression, and suicide ideation  Continue CIWA protocol  Prn ativan for seizure activity  Continue precedex gtt for now  Frequent reorientation   Continue tele sitter  Psychiatry consulted appreciate input   Best practice (evaluated daily)  Diet: NPO Pain/Anxiety/Delirium protocol (if indicated): prn dilaudid, ativan, and precedex gtt  VAP protocol (if indicated): not indicated  DVT prophylaxis: subq lovenox  GI prophylaxis: iv protonix  Glucose control: not indicated  Mobility: activity as tolerated   Disposition: ICU   Goals of Care:  Last date of multidisciplinary goals of care discussion: N/A Family and staff present: N/A Summary of discussion: N/A Follow up goals of care discussion due: 06/13/2020 Code Status: Full code   Labs   CBC: Recent Labs  Lab 06/11/20 2132 06/12/20 0545 06/13/20 0717  WBC 23.5* 20.2* 12.8*  NEUTROABS 21.7*  --   --   HGB 15.0 14.4 12.2*  HCT 45.6 41.9 37.9*  MCV 91.9 89.9 95.0  PLT 359 277 238    Basic Metabolic Panel: Recent Labs  Lab 06/11/20 2132 06/12/20 0017 06/12/20 0545 06/13/20 0717  NA 138  --  137 142  K 2.8*  --  3.3* 3.8  CL 102  --  109 112*  CO2 19*  --  19* 19*  GLUCOSE 148*  --  115* 75  BUN 9  --  9 9  CREATININE 1.55*  --  1.12 1.14  CALCIUM 9.2  --  8.2* 8.4*  MG 2.2  --   --  2.2  PHOS  --  3.2  --  3.4   GFR: Estimated Creatinine Clearance: 99.5 mL/min (by C-G formula  based on SCr of 1.14 mg/dL). Recent Labs  Lab 06/11/20 2132 06/12/20 0017 06/12/20 0545 06/13/20 0717  WBC 23.5*  --  20.2* 12.8*  LATICACIDVEN  --  1.7 1.1  --     Liver Function Tests: Recent Labs  Lab 06/11/20 2132 06/12/20 0545  AST 71* 61*  ALT 21 18  ALKPHOS 89 72  BILITOT 1.3* 2.2*  PROT 7.9 6.6  ALBUMIN 4.3 3.4*   Recent Labs  Lab 06/11/20 2132 06/13/20 0717  LIPASE 629* 108*   No results for input(s): AMMONIA in the last 168 hours.  ABG No results found for: PHART, PCO2ART, PO2ART, HCO3, TCO2, ACIDBASEDEF, O2SAT   Coagulation Profile: No results for input(s): INR, PROTIME in the last 168 hours.  Cardiac Enzymes: No results for input(s): CKTOTAL,  CKMB, CKMBINDEX, TROPONINI in the last 168 hours.  HbA1C: No results found for: HGBA1C  CBG: Recent Labs  Lab 06/13/20 0412 06/13/20 0731 06/13/20 1120 06/13/20 1205 06/13/20 1502  GLUCAP 73 73 57* 99 83    Review of Systems:   Unable to assess pt lethargic on precedex gtt   Past Medical History:  He,  has a past medical history of Anxiety and Hypertension.   Surgical History:  History reviewed. No pertinent surgical history.   Social History:   reports that he has been smoking cigarettes. He has been smoking about 0.50 packs per day. He has never used smokeless tobacco. He reports current alcohol use of about 18.0 standard drinks of alcohol per week.   Family History:  His family history includes Hypertension in his mother.   Allergies Allergies  Allergen Reactions  . Peanut-Containing Drug Products Itching  . Shellfish Allergy      Home Medications  Prior to Admission medications   Medication Sig Start Date End Date Taking? Authorizing Provider  Aspirin-Acetaminophen-Caffeine (GOODY HEADACHE PO) Take 1 packet by mouth as needed.   Yes [provider]  ibuprofen (ADVIL) 200 MG tablet Take 200 mg by mouth every 6 (six) hours as needed for headache.   Yes [provider]  albuterol (VENTOLIN HFA) 108 (90 Base) MCG/ACT inhaler Inhale 2 puffs into the lungs every 6 (six) hours as needed for wheezing or shortness of breath. Patient not taking: No sig reported 12/02/18   Don Perking, [place], MD  ketorolac (TORADOL) 10 MG tablet Take 1 tablet (10 mg total) by mouth every 8 (eight) hours as needed. Patient not taking: No sig reported 08/10/15   Darci Current, MD  meloxicam (MOBIC) 15 MG tablet Take 1 tablet (15 mg total) by mouth daily. Patient not taking: No sig reported 09/05/19   Cuthriell, Christiane Ha D, PA-C  ondansetron (ZOFRAN ODT) 4 MG disintegrating tablet Take 1 tablet (4 mg total) by mouth every 8 (eight) hours as needed. Patient not taking: No sig  reported 12/02/18   Nita Sickle, MD     Critical care time: 45 minutes      Sonda Rumble, [facility]  Pulmonary/Critical Care Pager [phone] (please enter 7 digits) PCCM Consult Pager [phone] (please enter 7 digits)

## 2020-06-14 ENCOUNTER — Inpatient Hospital Stay: Payer: Self-pay

## 2020-06-14 DIAGNOSIS — F10231 Alcohol dependence with withdrawal delirium: Secondary | ICD-10-CM

## 2020-06-14 DIAGNOSIS — J9601 Acute respiratory failure with hypoxia: Secondary | ICD-10-CM

## 2020-06-14 DIAGNOSIS — S43005S Unspecified dislocation of left shoulder joint, sequela: Secondary | ICD-10-CM

## 2020-06-14 DIAGNOSIS — F10931 Alcohol use, unspecified with withdrawal delirium: Secondary | ICD-10-CM

## 2020-06-14 LAB — COMPREHENSIVE METABOLIC PANEL
ALT: 27 U/L (ref 0–44)
AST: 76 U/L — ABNORMAL HIGH (ref 15–41)
Albumin: 2.5 g/dL — ABNORMAL LOW (ref 3.5–5.0)
Alkaline Phosphatase: 75 U/L (ref 38–126)
Anion gap: 6 (ref 5–15)
BUN: 12 mg/dL (ref 6–20)
CO2: 22 mmol/L (ref 22–32)
Calcium: 8.4 mg/dL — ABNORMAL LOW (ref 8.9–10.3)
Chloride: 117 mmol/L — ABNORMAL HIGH (ref 98–111)
Creatinine, Ser: 1.51 mg/dL — ABNORMAL HIGH (ref 0.61–1.24)
GFR, Estimated: >60 mL/min (ref >60)
Glucose, Bld: 109 mg/dL — ABNORMAL HIGH (ref 70–99)
Potassium: 3.7 mmol/L (ref 3.5–5.1)
Sodium: 145 mmol/L (ref 135–145)
Total Bilirubin: 2.2 mg/dL — ABNORMAL HIGH (ref 0.3–1.2)
Total Protein: 6 g/dL — ABNORMAL LOW (ref 6.5–8.1)

## 2020-06-14 LAB — CBC
HCT: 35.7 % — ABNORMAL LOW (ref 39.0–52.0)
Hemoglobin: 11.2 g/dL — ABNORMAL LOW (ref 13.0–17.0)
MCH: 30.1 pg (ref 26.0–34.0)
MCHC: 31.4 g/dL (ref 30.0–36.0)
MCV: 96 fL (ref 80.0–100.0)
Platelets: 222 10*3/uL (ref 150–400)
RBC: 3.72 MIL/uL — ABNORMAL LOW (ref 4.22–5.81)
RDW: 12.8 % (ref 11.5–15.5)
WBC: 9.9 10*3/uL (ref 4.0–10.5)
nRBC: 0.2 % (ref 0.0–0.2)

## 2020-06-14 LAB — BLOOD GAS, ARTERIAL
Acid-base deficit: 2 mmol/L (ref 0.0–2.0)
Bicarbonate: 23.7 mmol/L (ref 20.0–28.0)
FIO2: 0.4
MECHVT: 500 mL
O2 Saturation: 97.3 %
PEEP: 5 cmH2O
Patient temperature: 37
RATE: 15 resp/min
pCO2 arterial: 43 mmHg (ref 32.0–48.0)
pH, Arterial: 7.35 (ref 7.350–7.450)
pO2, Arterial: 98 mmHg (ref 83.0–108.0)

## 2020-06-14 LAB — LIPASE, BLOOD: Lipase: 64 U/L — ABNORMAL HIGH (ref 11–51)

## 2020-06-14 LAB — RESPIRATORY PANEL BY PCR

## 2020-06-14 LAB — GLUCOSE, CAPILLARY
Glucose-Capillary: 90 mg/dL (ref 70–99)
Glucose-Capillary: 104 mg/dL — ABNORMAL HIGH (ref 70–99)
Glucose-Capillary: 90 mg/dL (ref 70–99)
Glucose-Capillary: 107 mg/dL — ABNORMAL HIGH (ref 70–99)
Glucose-Capillary: 99 mg/dL (ref 70–99)

## 2020-06-14 LAB — STREP PNEUMONIAE URINARY ANTIGEN: Strep Pneumo Urinary Antigen: NEGATIVE

## 2020-06-14 LAB — PROCALCITONIN: Procalcitonin: 0.63 ng/mL

## 2020-06-14 LAB — MAGNESIUM: Magnesium: 2.4 mg/dL (ref 1.7–2.4)

## 2020-06-14 LAB — PHOSPHORUS: Phosphorus: 2.6 mg/dL (ref 2.5–4.6)

## 2020-06-14 LAB — AMYLASE: Amylase: 217 U/L — ABNORMAL HIGH (ref 28–100)

## 2020-06-14 MED ORDER — SODIUM CHLORIDE 0.9% FLUSH
10.0000 mL | INTRAVENOUS | Status: DC | PRN
  Administered 2020-06-15 – 2020-06-22 (×3): 10 mL

## 2020-06-14 MED ORDER — ZIPRASIDONE MESYLATE 20 MG IM SOLR: 20.0000 mg | Freq: Once | INTRAMUSCULAR | Status: DC

## 2020-06-14 MED ORDER — FOLIC ACID 5 MG/ML IJ SOLN
1.0000 mg | Freq: Every day | INTRAMUSCULAR | Status: DC
  Administered 2020-06-14 – 2020-06-23 (×9): 1 mg via INTRAVENOUS
  Filled 2020-06-14 (×12): qty 0.2

## 2020-06-14 MED ORDER — MIDAZOLAM 50MG/50ML (1MG/ML) PREMIX INFUSION
0.0000 mg/h | INTRAVENOUS | Status: DC
  Administered 2020-06-14: 7 mg/h via INTRAVENOUS
  Administered 2020-06-14: 2 mg/h via INTRAVENOUS
  Administered 2020-06-15: 7 mg/h via INTRAVENOUS
  Administered 2020-06-15 (×2): 5 mg/h via INTRAVENOUS
  Administered 2020-06-16 (×2): 8 mg/h via INTRAVENOUS
  Administered 2020-06-16: 6 mg/h via INTRAVENOUS
  Administered 2020-06-17: 8 mg/h via INTRAVENOUS
  Administered 2020-06-17: 6 mg/h via INTRAVENOUS
  Administered 2020-06-17: 8 mg/h via INTRAVENOUS
  Administered 2020-06-18 (×3): 6 mg/h via INTRAVENOUS
  Administered 2020-06-19: 10 mg/h via INTRAVENOUS
  Administered 2020-06-19: 7 mg/h via INTRAVENOUS
  Administered 2020-06-19: 6 mg/h via INTRAVENOUS
  Administered 2020-06-19: 10 mg/h via INTRAVENOUS
  Administered 2020-06-20: 8 mg/h via INTRAVENOUS
  Administered 2020-06-20: 10 mg/h via INTRAVENOUS
  Administered 2020-06-20: 6 mg/h via INTRAVENOUS
  Administered 2020-06-21: 7 mg/h via INTRAVENOUS
  Filled 2020-06-14 (×22): qty 50

## 2020-06-14 MED ORDER — FENTANYL CITRATE (PF) 100 MCG/2ML IJ SOLN: 100.0000 ug | Freq: Once | INTRAMUSCULAR | Status: AC

## 2020-06-14 MED ORDER — FENTANYL 2500MCG IN NS 250ML (10MCG/ML) PREMIX INFUSION
50.0000 ug/h | INTRAVENOUS | Status: DC
  Administered 2020-06-15 – 2020-06-17 (×5): 200 ug/h via INTRAVENOUS
  Administered 2020-06-17 – 2020-06-18 (×2): 150 ug/h via INTRAVENOUS
  Administered 2020-06-19: 200 ug/h via INTRAVENOUS
  Filled 2020-06-14 (×8): qty 250

## 2020-06-14 MED ORDER — FENTANYL BOLUS VIA INFUSION
50.0000 ug | INTRAVENOUS | Status: DC | PRN
  Administered 2020-06-14 – 2020-06-19 (×7): 50 ug via INTRAVENOUS
  Filled 2020-06-14: qty 50

## 2020-06-14 MED ORDER — MIDAZOLAM HCL 2 MG/2ML IJ SOLN
INTRAMUSCULAR | Status: AC
  Administered 2020-06-14: 2 mg via INTRAVENOUS
  Filled 2020-06-14: qty 2

## 2020-06-14 MED ORDER — MIDAZOLAM BOLUS VIA INFUSION
1.0000 mg | INTRAVENOUS | Status: DC | PRN
  Administered 2020-06-14 – 2020-06-15 (×5): 2 mg via INTRAVENOUS
  Administered 2020-06-17: 1 mg via INTRAVENOUS
  Administered 2020-06-18 – 2020-06-20 (×8): 2 mg via INTRAVENOUS
  Filled 2020-06-14: qty 2

## 2020-06-14 MED ORDER — FENTANYL CITRATE (PF) 100 MCG/2ML IJ SOLN
INTRAMUSCULAR | Status: AC
  Administered 2020-06-14: 100 ug via INTRAVENOUS
  Filled 2020-06-14: qty 2

## 2020-06-14 MED ORDER — POTASSIUM CHLORIDE 20 MEQ PO PACK: 40.0000 meq | PACK | Freq: Once | ORAL | Status: DC

## 2020-06-14 MED ORDER — MIDAZOLAM HCL 2 MG/2ML IJ SOLN: 2.0000 mg | Freq: Once | INTRAMUSCULAR | Status: AC

## 2020-06-14 MED ORDER — POLYETHYLENE GLYCOL 3350 17 G PO PACK
17.0000 g | PACK | Freq: Every day | ORAL | Status: DC
  Administered 2020-06-16 – 2020-06-20 (×4): 17 g
  Filled 2020-06-14 (×4): qty 1

## 2020-06-14 MED ORDER — PIPERACILLIN-TAZOBACTAM 3.375 G IVPB
3.3750 g | Freq: Three times a day (TID) | INTRAVENOUS | Status: DC
  Administered 2020-06-14 – 2020-06-21 (×21): 3.375 g via INTRAVENOUS
  Filled 2020-06-14 (×21): qty 50

## 2020-06-14 MED ORDER — FENTANYL 2500MCG IN NS 250ML (10MCG/ML) PREMIX INFUSION
INTRAVENOUS | Status: AC
  Administered 2020-06-14: 50 ug/h via INTRAVENOUS
  Filled 2020-06-14: qty 250

## 2020-06-14 MED ORDER — ETOMIDATE 2 MG/ML IV SOLN
INTRAVENOUS | Status: AC
  Administered 2020-06-14: 20 mg via INTRAVENOUS
  Filled 2020-06-14: qty 10

## 2020-06-14 MED ORDER — FENTANYL CITRATE (PF) 100 MCG/2ML IJ SOLN
INTRAMUSCULAR | Status: AC
  Administered 2020-06-14: 50 ug via INTRAVENOUS
  Filled 2020-06-14: qty 2

## 2020-06-14 MED ORDER — SODIUM CHLORIDE 0.9% FLUSH
10.0000 mL | Freq: Two times a day (BID) | INTRAVENOUS | Status: DC
  Administered 2020-06-14: 10 mL
  Administered 2020-06-14: 30 mL
  Administered 2020-06-15 – 2020-06-18 (×8): 10 mL
  Administered 2020-06-18: 20 mL
  Administered 2020-06-19 – 2020-06-25 (×13): 10 mL
  Administered 2020-06-26: 30 mL

## 2020-06-14 MED ORDER — ROCURONIUM BROMIDE 50 MG/5ML IV SOLN: 50.0000 mg | Freq: Once | INTRAVENOUS | Status: AC

## 2020-06-14 MED ORDER — ROCURONIUM BROMIDE 50 MG/5ML IV SOLN
INTRAVENOUS | Status: AC
  Administered 2020-06-14: 50 mg via INTRAVENOUS
  Filled 2020-06-14: qty 1

## 2020-06-14 MED ORDER — MORPHINE SULFATE (PF) 2 MG/ML IV SOLN
2.0000 mg | Freq: Once | INTRAVENOUS | Status: AC
  Administered 2020-06-14: 2 mg via INTRAVENOUS
  Filled 2020-06-14: qty 1

## 2020-06-14 MED ORDER — DOCUSATE SODIUM 50 MG/5ML PO LIQD
100.0000 mg | Freq: Two times a day (BID) | ORAL | Status: DC
  Administered 2020-06-15 – 2020-06-22 (×12): 100 mg
  Filled 2020-06-14 (×13): qty 10

## 2020-06-14 MED ORDER — FENTANYL CITRATE (PF) 100 MCG/2ML IJ SOLN: 50.0000 ug | Freq: Once | INTRAMUSCULAR | Status: DC

## 2020-06-14 MED ORDER — POTASSIUM CHLORIDE 10 MEQ/100ML IV SOLN
10.0000 meq | INTRAVENOUS | Status: AC
Start: 2020-06-14 — End: 2020-06-14
  Administered 2020-06-14 (×4): 10 meq via INTRAVENOUS
  Filled 2020-06-14 (×4): qty 100

## 2020-06-14 MED ORDER — ETOMIDATE 2 MG/ML IV SOLN: 20.0000 mg | Freq: Once | INTRAVENOUS | Status: AC

## 2020-06-14 MED ORDER — FENTANYL CITRATE (PF) 100 MCG/2ML IJ SOLN: 50.0000 ug | Freq: Once | INTRAMUSCULAR | Status: AC

## 2020-06-14 NOTE — Progress Notes (Signed)
Peripherally Inserted Central Catheter Placement  The IV Nurse has discussed with the patient and/or persons authorized to consent for the patient, the purpose of this procedure and the potential benefits and risks involved with this procedure.  The benefits include less needle sticks, lab draws from the catheter, and the patient may be discharged home with the catheter. Risks include, but not limited to, infection, bleeding, blood clot (thrombus formation), and puncture of an artery; nerve damage and irregular heartbeat and possibility to perform a PICC exchange if needed/ordered by physician.  Alternatives to this procedure were also discussed.  Bard Power PICC patient education guide, fact sheet on infection prevention and patient information card has been provided to patient /or left at bedside.   Consent obtained via telephone with mother  PICC Placement Documentation  PICC Triple Lumen 06/14/20 PICC Right Basilic 40 cm 0 cm (Active)  Indication for Insertion or Continuance of Line Prolonged intravenous therapies 06/14/20 1200  Exposed Catheter (cm) 0 cm 06/14/20 1200  Site Assessment Clean;Dry;Intact 06/14/20 1200  Lumen #1 Status Flushed;Saline locked;Blood return noted 06/14/20 1200  Lumen #2 Status Flushed;Saline locked;Blood return noted 06/14/20 1200  Lumen #3 Status Flushed;Saline locked;Blood return noted 06/14/20 1200  Dressing Type Transparent;Securing device 06/14/20 1200  Dressing Status Clean;Dry;Intact 06/14/20 1200  Antimicrobial disc in place? Yes 06/14/20 1200  Safety Lock Not Applicable 06/14/20 1200  Line Care Connections checked and tightened 06/14/20 1200  Line Adjustment (NICU/IV Team Only) No 06/14/20 1200  Dressing Intervention New dressing 06/14/20 1200  Dressing Change Due 06/21/20 06/14/20 1200       Franne Grip Renee 06/14/2020, 12:09 PM

## 2020-06-14 NOTE — Progress Notes (Signed)
GOALS OF CARE DISCUSSION  The Clinical status was relayed to family in detail. Mother over the phone  Updated and notified of patients medical condition.  Patient remains unresponsive and will not open eyes to command.   Patient with increased WOB     Patient with severe pancreatitis and ETOH withdrawal   Family understands the situation.  Patient is FULL CODE  Family are satisfied with Plan of action and management. All questions answered  Additional CC time 32 mins   Kurian Santiago Glad, M.D.  [facility]  Medical Director [facility] Medical Director [facility] Cardio-Pulmonary Department

## 2020-06-14 NOTE — Progress Notes (Signed)
PROGRESS NOTE    Marcus Small  MWN:[id number] DOB: 05/22/1990 DOA: 06/11/2020 PCP: Patient, No Pcp Per    Brief Narrative:  This 30 years oldmale with PMH significant for alcohol use, anxiety, hypertension presents with seizures at home. Family reports that patient had a tonic-clonic seizure that lasted for about 1 minute. Family also reported history of heavy alcohol use.Patient quit drinking for past 2 to 3 days due to ongoing abdominal pain, in thattime he had been sleeping most of the day, He also been complaining about left shoulder painlately. He was found to be tachycardic,tachypneic and hypertensive in the ED. Labs include potassium 2.8,bicarb 19,BUN 9,creatinine 1.55, Lipase elevated at 629,initial troponin 93 trending up to 236.Urine drug screen negative. CT head without acute abnormality. CT abdomen showed pancreatitis,small volume free fluid and distended bladder. X-ray left shoulder showed dislocation. Patient was given a dose of broad-spectrum antibiotics,IV fluids and Ativan in the ED. Patient is admitted for alcohol withdrawal seizures,alcohol level negative. Patient will be continued with CIWA protocol with Ativan, Heis also found to have pancreatitis,will be kept NPOwith IV fluids,there is no infectious etiology noted to explain leukocytosis. He is continuedonIV fluids for acute kidney injury.Cardiologywas consulted for slightly elevated troponin which could be in the setting of seizures,suspect demand ischemia, 2D echocardiogram hyperdynamic EF. Patient was agitated and restless requiring Precedex drip. Psych consulted for history of significant depression and recent thoughts of suicide at home.  3/2-this am pt doesn't reposnd with sternal rub. ngt in place. Later pt is s/p intubation today by pccpm  Consultants:   Pccm, cardiology, psych.  Procedures:   Antimicrobials:       Subjective: Eyes closed, no response to sternal rub,  snoring  Objective: Vitals:   06/14/20 1340 06/14/20 1351 06/14/20 1357 06/14/20 1425  BP:    (!) 152/126  Pulse: 99   (!) 107  Resp: 20   19  Temp:      TempSrc:      SpO2: 100% 99%  99%  Weight:      Height:   5\' 5"  (1.651 m)     Intake/Output Summary (Last 24 hours) at 06/14/2020 1527 Last data filed at 06/14/2020 0800 Gross per 24 hour  Intake 3902.24 ml  Output 1200 ml  Net 2702.24 ml   Filed Weights   06/11/20 2124  Weight: 90 kg    Examination:  General exam: eyes closed this am. Somnolent, no response to sternal rub Respiratory system: decrease bs anteriorly NGT in place Cardiovascular system: S1 & S2 heard, RRR. No JVD, murmurs, rubs, gallops or clicks.  Gastrointestinal system: Abdomen is nondistended, soft and nontender.Normal bowel sounds heard. Central nervous system: unable to evaluate Extremities: no edema Psychiatry: unable to evaluate    Data Reviewed: I have personally reviewed following labs and imaging studies  CBC: Recent Labs  Lab 06/11/20 2132 06/12/20 0545 06/13/20 0717 06/14/20 0719  WBC 23.5* 20.2* 12.8* 9.9  NEUTROABS 21.7*  --   --   --   HGB 15.0 14.4 12.2* 11.2*  HCT 45.6 41.9 37.9* 35.7*  MCV 91.9 89.9 95.0 96.0  PLT 359 277 238 222   Basic Metabolic Panel: Recent Labs  Lab 06/11/20 2132 06/12/20 0017 06/12/20 0545 06/13/20 0717 06/14/20 0719  NA 138  --  137 142 145  K 2.8*  --  3.3* 3.8 3.7  CL 102  --  109 112* 117*  CO2 19*  --  19* 19* 22  GLUCOSE 148*  --  115* 75 109*  BUN 9  --  9 9 12   CREATININE 1.55*  --  1.12 1.14 1.51*  CALCIUM 9.2  --  8.2* 8.4* 8.4*  MG 2.2  --   --  2.2 2.4  PHOS  --  3.2  --  3.4 2.6   GFR: Estimated Creatinine Clearance: 75.1 mL/min (A) (by C-G formula based on SCr of 1.51 mg/dL (H)). Liver Function Tests: Recent Labs  Lab 06/11/20 2132 06/12/20 0545 06/14/20 0719  AST 71* 61* 76*  ALT 21 18 27   ALKPHOS 89 72 75  BILITOT 1.3* 2.2* 2.2*  PROT 7.9 6.6 6.0*  ALBUMIN 4.3  3.4* 2.5*   Recent Labs  Lab 06/11/20 2132 06/13/20 0717 06/14/20 0719  LIPASE 629* 108* 64*  AMYLASE  --   --  217*   No results for input(s): AMMONIA in the last 168 hours. Coagulation Profile: No results for input(s): INR, PROTIME in the last 168 hours. Cardiac Enzymes: No results for input(s): CKTOTAL, CKMB, CKMBINDEX, TROPONINI in the last 168 hours. BNP (last 3 results) No results for input(s): PROBNP in the last 8760 hours. HbA1C: No results for input(s): HGBA1C in the last 72 hours. CBG: Recent Labs  Lab 06/13/20 1933 06/13/20 2335 06/14/20 0412 06/14/20 0721 06/14/20 1241  GLUCAP 93 108* 90 104* 90   Lipid Profile: No results for input(s): CHOL, HDL, LDLCALC, TRIG, CHOLHDL, LDLDIRECT in the last 72 hours. Thyroid Function Tests: Recent Labs    06/13/20 0717  TSH 1.913   Anemia Panel: No results for input(s): VITAMINB12, FOLATE, FERRITIN, TIBC, IRON, RETICCTPCT in the last 72 hours. Sepsis Labs: Recent Labs  Lab 06/12/20 0017 06/12/20 0545 06/14/20 1441  PROCALCITON  --   --  0.63  LATICACIDVEN 1.7 1.1  --     Recent Results (from the past 240 hour(s))  Resp Panel by RT-PCR (Flu A&B, Covid) Nasopharyngeal Swab     Status: None   Collection Time: 06/11/20  9:32 PM   Specimen: Nasopharyngeal Swab; Nasopharyngeal(NP) swabs in vial transport medium  Result Value Ref Range Status   SARS Coronavirus 2 by RT PCR NEGATIVE NEGATIVE Final    Comment: (NOTE) SARS-CoV-2 target nucleic acids are NOT DETECTED.  The SARS-CoV-2 RNA is generally detectable in upper respiratory specimens during the acute phase of infection. The lowest concentration of SARS-CoV-2 viral copies this assay can detect is 138 copies/mL. A negative result does not preclude SARS-Cov-2 infection and should not be used as the sole basis for treatment or other patient management decisions. A negative result may occur with  improper specimen collection/handling, submission of specimen  other than nasopharyngeal swab, presence of viral mutation(s) within the areas targeted by this assay, and inadequate number of viral copies(<138 copies/mL). A negative result must be combined with clinical observations, patient history, and epidemiological information. The expected result is Negative.  Fact Sheet for Patients:  08/14/20  Fact Sheet for Healthcare Providers:  06/13/20  This test is no t yet approved or cleared by the [employer] FDA and  has been authorized for detection and/or diagnosis of SARS-CoV-2 by FDA under an Emergency Use Authorization (EUA). This EUA will remain  in effect (meaning this test can be used) for the duration of the COVID-19 declaration under Section 564(b)(1) of the Act, 21 U.S.C.section 360bbb-3(b)(1), unless the authorization is terminated  or revoked sooner.       Influenza A by PCR NEGATIVE NEGATIVE Final   Influenza B by PCR NEGATIVE NEGATIVE Final  Comment: (NOTE) The Xpert Xpress SARS-CoV-2/FLU/RSV plus assay is intended as an aid in the diagnosis of influenza from Nasopharyngeal swab specimens and should not be used as a sole basis for treatment. Nasal washings and aspirates are unacceptable for Xpert Xpress SARS-CoV-2/FLU/RSV testing.  Fact Sheet for Patients: [employer]  Fact Sheet for Healthcare Providers: [employer]it  This test is not yet approved or cleared by the [place] FDA and has been authorized for detection and/or diagnosis of SARS-CoV-2 by FDA under an Emergency Use Authorization (EUA). This EUA will remain in effect (meaning this test can be used) for the duration of the COVID-19 declaration under Section 564(b)(1) of the Act, 21 U.S.C. section 360bbb-3(b)(1), unless the authorization is terminated or revoked.  Performed at [facility], [address]  [place], [place] [address]   Blood culture (routine x 2)     Status: None (Preliminary result)   Collection Time: 06/12/20 12:17 AM   Specimen: BLOOD  Result Value Ref Range Status   Specimen Description BLOOD RIGHT FA  Final   Special Requests   Final    BOTTLES DRAWN AEROBIC AND ANAEROBIC Blood Culture results may not be optimal due to an inadequate volume of blood received in culture bottles   Culture   Final    NO GROWTH 2 DAYS Performed at [facility], [address] [place], [place] [address]    Report Status PENDING  Incomplete  Blood culture (routine x 2)     Status: None (Preliminary result)   Collection Time: 06/12/20 12:45 AM   Specimen: BLOOD  Result Value Ref Range Status   Specimen Description BLOOD RIGHT HAND  Final   Special Requests   Final    BOTTLES DRAWN AEROBIC AND ANAEROBIC Blood Culture results may not be optimal due to an inadequate volume of blood received in culture bottles   Culture   Final    NO GROWTH 2 DAYS Performed at [facility], [address], [place], [place] [address]    Report Status PENDING  Incomplete  MRSA PCR Screening     Status: None   Collection Time: 06/12/20  3:27 AM   Specimen: Nasopharyngeal  Result Value Ref Range Status   MRSA by PCR NEGATIVE NEGATIVE Final    Comment:        The GeneXpert MRSA Assay (FDA approved for NASAL specimens only), is one component of a comprehensive MRSA colonization surveillance program. It is not intended to diagnose MRSA infection nor to guide or monitor treatment for MRSA infections. Performed at [facility], [address], [place], [place] [address]          Radiology Studies: DG Chest 1 View  Result Date: 06/14/2020 CLINICAL DATA:  Endotracheal tube placement. EXAM: CHEST  1 VIEW COMPARISON:  June 11, 2020. FINDINGS: Stable cardiomegaly. Endotracheal and nasogastric tubes are unchanged in position. No pneumothorax is noted. Mild bilateral patchy  airspace opacities are noted concerning for multifocal pneumonia. Bony thorax is unremarkable. IMPRESSION: Stable support apparatus. Mild bilateral patchy airspace opacities are noted concerning for multifocal pneumonia. Electronically Signed   By: Lupita Raider M.D.   On: 06/14/2020 14:28   DG Abd 1 View  Result Date: 06/12/2020 CLINICAL DATA:  NG tube placement EXAM: ABDOMEN - 1 VIEW COMPARISON:  CT 05/22/2020 FINDINGS: Esophageal tube tip projects over the gastric outlet. Bowel gas pattern is unobstructed. IMPRESSION: Esophageal tube tip projects over the gastric outlet. Electronically Signed   By: Jasmine Pang  M.D.   On: 06/12/2020 19:03   ECHOCARDIOGRAM COMPLETE  Result Date: 06/13/2020    ECHOCARDIOGRAM REPORT   Patient Name:   Marcus Small Date of Exam: 06/12/2020 Medical Rec #:  [id number]          Height:       65.0 in Accession #:    [id number]         Weight:       198.4 lb Date of Birth:  1990-05-03           BSA:          1.971 m Patient Age:    29 years           BP:           158/85 mmHg Patient Gender: M                  HR:           140 bpm. Exam Location:  [facility] Procedure: 2D Echo, Cardiac Doppler and Color Doppler Indications:     Chest Pain 786.50 / R07.9  History:         Patient has no prior history of Echocardiogram examinations.                  Risk Factors:Hypertension.  Sonographer:     Neysa Bonito Roar Referring Phys:  [id number] PARDEEP ONGEX Diagnosing Phys: Yvonne Kendall MD IMPRESSIONS  1. Left ventricular ejection fraction, by estimation, is 70 to 75%. The left ventricle has hyperdynamic function. Left ventricular endocardial border not optimally defined to evaluate regional wall motion. There is mild left ventricular hypertrophy. Indeterminate diastolic filling due to E-A fusion.  2. Right ventricular systolic function is normal. The right ventricular size is normal. Tricuspid regurgitation signal is inadequate for assessing PA pressure.  3. The mitral valve is grossly normal.  No evidence of mitral valve regurgitation. No evidence of mitral stenosis.  4. The aortic valve has an indeterminant number of cusps. Aortic valve regurgitation is not visualized. No aortic stenosis is present. FINDINGS  Left Ventricle: Left ventricular ejection fraction, by estimation, is 70 to 75%. The left ventricle has hyperdynamic function. Left ventricular endocardial border not optimally defined to evaluate regional wall motion. The left ventricular internal cavity size was normal in size. There is mild left ventricular hypertrophy. Indeterminate diastolic filling due to E-A fusion. Right Ventricle: The right ventricular size is normal. No increase in right ventricular wall thickness. Right ventricular systolic function is normal. Tricuspid regurgitation signal is inadequate for assessing PA pressure. Left Atrium: Left atrial size was normal in size. Right Atrium: Right atrial size was normal in size. Pericardium: There is no evidence of pericardial effusion. Mitral Valve: The mitral valve is grossly normal. No evidence of mitral valve regurgitation. No evidence of mitral valve stenosis. Tricuspid Valve: The tricuspid valve is not well visualized. Tricuspid valve regurgitation is trivial. Aortic Valve: The aortic valve has an indeterminant number of cusps. Aortic valve regurgitation is not visualized. No aortic stenosis is present. Aortic valve peak gradient measures 4.5 mmHg. Pulmonic Valve: The pulmonic valve was not well visualized. Pulmonic valve regurgitation is not visualized. No evidence of pulmonic stenosis. Aorta: The aortic root is normal in size and structure. Pulmonary Artery: The pulmonary artery is not well seen. Venous: The inferior vena cava was not well visualized. IAS/Shunts: The interatrial septum was not well visualized.  LEFT VENTRICLE PLAX 2D LVIDd:         4.40  cm  Diastology LVIDs:         3.40 cm  LV e' medial:    9.14 cm/s LV PW:         1.00 cm  LV E/e' medial:  5.6 LV IVS:         1.30 cm  LV e' lateral:   6.96 cm/s LVOT diam:     2.00 cm  LV E/e' lateral: 7.3 LVOT Area:     3.14 cm  RIGHT VENTRICLE RV Mid diam:    2.80 cm RV S prime:     19.60 cm/s TAPSE (M-mode): 2.4 cm LEFT ATRIUM             Index       RIGHT ATRIUM           Index LA diam:        3.50 cm 1.78 cm/m  RA Area:     11.50 cm LA Vol (A2C):   28.7 ml 14.56 ml/m RA Volume:   23.40 ml  11.87 ml/m LA Vol (A4C):   39.2 ml 19.88 ml/m LA Biplane Vol: 35.7 ml 18.11 ml/m  AORTIC VALVE                PULMONIC VALVE AV Area (Vmax): 2.41 cm    PV Vmax:        1.10 m/s AV Vmax:        106.00 cm/s PV Peak grad:   4.8 mmHg AV Peak Grad:   4.5 mmHg    RVOT Peak grad: 4 mmHg LVOT Vmax:      81.40 cm/s  AORTA Ao Root diam: 2.60 cm MITRAL VALVE MV Area (PHT): 5.75 cm    SHUNTS MV Decel Time: 132 msec    Systemic Diam: 2.00 cm MV E velocity: 51.00 cm/s MV A velocity: 82.30 cm/s MV E/A ratio:  0.62 MV A Prime:    12.1 cm/s Yvonne Kendall MD Electronically signed by Yvonne Kendall MD Signature Date/Time: 06/13/2020/7:11:24 AM    Final    [place] EKG SITE RITE  Result Date: 06/14/2020 If Site Rite image not attached, placement could not be confirmed due to current cardiac rhythm.       Scheduled Meds: . Chlorhexidine Gluconate Cloth  6 each Topical Daily  . docusate  100 mg Per Tube BID  . enoxaparin (LOVENOX) injection  0.5 mg/kg Subcutaneous Q24H  . fentaNYL (SUBLIMAZE) injection  50 mcg Intravenous Once  . folic acid  1 mg Intravenous Daily  . metoprolol tartrate  7.5 mg Intravenous Q6H  . multivitamin with minerals  1 tablet Oral Daily  . pantoprazole (PROTONIX) IV  40 mg Intravenous Q24H  . polyethylene glycol  17 g Per Tube Daily  . sodium chloride flush  10-40 mL Intracatheter Q12H  . sodium chloride flush  3 mL Intravenous Q12H  . thiamine  100 mg Oral Daily   Or  . thiamine  100 mg Intravenous Daily   Continuous Infusions: . dexmedetomidine (PRECEDEX) IV infusion 0.4 mcg/kg/hr (06/14/20 1448)  . dextrose 5 %  and 0.9% NaCl 150 mL/hr at 06/14/20 1159  . fentaNYL infusion INTRAVENOUS 125 mcg/hr (06/14/20 1438)  . lactated ringers Stopped (06/13/20 1900)  . midazolam 4 mg/hr (06/14/20 1438)  . piperacillin-tazobactam (ZOSYN)  IV 3.375 g (06/14/20 1243)  . potassium chloride 10 mEq (06/14/20 1514)    Assessment & Plan:   Principal Problem:   Alcohol withdrawal seizure (HCC) Active Problems:   HTN (hypertension)   Alcohol  withdrawal (HCC)   Alcohol use   Alcoholic pancreatitis   Leukocytosis   AKI (acute kidney injury) (HCC)   Hypokalemia   High anion gap metabolic acidosis   Closed dislocation of left shoulder   Elevated troponin   Tachycardia   Acute pancreatitis   Alcohol withdrawal seizures Patient presents following seizure in the setting of stopping alcohol use after having abdominal pain for the last few days. Alcohol level is negative in ED CT head and C-spine were without acute abnormalities No history of seizures, presentation consistent with alcohol withdrawal seizure. Continue CIWA with Ativan, Stepdown Protocol Continue thiamine Continue folate Lactic acid normal, sepsis ruled out. 2/3- pt was requiring NGT and precedex drip. Now s/p intubation after I had seen him this am pccm will take over while intubated   Pancreatitis : Leukocytosis Patient with evidence of pancreatitis on CT and lipase elevated to 629 in the setting of known heavy alcohol use also admitted for withdrawal and withdrawal seizure as above. Continue IV fluids. No other infectious etiology noted to explain his leukocytosis, seizure may have contributed to some reactive leukocytosis as well. 3/2-continue n.p.o., NG tube decompression  Continue IV fluids  IV as needed pain meds     AKI / Hypokalemia / AGMA AKI resolved with IV hydration initially 3/2- creatine up again at 1/51. Continue ivf If worsens can consider nephrology consult hypokalmia resolved AGMA resolved   Left shoulder  dislocation and fracture This occurred before the witnessed seizure, possibility that he had an unwitnessed 1 prior does not recall trauma 3/2-Ortho was consulted recommended nonsurgical management with the arm in a sling and nonweightbearing on the left upper extremity     Tachycardia/Troponin elevation Initial high-sensitivity troponin elevated to 93 in the setting of seizure and tachycardia, suspect demand ischemia. Opponent trending up, cardiology consulted.  Suspect demand ischemia in the setting of pancreatitis. Continue Metoprolol 7.5 mg every 6 hours.  Hypertension: Blood pressure elevated in ED. Metoprolol 7.5 mg IV every 6 hours.  Depression: >Sister states patient has been suffering from significant depression and has thoughts of suicide recently. -This will need to be really assessed with patient as he becomes more alert -If he attempt to leave may need to be IVCD. Psych consulted,  will follow up recommendation.    DVT prophylaxis: lovenox Code Status:full Family Communication: none at bedside  Status is: Inpatient  Remains inpatient appropriate because:Inpatient level of care appropriate due to severity of illness   Dispo: The patient is from: Home              Anticipated d/c is to: TBD              Patient currently is not medically stable to d/c.   Difficult to place patient No            LOS: 2 days   Time spent: 35 min with >50% on coc   Lynn ItoSahar Arfa Lamarca, MD [facility] Pager [phone]  If 7PM-7AM, please contact night-coverage 06/14/2020, 3:27 PM

## 2020-06-14 NOTE — Consult Note (Signed)
[facility] Face-to-Face Psychiatry Consult   Reason for Consult: Consult for 30 year old man currently in the hospital with alcohol withdrawal Referring Physician: Kasa Patient Identification: Marcus Small MRN:  [id number] Principal Diagnosis: Delirium tremens [facility]) Diagnosis:  Principal Problem:   Delirium tremens (HCC) Active Problems:   Alcohol withdrawal seizure (HCC)   HTN (hypertension)   Alcohol withdrawal (HCC)   Alcohol use   Alcoholic pancreatitis   Leukocytosis   AKI (acute kidney injury) (HCC)   Hypokalemia   High anion gap metabolic acidosis   Closed dislocation of left shoulder   Elevated troponin   Tachycardia   Acute pancreatitis   Total Time spent with patient: 45 minutes  Subjective:   Marcus Small is a 30 y.o. male patient admitted with patient nonverbal.  HPI: Patient seen chart reviewed.  Spoke with nursing staff.  30 year old man came to the hospital with new onset seizures.  Reportedly had been drinking heavily and stopped a couple days previously.  Patient was admitted to the intensive care unit for worsening agitation and confusion.  Also showing signs of pancreatitis.  Still on Precedex at this point.  Nursing reports he becomes agitated and confused when the dosage is lowered.  Went to speak to him today.  He responded slightly to some of my questions but not in a consistently coherent manner.  Seem to be mostly confused and tired.  Past Psychiatric History: No identified past psychiatric history.  Has a history of alcohol abuse evidently.  Unknown if any treatment is ever taken place in the past  Risk to Self:   Risk to Others:   Prior Inpatient Therapy:   Prior Outpatient Therapy:    Past Medical History:  Past Medical History:  Diagnosis Date  . Anxiety   . Hypertension    History reviewed. No pertinent surgical history. Family History:  Family History  Problem Relation Age of Onset  . Hypertension Mother    Family Psychiatric   History: Unknown Social History:  Social History   Substance and Sexual Activity  Alcohol Use Yes  . Alcohol/week: 18.0 standard drinks  . Types: 18 Cans of beer per week   Comment: daily - beer plus 2 pints of liquor a day     Social History   Substance and Sexual Activity  Drug Use Not on file    Social History   Socioeconomic History  . Marital status: Single    Spouse name: Not on file  . Number of children: Not on file  . Years of education: Not on file  . Highest education level: Not on file  Occupational History  . Not on file  Tobacco Use  . Smoking status: Current Every Day Smoker    Packs/day: 0.50    Types: Cigarettes    Last attempt to quit: 04/17/2015    Years since quitting: 5.1  . Smokeless tobacco: Never Used  Substance and Sexual Activity  . Alcohol use: Yes    Alcohol/week: 18.0 standard drinks    Types: 18 Cans of beer per week    Comment: daily - beer plus 2 pints of liquor a day  . Drug use: Not on file  . Sexual activity: Not on file  Other Topics Concern  . Not on file  Social History Narrative  . Not on file   Social Determinants of Health   Financial Resource Strain: Not on file  Food Insecurity: Not on file  Transportation Needs: Not on file  Physical Activity: Not  on file  Stress: Not on file  Social Connections: Not on file   Additional Social History:    Allergies:   Allergies  Allergen Reactions  . Peanut-Containing Drug Products Itching  . Shellfish Allergy     Labs:  Results for orders placed or performed during the hospital encounter of 06/11/20 (from the past 48 hour(s))  Glucose, capillary     Status: None   Collection Time: 06/12/20 11:14 PM  Result Value Ref Range   Glucose-Capillary 78 70 - 99 mg/dL    Comment: Glucose reference range applies only to samples taken after fasting for at least 8 hours.  Glucose, capillary     Status: None   Collection Time: 06/13/20  1:20 AM  Result Value Ref Range    Glucose-Capillary 84 70 - 99 mg/dL    Comment: Glucose reference range applies only to samples taken after fasting for at least 8 hours.  Glucose, capillary     Status: None   Collection Time: 06/13/20  4:12 AM  Result Value Ref Range   Glucose-Capillary 73 70 - 99 mg/dL    Comment: Glucose reference range applies only to samples taken after fasting for at least 8 hours.  CBC     Status: Abnormal   Collection Time: 06/13/20  7:17 AM  Result Value Ref Range   WBC 12.8 (H) 4.0 - 10.5 K/uL   RBC 3.99 (L) 4.22 - 5.81 MIL/uL   Hemoglobin 12.2 (L) 13.0 - 17.0 g/dL   HCT 16.1 (L) 09.6 - 04.5 %   MCV 95.0 80.0 - 100.0 fL   MCH 30.6 26.0 - 34.0 pg   MCHC 32.2 30.0 - 36.0 g/dL   RDW 40.9 81.1 - 91.4 %   Platelets 238 150 - 400 K/uL   nRBC 0.0 0.0 - 0.2 %    Comment: Performed at [facility], [address], [place], [place] [address]  Magnesium     Status: None   Collection Time: 06/13/20  7:17 AM  Result Value Ref Range   Magnesium 2.2 1.7 - 2.4 mg/dL    Comment: Performed at [facility], [address] [place], [place] [address]  Phosphorus     Status: None   Collection Time: 06/13/20  7:17 AM  Result Value Ref Range   Phosphorus 3.4 2.5 - 4.6 mg/dL    Comment: Performed at [facility], [address] [place], [place] [address]  Basic metabolic panel     Status: Abnormal   Collection Time: 06/13/20  7:17 AM  Result Value Ref Range   Sodium 142 135 - 145 mmol/L   Potassium 3.8 3.5 - 5.1 mmol/L   Chloride 112 (H) 98 - 111 mmol/L   CO2 19 (L) 22 - 32 mmol/L   Glucose, Bld 75 70 - 99 mg/dL    Comment: Glucose reference range applies only to samples taken after fasting for at least 8 hours.   BUN 9 6 - 20 mg/dL   Creatinine, Ser 4.69 0.61 - 1.24 mg/dL   Calcium 8.4 (L) 8.9 - 10.3 mg/dL   GFR, Estimated >62 >95 mL/min    Comment: (NOTE) Calculated using the CKD-EPI Creatinine Equation (2021)    Anion gap 11 5 - 15    Comment: Performed at [facility], [address] [place], [place] [address]  TSH     Status: None   Collection Time: 06/13/20  7:17 AM  Result Value Ref Range   TSH 1.913  0.350 - 4.500 uIU/mL    Comment: Performed by a 3rd Generation assay with a functional sensitivity of <=0.01 uIU/mL. Performed at [facility], [address] [place], [place] [address]   Lipase, blood     Status: Abnormal   Collection Time: 06/13/20  7:17 AM  Result Value Ref Range   Lipase 108 (H) 11 - 51 U/L    Comment: Performed at [facility], [address] [place], [place] [address]  Glucose, capillary     Status: None   Collection Time: 06/13/20  7:31 AM  Result Value Ref Range   Glucose-Capillary 73 70 - 99 mg/dL    Comment: Glucose reference range applies only to samples taken after fasting for at least 8 hours.  Glucose, capillary     Status: Abnormal   Collection Time: 06/13/20 11:20 AM  Result Value Ref Range   Glucose-Capillary 57 (L) 70 - 99 mg/dL    Comment: Glucose reference range applies only to samples taken after fasting for at least 8 hours.   Comment 1 Notify RN   Glucose, capillary     Status: None   Collection Time: 06/13/20 12:05 PM  Result Value Ref Range   Glucose-Capillary 99 70 - 99 mg/dL    Comment: Glucose reference range applies only to samples taken after fasting for at least 8 hours.  Glucose, capillary     Status: None   Collection Time: 06/13/20  3:02 PM  Result Value Ref Range   Glucose-Capillary 83 70 - 99 mg/dL    Comment: Glucose reference range applies only to samples taken after fasting for at least 8 hours.  Glucose, capillary     Status: None   Collection Time: 06/13/20  7:33 PM  Result Value Ref Range   Glucose-Capillary 93 70 - 99 mg/dL    Comment: Glucose reference range applies only to samples taken after fasting for at least 8 hours.  Glucose, capillary     Status: Abnormal   Collection Time: 06/13/20 11:35 PM  Result Value Ref Range   Glucose-Capillary  108 (H) 70 - 99 mg/dL    Comment: Glucose reference range applies only to samples taken after fasting for at least 8 hours.  Glucose, capillary     Status: None   Collection Time: 06/14/20  4:12 AM  Result Value Ref Range   Glucose-Capillary 90 70 - 99 mg/dL    Comment: Glucose reference range applies only to samples taken after fasting for at least 8 hours.  CBC     Status: Abnormal   Collection Time: 06/14/20  7:19 AM  Result Value Ref Range   WBC 9.9 4.0 - 10.5 K/uL   RBC 3.72 (L) 4.22 - 5.81 MIL/uL   Hemoglobin 11.2 (L) 13.0 - 17.0 g/dL   HCT 09.835.7 (L) 11.939.0 - 14.752.0 %   MCV 96.0 80.0 - 100.0 fL   MCH 30.1 26.0 - 34.0 pg   MCHC 31.4 30.0 - 36.0 g/dL   RDW 82.912.8 56.211.5 - 13.015.5 %   Platelets 222 150 - 400 K/uL   nRBC 0.2 0.0 - 0.2 %    Comment: Performed at [facility], [address] [place], [place] [address]  Comprehensive metabolic panel     Status: Abnormal   Collection Time: 06/14/20  7:19 AM  Result Value Ref Range   Sodium 145 135 - 145 mmol/L   Potassium 3.7 3.5 - 5.1 mmol/L   Chloride 117 (H) 98 - 111 mmol/L  CO2 22 22 - 32 mmol/L   Glucose, Bld 109 (H) 70 - 99 mg/dL    Comment: Glucose reference range applies only to samples taken after fasting for at least 8 hours.   BUN 12 6 - 20 mg/dL   Creatinine, Ser 1.61 (H) 0.61 - 1.24 mg/dL   Calcium 8.4 (L) 8.9 - 10.3 mg/dL   Total Protein 6.0 (L) 6.5 - 8.1 g/dL   Albumin 2.5 (L) 3.5 - 5.0 g/dL   AST 76 (H) 15 - 41 U/L   ALT 27 0 - 44 U/L   Alkaline Phosphatase 75 38 - 126 U/L   Total Bilirubin 2.2 (H) 0.3 - 1.2 mg/dL   GFR, Estimated >09 >60 mL/min    Comment: (NOTE) Calculated using the CKD-EPI Creatinine Equation (2021)    Anion gap 6 5 - 15    Comment: Performed at [facility], [address], [place], [place] [address]  Magnesium     Status: None   Collection Time: 06/14/20  7:19 AM  Result Value Ref Range   Magnesium 2.4 1.7 - 2.4 mg/dL    Comment: Performed at [facility], [address] [place], [place] [address]  Phosphorus     Status: None   Collection Time: 06/14/20  7:19 AM  Result Value Ref Range   Phosphorus 2.6 2.5 - 4.6 mg/dL    Comment: Performed at [facility], [address], [place], [place] [address]  Amylase     Status: Abnormal   Collection Time: 06/14/20  7:19 AM  Result Value Ref Range   Amylase 217 (H) 28 - 100 U/L    Comment: Performed at [facility], [address] [place], [place] [address]  Lipase, blood     Status: Abnormal   Collection Time: 06/14/20  7:19 AM  Result Value Ref Range   Lipase 64 (H) 11 - 51 U/L    Comment: Performed at [facility], [address] [place], [place] [address]  Glucose, capillary     Status: Abnormal   Collection Time: 06/14/20  7:21 AM  Result Value Ref Range   Glucose-Capillary 104 (H) 70 - 99 mg/dL    Comment: Glucose reference range applies only to samples taken after fasting for at least 8 hours.  Glucose, capillary     Status: None   Collection Time: 06/14/20 12:41 PM  Result Value Ref Range   Glucose-Capillary 90 70 - 99 mg/dL    Comment: Glucose reference range applies only to samples taken after fasting for at least 8 hours.  Blood gas, arterial     Status: None   Collection Time: 06/14/20  2:15 PM  Result Value Ref Range   FIO2 0.40    Delivery systems VENTILATOR    Mode PRESSURE REGULATED VOLUME CONTROL    VT 500 mL   LHR 15 resp/min   Peep/cpap 5.0 cm H20   pH, Arterial 7.35 7.350 - 7.450   pCO2 arterial 43 32.0 - 48.0 mmHg   pO2, Arterial 98 83.0 - 108.0 mmHg   Bicarbonate 23.7 20.0 - 28.0 mmol/L   Acid-base deficit 2.0 0.0 - 2.0 mmol/L   O2 Saturation 97.3 %   Patient temperature 37.0    Collection site RIGHT RADIAL    Sample type ARTERIAL DRAW    Allens test (pass/fail) PASS PASS    Comment: Performed at [facility], [address] [place], [place] [address]  Procalcitonin - Baseline  Status: None   Collection Time: 06/14/20   2:41 PM  Result Value Ref Range   Procalcitonin 0.63 ng/mL    Comment:        Interpretation: PCT > 0.5 ng/mL and <= 2 ng/mL: Systemic infection (sepsis) is possible, but other conditions are known to elevate PCT as well. (NOTE)       Sepsis PCT Algorithm           Lower Respiratory Tract                                      Infection PCT Algorithm    ----------------------------     ----------------------------         PCT < 0.25 ng/mL                PCT < 0.10 ng/mL          Strongly encourage             Strongly discourage   discontinuation of antibiotics    initiation of antibiotics    ----------------------------     -----------------------------       PCT 0.25 - 0.50 ng/mL            PCT 0.10 - 0.25 ng/mL               OR       >80% decrease in PCT            Discourage initiation of                                            antibiotics      Encourage discontinuation           of antibiotics    ----------------------------     -----------------------------         PCT >= 0.50 ng/mL              PCT 0.26 - 0.50 ng/mL                AND       <80% decrease in PCT             Encourage initiation of                                             antibiotics       Encourage continuation           of antibiotics    ----------------------------     -----------------------------        PCT >= 0.50 ng/mL                  PCT > 0.50 ng/mL               AND         increase in PCT                  Strongly encourage  initiation of antibiotics    Strongly encourage escalation           of antibiotics                                     -----------------------------                                           PCT <= 0.25 ng/mL                                                 OR                                        > 80% decrease in PCT                                      Discontinue / Do not initiate                                              antibiotics  Performed at [facility], [address] [place], [place] [address]   Glucose, capillary     Status: Abnormal   Collection Time: 06/14/20  4:24 PM  Result Value Ref Range   Glucose-Capillary 107 (H) 70 - 99 mg/dL    Comment: Glucose reference range applies only to samples taken after fasting for at least 8 hours.    Current Facility-Administered Medications  Medication Dose Route Frequency Provider Last Rate Last Admin  . acetaminophen (TYLENOL) suppository 650 mg  650 mg Rectal Q6H PRN Eugenie Norrie, NP      . albuterol (VENTOLIN HFA) 108 (90 Base) MCG/ACT inhaler 2 puff  2 puff Inhalation Q6H PRN Synetta Fail, MD      . Chlorhexidine Gluconate Cloth 2 % PADS 6 each  6 each Topical Daily Synetta Fail, MD   6 each at 06/14/20 0930  . dexmedetomidine (PRECEDEX) 400 MCG/100ML (4 mcg/mL) infusion  0.4-1.2 mcg/kg/hr Intravenous Titrated Cipriano Bunker, MD 13.5 mL/hr at 06/14/20 1600 0.6 mcg/kg/hr at 06/14/20 1600  . dextrose 5 %-0.9 % sodium chloride infusion   Intravenous Continuous Cipriano Bunker, MD 150 mL/hr at 06/14/20 1600 Infusion Verify at 06/14/20 1600  . docusate (COLACE) 50 MG/5ML liquid 100 mg  100 mg Per Tube BID Harlon Ditty D, NP      . enoxaparin (LOVENOX) injection 45 mg  0.5 mg/kg Subcutaneous Q24H Synetta Fail, MD   45 mg at 06/14/20 0930  . fentaNYL (SUBLIMAZE) bolus via infusion 50 mcg  50 mcg Intravenous Q15 min PRN Judithe Modest, NP   50 mcg at 06/14/20 1730  . fentaNYL (SUBLIMAZE) injection 50 mcg  50 mcg Intravenous Once Harlon Ditty D, NP      . fentaNYL in NS (108mcg/ml) infusion-PREMIX  50-200 mcg/hr Intravenous Continuous Harlon Ditty  D, NP 15 mL/hr at 06/14/20 1600 150 mcg/hr at 06/14/20 1600  . folic acid injection 1 mg  1 mg Intravenous Daily Lynn Ito, MD   1 mg at 06/14/20 1544  . HYDROmorphone (DILAUDID) injection 1 mg  1 mg Intravenous Q4H PRN Cipriano Bunker, MD   1 mg at 06/13/20  1558  . lactated ringers infusion   Intravenous Continuous Manuela Schwartz, NP   Held at 06/13/20 1900  . metoprolol tartrate (LOPRESSOR) injection 7.5 mg  7.5 mg Intravenous Q6H End, Christopher, MD   7.5 mg at 06/14/20 1242  . midazolam (VERSED) 50 mg/50 mL (1 mg/mL) premix infusion  0-10 mg/hr Intravenous Continuous Harlon Ditty D, NP 6 mL/hr at 06/14/20 1600 6 mg/hr at 06/14/20 1600  . midazolam (VERSED) bolus via infusion 1-2 mg  1-2 mg Intravenous Q2H PRN Harlon Ditty D, NP   2 mg at 06/14/20 1553  . multivitamin with minerals tablet 1 tablet  1 tablet Oral Daily Synetta Fail, MD   1 tablet at 06/12/20 [phone]  . ondansetron (ZOFRAN) injection 4 mg  4 mg Intravenous Q6H PRN Cipriano Bunker, MD   4 mg at 06/12/20 1321  . pantoprazole (PROTONIX) injection 40 mg  40 mg Intravenous Q24H Eugenie Norrie, NP   40 mg at 06/13/20 2115  . piperacillin-tazobactam (ZOSYN) IVPB 3.375 g  3.375 g Intravenous Q8H Kasa, Wallis Bamberg, MD 12.5 mL/hr at 06/14/20 1600 Infusion Verify at 06/14/20 1600  . polyethylene glycol (MIRALAX / GLYCOLAX) packet 17 g  17 g Per Tube Daily Harlon Ditty D, NP      . sodium chloride flush (NS) 0.9 % injection 10-40 mL  10-40 mL Intracatheter Q12H Lynn Ito, MD   30 mL at 06/14/20 1235  . sodium chloride flush (NS) 0.9 % injection 10-40 mL  10-40 mL Intracatheter PRN Lynn Ito, MD      . sodium chloride flush (NS) 0.9 % injection 3 mL  3 mL Intravenous Q12H Synetta Fail, MD   3 mL at 06/14/20 0930  . thiamine tablet 100 mg  100 mg Oral Daily Synetta Fail, MD       Or  . thiamine (B-1) injection 100 mg  100 mg Intravenous Daily Synetta Fail, MD   100 mg at 06/14/20 0930    Musculoskeletal: Strength & Muscle Tone: decreased Gait & Station: normal Patient leans: N/A  Psychiatric Specialty Exam: Physical Exam Vitals and nursing note reviewed.  Constitutional:      Appearance: He is well-developed and well-nourished.  HENT:     Head:  Normocephalic and atraumatic.  Eyes:     Conjunctiva/sclera: Conjunctivae normal.     Pupils: Pupils are equal, round, and reactive to light.  Cardiovascular:     Heart sounds: Normal heart sounds.  Pulmonary:     Effort: Pulmonary effort is normal.  Abdominal:     Palpations: Abdomen is soft.  Musculoskeletal:        General: Normal range of motion.     Cervical back: Normal range of motion.  Skin:    General: Skin is warm and dry.  Neurological:     General: No focal deficit present.     Mental Status: He is alert.  Psychiatric:        Attention and Perception: He is inattentive.     Review of Systems  Unable to perform ROS: Mental status change    Blood pressure 114/79, pulse 91, temperature 99.4 F (37.4 C), temperature source  Oral, resp. rate 14, height 5\' 5"  (1.651 m), weight 90 kg, SpO2 95 %.Body mass index is 33.02 kg/m.  General Appearance: Disheveled  Eye Contact:  Minimal  Speech:  Slow  Volume:  Decreased  Mood:  Dysphoric  Affect:  Flat  Thought Process:  Disorganized  Orientation:  Negative  Thought Content:  Negative  Suicidal Thoughts:  No  Homicidal Thoughts:  No  Memory:  Negative  Judgement:  Negative  Insight:  Negative  Psychomotor Activity:  Negative  Concentration:  Concentration: Negative  Recall:  Negative  Fund of Knowledge:  Negative  Language:  Negative  Akathisia:  Negative  Handed:  Right  AIMS (if indicated):     Assets:  Resilience  ADL's:  Impaired  Cognition:  Impaired,  Mild  Sleep:        Treatment Plan Summary: Plan Patient having DTs.  Currently on Precedex.  Nursing gradually working to decrease dose as tolerated.  No indication for any different intervention at this point.  Once he wakes up we can talk about whether he has any interest in further treatment.  No orders written.  Disposition: Patient does not meet criteria for psychiatric inpatient admission.  , MD 06/14/2020 5:40 PM

## 2020-06-14 NOTE — Procedures (Signed)
Intubation Procedure Note  Marcus Small  [id number]  April 01, 1991  Date:06/14/20  Time:1:44 PM   Provider Performing:Daylon Lafavor D Elvina Sidle    Procedure: Intubation ([address]00)  Indication(s) Respiratory Failure  Consent Risks of the procedure as well as the alternatives and risks of each were explained to the patient and/or caregiver.  Consent for the procedure was obtained and is signed in the bedside chart   Anesthesia Etomidate, Fentanyl and Rocuronium   Time Out Verified patient identification, verified procedure, site/side was marked, verified correct patient position, special equipment/implants available, medications/allergies/relevant history reviewed, required imaging and test results available.   Sterile Technique Usual hand hygeine, masks, and gloves were used   Procedure Description Patient positioned in bed supine.  Sedation given as noted above.  Patient was intubated with endotracheal tube using Glidescope.  View was Grade 1 full glottis .  Number of attempts was 1.  Colorimetric CO2 detector was consistent with tracheal placement.   Complications/Tolerance None; patient tolerated the procedure well. Chest X-ray is ordered to verify placement.   EBL Minimal   Specimen(s) None   Size 8.0 ETT  Secured at 23 cm at the lip.     Harlon Ditty, AGACNP-BC [facility] Pager: [phone]

## 2020-06-14 NOTE — Progress Notes (Signed)
GOALS OF CARE DISCUSSION  The Clinical status was relayed to family in detail. Mother Ms Marcus Small  Updated and notified of patients medical condition.  Patient remains unresponsive and will not open eyes to command.   Severe Pancreatitis Patient is having a weak cough and struggling to remove secretions.   Patient with increased WOB and using accessory muscles to breathe Explained to family course of therapy and the modalities     Mother  understands the situation.  Patient remains FULL CODE Will plan for emergent intubation  Family are satisfied with Plan of action and management. All questions answered  Additional CC time 32 mins   Marcus Small Santiago Glad, M.D.  [facility]  Medical Director [facility] Medical Director [facility] Cardio-Pulmonary Department

## 2020-06-15 ENCOUNTER — Inpatient Hospital Stay: Payer: Self-pay

## 2020-06-15 ENCOUNTER — Encounter: Payer: Self-pay | Admitting: Family Medicine

## 2020-06-15 DIAGNOSIS — K8522 Alcohol induced acute pancreatitis with infected necrosis: Secondary | ICD-10-CM

## 2020-06-15 LAB — COMPREHENSIVE METABOLIC PANEL
ALT: 28 U/L (ref 0–44)
AST: 73 U/L — ABNORMAL HIGH (ref 15–41)
Albumin: 2.3 g/dL — ABNORMAL LOW (ref 3.5–5.0)
Alkaline Phosphatase: 73 U/L (ref 38–126)
Anion gap: 6 (ref 5–15)
BUN: 13 mg/dL (ref 6–20)
CO2: 22 mmol/L (ref 22–32)
Calcium: 8.4 mg/dL — ABNORMAL LOW (ref 8.9–10.3)
Chloride: 119 mmol/L — ABNORMAL HIGH (ref 98–111)
Creatinine, Ser: 1.97 mg/dL — ABNORMAL HIGH (ref 0.61–1.24)
GFR, Estimated: 47 mL/min — ABNORMAL LOW (ref >60)
Glucose, Bld: 110 mg/dL — ABNORMAL HIGH (ref 70–99)
Potassium: 3.9 mmol/L (ref 3.5–5.1)
Sodium: 147 mmol/L — ABNORMAL HIGH (ref 135–145)
Total Bilirubin: 2.1 mg/dL — ABNORMAL HIGH (ref 0.3–1.2)
Total Protein: 6.3 g/dL — ABNORMAL LOW (ref 6.5–8.1)

## 2020-06-15 LAB — GLUCOSE, CAPILLARY
Glucose-Capillary: 100 mg/dL — ABNORMAL HIGH (ref 70–99)
Glucose-Capillary: 100 mg/dL — ABNORMAL HIGH (ref 70–99)
Glucose-Capillary: 102 mg/dL — ABNORMAL HIGH (ref 70–99)
Glucose-Capillary: 105 mg/dL — ABNORMAL HIGH (ref 70–99)
Glucose-Capillary: 108 mg/dL — ABNORMAL HIGH (ref 70–99)
Glucose-Capillary: 110 mg/dL — ABNORMAL HIGH (ref 70–99)
Glucose-Capillary: 96 mg/dL (ref 70–99)

## 2020-06-15 LAB — PROCALCITONIN: Procalcitonin: 0.89 ng/mL

## 2020-06-15 LAB — CBC
HCT: 34.2 % — ABNORMAL LOW (ref 39.0–52.0)
Hemoglobin: 10.5 g/dL — ABNORMAL LOW (ref 13.0–17.0)
MCH: 30.9 pg (ref 26.0–34.0)
MCHC: 30.7 g/dL (ref 30.0–36.0)
MCV: 100.6 fL — ABNORMAL HIGH (ref 80.0–100.0)
Platelets: 255 10*3/uL (ref 150–400)
RBC: 3.4 MIL/uL — ABNORMAL LOW (ref 4.22–5.81)
RDW: 13.2 % (ref 11.5–15.5)
WBC: 9.5 10*3/uL (ref 4.0–10.5)
nRBC: 0.8 % — ABNORMAL HIGH (ref 0.0–0.2)

## 2020-06-15 LAB — MYCOPLASMA PNEUMONIAE ANTIBODY, IGM: Mycoplasma pneumo IgM: <770 U/mL (ref 0–769)

## 2020-06-15 LAB — PHOSPHORUS: Phosphorus: 4 mg/dL (ref 2.5–4.6)

## 2020-06-15 LAB — MAGNESIUM: Magnesium: 2.1 mg/dL (ref 1.7–2.4)

## 2020-06-15 LAB — LIPASE, BLOOD: Lipase: 52 U/L — ABNORMAL HIGH (ref 11–51)

## 2020-06-15 LAB — AMYLASE: Amylase: 142 U/L — ABNORMAL HIGH (ref 28–100)

## 2020-06-15 MED ORDER — PROSOURCE TF PO LIQD
45.0000 mL | Freq: Three times a day (TID) | ORAL | Status: DC
  Administered 2020-06-15 – 2020-06-20 (×13): 45 mL
  Filled 2020-06-15 (×17): qty 45

## 2020-06-15 MED ORDER — IOHEXOL 300 MG/ML  SOLN
100.0000 mL | Freq: Once | INTRAMUSCULAR | Status: AC | PRN
  Administered 2020-06-15: 100 mL via INTRAVENOUS

## 2020-06-15 MED ORDER — ORAL CARE MOUTH RINSE
15.0000 mL | OROMUCOSAL | Status: DC
  Administered 2020-06-15 – 2020-06-23 (×67): 15 mL via OROMUCOSAL

## 2020-06-15 MED ORDER — CHLORHEXIDINE GLUCONATE 0.12% ORAL RINSE (MEDLINE KIT)
15.0000 mL | Freq: Two times a day (BID) | OROMUCOSAL | Status: DC
  Administered 2020-06-15 – 2020-06-25 (×15): 15 mL via OROMUCOSAL

## 2020-06-15 MED ORDER — VITAL 1.5 CAL PO LIQD
1000.0000 mL | ORAL | Status: DC
  Administered 2020-06-15 – 2020-06-19 (×2): 1000 mL

## 2020-06-15 MED ORDER — SODIUM CHLORIDE 0.9% FLUSH
5.0000 mL | Freq: Three times a day (TID) | INTRAVENOUS | Status: DC
  Administered 2020-06-15 – 2020-06-26 (×32): 5 mL

## 2020-06-15 MED ORDER — IOHEXOL 9 MG/ML PO SOLN
500.0000 mL | ORAL | Status: AC
  Administered 2020-06-15 (×2): 500 mL via ORAL

## 2020-06-15 MED ORDER — VITAL HIGH PROTEIN PO LIQD: 1000.0000 mL | ORAL | Status: DC

## 2020-06-15 MED ORDER — MIDAZOLAM BOLUS VIA INFUSION
4.0000 mg | INTRAVENOUS | Status: AC
  Administered 2020-06-15: 4 mg via INTRAVENOUS
  Filled 2020-06-15: qty 4

## 2020-06-15 NOTE — Progress Notes (Signed)
Rt assisted with patient transport from ICU to CT then to IR and back to ICU with no complications. Patient was ventilated with the trilogy transport vent the entire trip.

## 2020-06-15 NOTE — Consult Note (Signed)
Please psychiatry follow-up: Came by to see patient.  Patient has taken a downturn since my last visit and is now on a ventilator with higher dose Precedex.  Spoke briefly with nursing.  No intervention from psychiatry at this point.  I will follow as needed.

## 2020-06-15 NOTE — Procedures (Signed)
Pre procedural Dx: Pancreatitis Post procedural Dx: Same  Technically successful CT guided placed of a 12 Fr drainage catheter placement into the indeterminate fluid collection within the ventral aspect of the abdomen yielding 80 cc of bilious appearing fluid.    A representative aspirated sample was capped and sent to the laboratory for analysis.    EBL: Trace Complications: None immediate  Katherina Right, MD Pager #: [phone]

## 2020-06-15 NOTE — Progress Notes (Signed)
Initial Nutrition Assessment  DOCUMENTATION CODES:   Not applicable  INTERVENTION:  Recommend placement of post-pyloric small-bore feeding tube (Dobbhoff) that ideally terminates at the ligament of Treitz. Once tube in place recommend: -Initiate Vital 1.5 Cal at 15 mL/hr and advance by 20 mL/hr every 12 hours to goal rate of 55 mL/hr -Provide PROSource TF 45 mL TID per tube -Goal regimen provides 2100 kcal, 122 grams of protein, 1003 mL H2O daily  Monitor magnesium, potassium, and phosphorus daily for at least 3 days, MD to replete as needed, as pt is at risk for refeeding syndrome.  NUTRITION DIAGNOSIS:   Inadequate oral intake related to inability to eat as evidenced by NPO status.  GOAL:   Patient will meet greater than or equal to 90% of their needs  MONITOR:   Vent status,Labs,Weight trends,TF tolerance,I & O's  REASON FOR ASSESSMENT:   Ventilator,Consult Enteral/tube feeding initiation and management  ASSESSMENT:   30 year old male with PMHx of HTN, anxiety, EtOH abuse admitted with left glenohumeral joint dislocation with greater tuberosity fracture s/p left shoulder reduction in ER, pancreatitis, seizure activity secondary to severe EtOH withdrawal.   3/2 intubated  Patient is currently intubated on ventilator support MV: 7.5 L/min Temp (24hrs), Avg:99.6 F (37.6 C), Min:98.6 F (37 C), Max:100.2 F (37.9 C)  Medications reviewed and include: Colace 100 mg BID, folic acid 1 mg daily IV, MVI daily, Protonix, Miralax 17 grams daily, thiamine 100 mg daily, Precedex gtt, D5-NS at 150 mL/hr, fentanyl gtt, Versed gtt, Zosyn.  Labs reviewed: CBG 100-105, Sodium 147, Chloride 119, Creatinine 1.97, Lipase 52.  I/O: 1895 mL UOP Yesterday (0.9 mL/kg/hr) + 2 occurrences UOP yesterday; 700 mL output yesterday from NGT  Enteral Access: 14 Fr. NGT placed 2/28; tip projects over the gastric outlet per abdominal x-ray 2/28; currently to LIS  Patient does not meet criteria  for malnutrition at this time.  Discussed on rounds. Plan is to remove NGT, place a post-pyloric small bore feeding tube (Dobbhoff) that ideally terminates at the ligament of Treitz for tube feeds, and then place an OGT to LIS to decompress stomach. Due to patient's body habitus (appears to have large amount of lean body mass) suspect patient is not truly obese and would not benefit from hypocaloric tube feeds.  NUTRITION - FOCUSED PHYSICAL EXAM:  Flowsheet Row Most Recent Value  Orbital Region No depletion  Upper Arm Region No depletion  Thoracic and Lumbar Region No depletion  Buccal Region Unable to assess  Temple Region No depletion  Clavicle Bone Region No depletion  Clavicle and Acromion Bone Region No depletion  Scapular Bone Region Unable to assess  Dorsal Hand No depletion  Patellar Region No depletion  Anterior Thigh Region No depletion  Posterior Calf Region No depletion  Edema (RD Assessment) None  Hair Reviewed  Eyes Unable to assess  Mouth Unable to assess  Skin Reviewed  Nails Reviewed     Diet Order:   Diet Order            Diet NPO time specified  Diet effective now                EDUCATION NEEDS:   No education needs have been identified at this time  Skin:  Skin Assessment: Reviewed RN Assessment  Last BM:  Unknown/PTA  Height:   Ht Readings from Last 1 Encounters:  06/14/20 5\' 5"  (1.651 m)   Weight:   Wt Readings from Last 1 Encounters:  06/11/20  90 kg   Ideal Body Weight:  61.8 kg  BMI:  Body mass index is 33.02 kg/m.  Estimated Nutritional Needs:   Kcal:  2076  Protein:  115-130 grams  Fluid:  >/= 2 L/day  Felix Pacini, MS, RD, LDN Pager number available on Amion

## 2020-06-15 NOTE — Progress Notes (Signed)
Patient transported to CT via transport vent with RN.  Patient tolerated well.

## 2020-06-15 NOTE — Progress Notes (Signed)
Brief Pharmacy Note  Consult to restart VTE prophylaxis post-IR procedure. Enoxaparin dose held this morning for pending procedure. Next dose due 3/4 am. Okay to continue with current schedule.  Laureen Ochs, PharmD

## 2020-06-15 NOTE — Consult Note (Signed)
SURGICAL CONSULTATION NOTE   HISTORY OF PRESENT ILLNESS (HPI):  30 y.o. male currently in ICU due to severe alcohol withdrawal and pancreatitis.  Patient is sedated, on mechanical ventilation.  History taken from ICU nurse petitioner.   Patient was initially seen and admitted due to alcohol withdrawal.  He decompensated and developed delirium tremens and eventually respiratory distress and needed to be mechanically intubated.  He has been also showing deterioration from bronchitis.  CT scan of the abdomen now shows worsening inflammation around the pancreas and a large fluid collection around the pancreas.  I personally evaluated the images of the CT scan.  The white blood cell counts are normalized.  Lipase is also normalized.  Minimally increasing creatinine.  Surgery is consulted by Dr. Belia Heman in this context for evaluation and management of pancreatitis.  PAST MEDICAL HISTORY (PMH):  Past Medical History:  Diagnosis Date  . Anxiety   . Hypertension      PAST SURGICAL HISTORY (PSH):  History reviewed. No pertinent surgical history.   MEDICATIONS:  Prior to Admission medications   Medication Sig Start Date End Date Taking? Authorizing Provider  Aspirin-Acetaminophen-Caffeine (GOODY HEADACHE PO) Take 1 packet by mouth as needed.   Yes [provider]  ibuprofen (ADVIL) 200 MG tablet Take 200 mg by mouth every 6 (six) hours as needed for headache.   Yes [provider]  albuterol (VENTOLIN HFA) 108 (90 Base) MCG/ACT inhaler Inhale 2 puffs into the lungs every 6 (six) hours as needed for wheezing or shortness of breath. Patient not taking: No sig reported 12/02/18   Don Perking, [place], MD  ketorolac (TORADOL) 10 MG tablet Take 1 tablet (10 mg total) by mouth every 8 (eight) hours as needed. Patient not taking: No sig reported 08/10/15   Darci Current, MD  meloxicam (MOBIC) 15 MG tablet Take 1 tablet (15 mg total) by mouth daily. Patient not taking: No sig reported  09/05/19   Cuthriell, Christiane Ha D, PA-C  ondansetron (ZOFRAN ODT) 4 MG disintegrating tablet Take 1 tablet (4 mg total) by mouth every 8 (eight) hours as needed. Patient not taking: No sig reported 12/02/18   Nita Sickle, MD     ALLERGIES:  Allergies  Allergen Reactions  . Peanut-Containing Drug Products Itching  . Shellfish Allergy      SOCIAL HISTORY:  Social History   Socioeconomic History  . Marital status: Single    Spouse name: Not on file  . Number of children: Not on file  . Years of education: Not on file  . Highest education level: Not on file  Occupational History  . Not on file  Tobacco Use  . Smoking status: Current Every Day Smoker    Packs/day: 0.50    Types: Cigarettes    Last attempt to quit: 04/17/2015    Years since quitting: 5.1  . Smokeless tobacco: Never Used  Substance and Sexual Activity  . Alcohol use: Yes    Alcohol/week: 18.0 standard drinks    Types: 18 Cans of beer per week    Comment: daily - beer plus 2 pints of liquor a day  . Drug use: Not on file  . Sexual activity: Not on file  Other Topics Concern  . Not on file  Social History Narrative  . Not on file   Social Determinants of Health   Financial Resource Strain: Not on file  Food Insecurity: Not on file  Transportation Needs: Not on file  Physical Activity: Not on file  Stress:  Not on file  Social Connections: Not on file  Intimate Partner Violence: Not on file      FAMILY HISTORY:  Family History  Problem Relation Age of Onset  . Hypertension Mother      REVIEW OF SYSTEMS:  Unable to take review of system due to patient is sedated on mechanical ventilation  All other review of systems were negative   VITAL SIGNS:  Temp:  [98.6 F (37 C)-100.2 F (37.9 C)] 98.6 F (37 C) (03/03 1600) Pulse Rate:  [88-98] 88 (03/03 1700) Resp:  [8-27] 15 (03/03 1700) BP: (107-150)/(49-97) 143/94 (03/03 1700) SpO2:  [100 %] 100 % (03/03 1700) FiO2 (%):  [40 %] 40 % (03/03  1315)     Height: 5\' 5"  (165.1 cm) Weight: 90 kg BMI (Calculated): 33.02   INTAKE/OUTPUT:  This shift: Total I/O In: 2006.3 [I.V.:1944.2; IV Piggyback:62.1] Out: 1850 [Urine:1850]  Last 2 shifts: @IOLAST2SHIFTS @   PHYSICAL EXAM:  Constitutional:  --Critically ill, sedated on mechanical ventilation Eyes:  -- Pupils equally round and reactive to light  -- No scleral icterus  Ear, nose, and throat:  -- No jugular venous distension  Pulmonary:  -- No crackles  -- Equal breath sounds bilaterally -- Breathing non-labored at rest Cardiovascular:  -- S1, S2 present  -- No pericardial rubs Gastrointestinal:  -- Abdomen soft, non-distended -- No abdominal masses appreciated, pulsatile or otherwise  Musculoskeletal and Integumentary:  -- Wounds: None appreciated -- Extremities: B/L UE and LE FROM, hands and feet warm, no edema  Neurologic:  --Sedated   Labs:  CBC Latest Ref Rng & Units 06/15/2020 06/14/2020 06/13/2020  WBC 4.0 - 10.5 K/uL 9.5 9.9 12.8(H)  Hemoglobin 13.0 - 17.0 g/dL 10.5(L) 11.2(L) 12.2(L)  Hematocrit 39.0 - 52.0 % 34.2(L) 35.7(L) 37.9(L)  Platelets 150 - 400 K/uL 255 222 238   CMP Latest Ref Rng & Units 06/15/2020 06/14/2020 06/13/2020  Glucose 70 - 99 mg/dL 08/14/2020) 08/13/2020) 75  BUN 6 - 20 mg/dL 13 12 9   Creatinine 0.61 - 1.24 mg/dL 810(F) 751(W)  Sodium 135 - 145 mmol/L 147(H) 145 142  Potassium 3.5 - 5.1 mmol/L 3.9 3.7 3.8  Chloride 98 - 111 mmol/L 119(H) 117(H) 112(H)  CO2 22 - 32 mmol/L 22 22 19(L)  Calcium 8.9 - 10.3 mg/dL 2.58(N) 2.77(O) 2.42)  Total Protein 6.5 - 8.1 g/dL 6.3(L) 6.0(L) -  Total Bilirubin 0.3 - 1.2 mg/dL 2.1(H) 2.2(H) -  Alkaline Phos 38 - 126 U/L 73 75 -  AST 15 - 41 U/L 73(H) 76(H) -  ALT 0 - 44 U/L 28 27 -     Imaging studies:  EXAM: CT ABDOMEN AND PELVIS WITH CONTRAST  TECHNIQUE: Multidetector CT imaging of the abdomen and pelvis was performed using the standard protocol following bolus administration of intravenous  contrast.  CONTRAST:  3.5(T OMNIPAQUE IOHEXOL 300 MG/ML  SOLN  COMPARISON:  06/11/2020  FINDINGS: Lower chest: Small pleural effusions and bilateral lower lobe opacity with volume loss. The central line is seen into the upper right atrium.  Hepatobiliary: No focal liver abnormality.No evidence of biliary obstruction or stone.  Pancreas: Acute pancreatitis with diffuse parenchymal and adjacent edema. Patchy poor enhancement the uncinate process suggesting mild necrosis. There is progressive inflammation posterior to the pancreas where the portal vein confluence is severely attenuated but patent without visible luminal thrombus. No pseudoaneurysm is seen.  Inferior to the gastric body is a 9 cm low-density collection which is focal but does not show rim enhancement.  Increased retroperitoneal edema. Small volume ascites.  Spleen: Unremarkable.  Adrenals/Urinary Tract: Negative adrenals. No hydronephrosis or stone. Unremarkable bladder.  Stomach/Bowel:  No obstruction. No appendicitis.  Vascular/Lymphatic: No acute vascular abnormality. No mass or adenopathy.  Reproductive:No pathologic findings.  Other: No pneumoperitoneum.  Musculoskeletal: No acute abnormalities.  IMPRESSION: 1. Acute edematous pancreatitis with progressive inflammation and possible mild necrosis at the uncinate. 2. 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 3. 9 cm non organized fluid collection at the gastrocolic ligament. 4. Lower lobe atelectasis and small pleural effusions, new. 5. Small volume ascites.   Electronically Signed   By: Marnee Spring M.D.   On: 06/15/2020 10:05   Assessment/Plan:  30 y.o. male with severe proctitis, complicated by pertinent comorbidities including respiratory failure due to alcohol withdrawal.  Patient with severe appendicitis with large fluid collection.  At the moment of  my evaluation patient already had percutaneous drain of the large fluid collection.  There is no sign of septic shock or infected necrotizing proctitis.  There is no surgical management needed at this moment.  My recommendation is that if this patient continue deteriorating from a pancreatitis standpoint there should be a low threshold to transfer to a tertiary center with experience of necrosectomies.  Otherwise the management is supportive with aggressive IV fluids, pain management, IV antibiotic therapy as it is already been done.  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  Gae Gallop, MD

## 2020-06-15 NOTE — Progress Notes (Signed)
GOALS OF CARE DISCUSSION  The Clinical status was relayed to family in detail. Mother over the phone  Updated and notified of patients medical condition.  Patient remains unresponsive and will not open eyes to command.    Patient is having a weak cough and struggling to remove secretions.   Patient with increased WOB and using accessory muscles to breathe Explained to family course of therapy and the modalities     Patient with Progressive multiorgan failure with a very high probablity of a very minimal chance of meaningful recovery despite all aggressive and optimal medical therapy.  CT ABD SHOWS large fluid collection Plan for CT draining and catheter placement Patient also with progressive renal failure-plan for Nephrology consultation  Family understands the situation.  Patient remains full code  Family are satisfied with Plan of action and management. All questions answered  Additional CC time 35 mins   Won Kreuzer Santiago Glad, M.D.  [facility]  Medical Director [facility] Medical Director [facility] Cardio-Pulmonary Department

## 2020-06-15 NOTE — Progress Notes (Signed)
CRITICAL CARE NOTE 30 yo male admitted with left glenohumeral joint dislocation with greater tuberosity fracture pt s/p left shoulder reduction in ER; pancreatitis; and seizure activity secondary to severe ETOH withdrawal   heavy drinker and stopped drinking 2 days prior to ER presentation.  Per ER notes he had a witnessed tonic-clonic seizure that lasted a few minutes at home prior to arrival to the ER  Left shoulder x-ray concerning for anterior inferior glenohumeral fracture dislocation. In the ER left shoulder reduction performed and left shoulder immobilizer placed  worsening ETOH withdrawal symptoms despite prn ativan requiring precedex gtt.  Pt changed to ICU status and PCCM team consulted 03/1 to assist with management.   Increased WOB and SOB  Procedures:  Left Shoulder Reduction   Significant Diagnostic Tests:  CT Abd Pelvis 02/27>>Findings consistent with acute interstitial pancreatitis. Correlation with lipase is recommended. Small volume intraperitoneal and retroperitoneal free fluid. Trace bilateral pleural effusions. Distended urinary bladder. CT Head Cervical Spine 02/27>>No acute intracranial abnormality. No acute fracture or malalignment of the spine. CT Shoulder Left 02/28>>Interval reduction of the anterior dislocation with the glenohumeral joint in near anatomic location. Comminuted slightly impacted Hill-Sachs deformity which involve the anterior bicipital groove.  Micro Data:  Respiratory Panel by RT-PCR 02/27>>negative  Blood x2 02/28>>negative     Consults:  02/28: Pt initially admitted to the progressive care unit, however status changed to ICU for closer monitoring 03/1: Pt with worsening ETOH withdrawal symptoms requiring precedex gtt.  PCCM team consulted to assist with management  03/1: Orthopedic consulted for left glenohumeral joint dislocation with greater tuberosity fracture pt s/p left shoulder reduction in ER.  Per orthopedic surgery pt opting to  proceed with non surgical management with the arm in a sling and non weightbearing of the left upper extremity    Significant Hospital Events:  02/28 severe ETOH withdrawal, severe pancreatitis 3/1 severe pancreatitis 3/2 increased WOB and SOB, severe DT's, patient emergently intubated 3/2 PICC line placed 3/3 remains intubated   CC  follow up respiratory failure  SUBJECTIVE Patient remains critically ill Prognosis is guarded Severe resp failure +severe pancreatitis Progressive renal failure   Vent Mode: PRVC FiO2 (%):  [21 %-40 %] 40 % Set Rate:  [15 bmp] 15 bmp Vt Set:  [500 mL] 500 mL PEEP:  [5 cmH20] 5 cmH20 Plateau Pressure:  [26 cmH20] 26 cmH20  CBC    Component Value Date/Time   WBC 9.5 06/15/2020 0523   RBC 3.40 (L) 06/15/2020 0523   HGB 10.5 (L) 06/15/2020 0523   HGB 14.9 08/18/2012 1154   HCT 34.2 (L) 06/15/2020 0523   HCT 44.8 08/18/2012 1154   PLT 255 06/15/2020 0523   PLT 349 08/18/2012 1154   MCV 100.6 (H) 06/15/2020 0523   MCV 89 08/18/2012 1154   MCH 30.9 06/15/2020 0523   MCHC 30.7 06/15/2020 0523   RDW 13.2 06/15/2020 0523   RDW 13.2 08/18/2012 1154   LYMPHSABS 0.6 (L) 06/11/2020 2132   MONOABS 1.0 06/11/2020 2132   EOSABS 0.0 06/11/2020 2132   BASOSABS 0.1 06/11/2020 2132   BMP Latest Ref Rng & Units 06/15/2020 06/14/2020 06/13/2020  Glucose 70 - 99 mg/dL 694(W) 546(E) 75  BUN 6 - 20 mg/dL 13 12 9   Creatinine 0.61 - 1.24 mg/dL ) 7.03(J) 0.09(F  Sodium 135 - 145 mmol/L 147(H) 145 142  Potassium 3.5 - 5.1 mmol/L 3.9 3.7 3.8  Chloride 98 - 111 mmol/L 119(H) 117(H) 112(H)  CO2 22 - 32 mmol/L 22 22  19(L)  Calcium 8.9 - 10.3 mg/dL 7.8(E) 4.2(P) 5.3(I)      BP 140/85   Pulse 90   Temp 100.2 F (37.9 C) (Axillary)   Resp 15   Ht 5\' 5"  (1.651 m)   Wt 90 kg   SpO2 100%   BMI 33.02 kg/m    I/O last 3 completed shifts: In: 6898.5 [I.V.:6401.8; IV Piggyback:496.7] Out: 3295 [Urine:1895; Emesis/NG output:1400] No intake/output data  recorded.  SpO2: 100 % O2 Flow Rate (L/min): 2 L/min FiO2 (%): 40 %  Estimated body mass index is 33.02 kg/m as calculated from the following:   Height as of this encounter: 5\' 5"  (1.651 m).   Weight as of this encounter: 90 kg.  SIGNIFICANT EVENTS   REVIEW OF SYSTEMS  PATIENT IS UNABLE TO PROVIDE COMPLETE REVIEW OF SYSTEMS DUE TO SEVERE CRITICAL ILLNESS        PHYSICAL EXAMINATION:  GENERAL:critically ill appearing, +resp distress EYES: Pupils equal, round, reactive to light.  No scleral icterus.  MOUTH: Moist mucosal membrane. NECK: Supple.  PULMONARY: +rhonchi, +wheezing CARDIOVASCULAR: S1 and S2. Regular rate and rhythm. No murmurs, rubs, or gallops.  GASTROINTESTINAL: Soft, nontender, -distended.  Positive bowel sounds.   MUSCULOSKELETAL: No swelling, clubbing, or edema.  NEUROLOGIC: obtunded, GCS<8 SKIN:intact,warm,dry  MEDICATIONS: I have reviewed all medications and confirmed regimen as documented   CULTURE RESULTS   Recent Results (from the past 240 hour(s))  Resp Panel by RT-PCR (Flu A&B, Covid) Nasopharyngeal Swab     Status: None   Collection Time: 06/11/20  9:32 PM   Specimen: Nasopharyngeal Swab; Nasopharyngeal(NP) swabs in vial transport medium  Result Value Ref Range Status   SARS Coronavirus 2 by RT PCR NEGATIVE NEGATIVE Final    Comment: (NOTE) SARS-CoV-2 target nucleic acids are NOT DETECTED.  The SARS-CoV-2 RNA is generally detectable in upper respiratory specimens during the acute phase of infection. The lowest concentration of SARS-CoV-2 viral copies this assay can detect is 138 copies/mL. A negative result does not preclude SARS-Cov-2 infection and should not be used as the sole basis for treatment or other patient management decisions. A negative result may occur with  improper specimen collection/handling, submission of specimen other than nasopharyngeal swab, presence of viral mutation(s) within the areas targeted by this assay, and  inadequate number of viral copies(<138 copies/mL). A negative result must be combined with clinical observations, patient history, and epidemiological information. The expected result is Negative.  Fact Sheet for Patients:   Fact Sheet for Healthcare Providers:  06/13/20  This test is no t yet approved or cleared by the [employer] FDA and  has been authorized for detection and/or diagnosis of SARS-CoV-2 by FDA under an Emergency Use Authorization (EUA). This EUA will remain  in effect (meaning this test can be used) for the duration of the COVID-19 declaration under Section 564(b)(1) of the Act, 21 U.S.C.section 360bbb-3(b)(1), unless the authorization is terminated  or revoked sooner.       Influenza A by PCR NEGATIVE NEGATIVE Final   Influenza B by PCR NEGATIVE NEGATIVE Final    Comment: (NOTE) The Xpert Xpress SARS-CoV-2/FLU/RSV plus assay is intended as an aid in the diagnosis of influenza from Nasopharyngeal swab specimens and should not be used as a sole basis for treatment. Nasal washings and aspirates are unacceptable for Xpert Xpress SARS-CoV-2/FLU/RSV testing.  Fact Sheet for Patients: [employer]it  Fact Sheet for Healthcare Providers: [place]  This test is not yet approved or cleared by the [employer]  FDA and has been authorized for detection and/or diagnosis of SARS-CoV-2 by FDA under an Emergency Use Authorization (EUA). This EUA will remain in effect (meaning this test can be used) for the duration of the COVID-19 declaration under Section 564(b)(1) of the Act, 21 U.S.C. section 360bbb-3(b)(1), unless the authorization is terminated or revoked.  Performed at [facility], [address] [place], [place] [address]   Blood culture (routine x 2)     Status: None (Preliminary result)   Collection Time:  06/12/20 12:17 AM   Specimen: BLOOD  Result Value Ref Range Status   Specimen Description BLOOD RIGHT FA  Final   Special Requests   Final    BOTTLES DRAWN AEROBIC AND ANAEROBIC Blood Culture results may not be optimal due to an inadequate volume of blood received in culture bottles   Culture   Final    NO GROWTH 3 DAYS Performed at [facility], [address] [place], [place] [address]    Report Status PENDING  Incomplete  Blood culture (routine x 2)     Status: None (Preliminary result)   Collection Time: 06/12/20 12:45 AM   Specimen: BLOOD  Result Value Ref Range Status   Specimen Description BLOOD RIGHT HAND  Final   Special Requests   Final    BOTTLES DRAWN AEROBIC AND ANAEROBIC Blood Culture results may not be optimal due to an inadequate volume of blood received in culture bottles   Culture   Final    NO GROWTH 3 DAYS Performed at [facility], [address], [place], [place] [address]    Report Status PENDING  Incomplete  MRSA PCR Screening     Status: None   Collection Time: 06/12/20  3:27 AM   Specimen: Nasopharyngeal  Result Value Ref Range Status   MRSA by PCR NEGATIVE NEGATIVE Final    Comment:        The GeneXpert MRSA Assay (FDA approved for NASAL specimens only), is one component of a comprehensive MRSA colonization surveillance program. It is not intended to diagnose MRSA infection nor to guide or monitor treatment for MRSA infections. Performed at [facility], [address] [place], [place] [address]   Respiratory (~20 pathogens) panel by PCR     Status: None   Collection Time: 06/14/20  5:30 PM   Specimen: Nasopharyngeal Swab; Respiratory  Result Value Ref Range Status   Adenovirus NOT DETECTED NOT DETECTED Final   Coronavirus 229E NOT DETECTED NOT DETECTED Final    Comment: (NOTE) The Coronavirus on the Respiratory Panel, DOES NOT test for the novel  Coronavirus (2019 nCoV)    Coronavirus HKU1 NOT DETECTED NOT  DETECTED Final   Coronavirus NL63 NOT DETECTED NOT DETECTED Final   Coronavirus OC43 NOT DETECTED NOT DETECTED Final   Metapneumovirus NOT DETECTED NOT DETECTED Final   Rhinovirus / Enterovirus NOT DETECTED NOT DETECTED Final   Influenza A NOT DETECTED NOT DETECTED Final   Influenza B NOT DETECTED NOT DETECTED Final   Parainfluenza Virus 1 NOT DETECTED NOT DETECTED Final   Parainfluenza Virus 2 NOT DETECTED NOT DETECTED Final   Parainfluenza Virus 3 NOT DETECTED NOT DETECTED Final   Parainfluenza Virus 4 NOT DETECTED NOT DETECTED Final   Respiratory Syncytial Virus NOT DETECTED NOT DETECTED Final   Bordetella pertussis NOT DETECTED NOT DETECTED Final   Bordetella Parapertussis NOT DETECTED NOT DETECTED Final   Chlamydophila pneumoniae NOT DETECTED NOT DETECTED Final   Mycoplasma pneumoniae NOT DETECTED NOT DETECTED  Final    Comment: Performed at [facility], [address] [place], [place] [address]  Culture, Respiratory w Gram Stain     Status: None (Preliminary result)   Collection Time: 06/14/20  5:46 PM   Specimen: Tracheal Aspirate; Respiratory  Result Value Ref Range Status   Specimen Description   Final    TRACHEAL ASPIRATE Performed at [facility], [address] [place], [place] [address]    Special Requests   Final    NONE Performed at [facility], [address] [place], [place] [address]    Gram Stain   Final    MODERATE WBC PRESENT,BOTH PMN AND MONONUCLEAR MODERATE GRAM NEGATIVE RODS FEW GRAM POSITIVE COCCI IN PAIRS IN CLUSTERS Performed at [facility], [address] [place], [place] [address]    Culture PENDING  Incomplete   Report Status PENDING  Incomplete          IMAGING    DG Chest 1 View  Result Date: 06/14/2020 CLINICAL DATA:  Endotracheal tube placement. EXAM: CHEST  1 VIEW COMPARISON:  June 11, 2020. FINDINGS: Stable cardiomegaly. Endotracheal and nasogastric tubes are unchanged in position. No  pneumothorax is noted. Mild bilateral patchy airspace opacities are noted concerning for multifocal pneumonia. Bony thorax is unremarkable. IMPRESSION: Stable support apparatus. Mild bilateral patchy airspace opacities are noted concerning for multifocal pneumonia. Electronically Signed   By: Lupita RaiderJames  Green Jr M.D.   On: 06/14/2020 14:28   US EKG SITE RITE  Result Date: 06/14/2020 If Site Rite image not attached, placement could not be confirmed due to current cardiac rhythm.       Indwelling Urinary Catheter continued, requirement due to   Reason to continue Indwelling Urinary Catheter strict Intake/Output monitoring for hemodynamic instability   Central Line/ continued, requirement due to  Reason to continue [employer] Line Monitoring of central venous pressure or other hemodynamic parameters and poor IV access   Ventilator continued, requirement due to severe respiratory failure   Ventilator Sedation RASS 0 to -2      ASSESSMENT AND PLAN SYNOPSIS 30 yo male admitted with left glenohumeral joint dislocation with greater tuberosity fracture pt s/p left shoulder reduction in ER; pancreatitis; and seizure activity secondary to severe ETOH withdrawal with progressive respiratory and aspiration pneumonia and renal failure requiring emergent intubate and MV support  GI Acute pancreatitis secondary to ETOH abuse   Nausea/vomiting     NEURO Acute encephalopathy secondary to severe ETOH withdrawal   Seizure activity secondary to ETOH withdrwal   Goal RASS -2 to -3  ASPIRATION PNEUMONIA Severe ACUTE Hypoxic and Hypercapnic Respiratory Failure -continue Full MV support -continue Bronchodilator Therapy -Wean Fio2 and PEEP as tolerated -VAP/VENT bundle implementation  ACUTE KIDNEY INJURY/Renal Failure -continue Foley Catheter-assess need -Avoid nephrotoxic agents -Follow urine output, BMP -Ensure adequate renal perfusion, optimize oxygenation -Renal dose  medications   CARDIAC ICU monitoring  ID -continue IV abx as prescibed -follow up cultures  GI SEVERE PANCREATITIS GI PROPHYLAXIS as indicated DIET-->will need to consider TF's as tolerated Constipation protocol as indicated Repeat CT abd pending Check LA  ENDO - will use ICU hypoglycemic\Hyperglycemia protocol if indicated    ELECTROLYTES -follow labs as needed -replace as needed -pharmacy consultation and following   DVT/GI PRX ordered and assessed TRANSFUSIONS AS NEEDED MONITOR FSBS I Assessed the need for Labs I Assessed the need for Foley I Assessed the need for Central Venous Line Family Discussion when available I Assessed the need  for Mobilization I made an Assessment of medications to be adjusted accordingly Safety Risk assessment completed   CASE DISCUSSED IN MULTIDISCIPLINARY ROUNDS WITH ICU TEAM  Critical Care Time devoted to patient care services described in this note is 65 minutes.   Overall, patient is critically ill, prognosis is guarded.  Patient with Multiorgan failure and at high risk for cardiac arrest and death.    Lucie Leather, M.D.  [facility]  Medical Director [facility] Medical Director [facility] Cardio-Pulmonary Department

## 2020-06-16 DIAGNOSIS — E872 Acidosis: Secondary | ICD-10-CM

## 2020-06-16 LAB — COMPREHENSIVE METABOLIC PANEL
ALT: 27 U/L (ref 0–44)
AST: 40 U/L (ref 15–41)
Albumin: 2 g/dL — ABNORMAL LOW (ref 3.5–5.0)
Alkaline Phosphatase: 91 U/L (ref 38–126)
Anion gap: 6 (ref 5–15)
BUN: 10 mg/dL (ref 6–20)
CO2: 23 mmol/L (ref 22–32)
Calcium: 8.3 mg/dL — ABNORMAL LOW (ref 8.9–10.3)
Chloride: 121 mmol/L — ABNORMAL HIGH (ref 98–111)
Creatinine, Ser: 1.58 mg/dL — ABNORMAL HIGH (ref 0.61–1.24)
GFR, Estimated: >60 mL/min (ref >60)
Glucose, Bld: 132 mg/dL — ABNORMAL HIGH (ref 70–99)
Potassium: 3.9 mmol/L (ref 3.5–5.1)
Sodium: 150 mmol/L — ABNORMAL HIGH (ref 135–145)
Total Bilirubin: 1.6 mg/dL — ABNORMAL HIGH (ref 0.3–1.2)
Total Protein: 6 g/dL — ABNORMAL LOW (ref 6.5–8.1)

## 2020-06-16 LAB — GLUCOSE, CAPILLARY
Glucose-Capillary: 127 mg/dL — ABNORMAL HIGH (ref 70–99)
Glucose-Capillary: 113 mg/dL — ABNORMAL HIGH (ref 70–99)
Glucose-Capillary: 111 mg/dL — ABNORMAL HIGH (ref 70–99)
Glucose-Capillary: 103 mg/dL — ABNORMAL HIGH (ref 70–99)
Glucose-Capillary: 118 mg/dL — ABNORMAL HIGH (ref 70–99)
Glucose-Capillary: 125 mg/dL — ABNORMAL HIGH (ref 70–99)

## 2020-06-16 LAB — AMYLASE, BODY FLUID (OTHER): Amylase, Body Fluid: 5062 U/L

## 2020-06-16 LAB — CBC
HCT: 35.6 % — ABNORMAL LOW (ref 39.0–52.0)
Hemoglobin: 10.5 g/dL — ABNORMAL LOW (ref 13.0–17.0)
MCH: 30.3 pg (ref 26.0–34.0)
MCHC: 29.5 g/dL — ABNORMAL LOW (ref 30.0–36.0)
MCV: 102.6 fL — ABNORMAL HIGH (ref 80.0–100.0)
Platelets: 298 10*3/uL (ref 150–400)
RBC: 3.47 MIL/uL — ABNORMAL LOW (ref 4.22–5.81)
RDW: 13.2 % (ref 11.5–15.5)
WBC: 9.8 10*3/uL (ref 4.0–10.5)
nRBC: 1 % — ABNORMAL HIGH (ref 0.0–0.2)

## 2020-06-16 LAB — LEGIONELLA PNEUMOPHILA SEROGP 1 UR AG: L. pneumophila Serogp 1 Ur Ag: NEGATIVE

## 2020-06-16 LAB — PHOSPHORUS: Phosphorus: 5.2 mg/dL — ABNORMAL HIGH (ref 2.5–4.6)

## 2020-06-16 LAB — MAGNESIUM: Magnesium: 2 mg/dL (ref 1.7–2.4)

## 2020-06-16 LAB — PROCALCITONIN: Procalcitonin: 0.45 ng/mL

## 2020-06-16 LAB — LIPASE, BLOOD: Lipase: 45 U/L (ref 11–51)

## 2020-06-16 LAB — AMYLASE: Amylase: 91 U/L (ref 28–100)

## 2020-06-16 MED ORDER — DEXTROSE 5 % IV SOLN: INTRAVENOUS | Status: DC

## 2020-06-16 NOTE — Progress Notes (Signed)
CRITICAL CARE NOTE  30 yo male admitted withleft glenohumeral joint dislocation with greater tuberosity fracture pt s/p left shoulder reduction in ER; pancreatitis; and seizure activity secondary to severe ETOH withdrawal   heavy drinker and stopped drinking 2 days prior to ER presentation. Per ER notes he had a witnessed tonic-clonic seizurethat lasted a few minutesat home prior toarrival to the ER  Left shoulder x-ray concerning for anterior inferior glenohumeral fracture dislocation. In the ER left shoulder reduction performed and left shoulder immobilizer placed  worsening ETOH withdrawal symptoms despite prn ativan requiring precedex gtt. Pt changed to ICU status and PCCM team consulted 03/1 to assist with management.  Increased WOB and SOB  Procedures:  Left Shoulder Reduction JP drain into abd 3/3  Significant Diagnostic Tests:  CT Abd Pelvis 02/27>>Findings consistent with acute interstitial pancreatitis. Correlation with lipase is recommended. Small volume intraperitoneal and retroperitoneal free fluid. Trace bilateral pleural effusions. Distended urinary bladder. CT Head Cervical Spine 02/27>>No acute intracranial abnormality. No acute fracture or malalignment of the spine. CT Shoulder Left 02/28>>Interval reduction of the anterior dislocation with the glenohumeral joint in near anatomic location. Comminuted slightly impacted Hill-Sachs deformity which involve the anterior bicipital groove.  Micro Data:  Respiratory Panel by RT-PCR 02/27>>negative  Blood x2 02/28>>negative    Consults:  02/28: Pt initially admitted to the progressive care unit, however status changed to ICU for closer monitoring 03/1: Pt with worsening ETOH withdrawal symptoms requiring precedex gtt. PCCM team consulted to assist with management  03/1: Orthopedic consulted for left glenohumeral joint dislocation with greater tuberosity fracture pt s/p left shoulder reduction in ER. Per  orthopedic surgery pt opting to proceed with non surgical management with the arm in a sling and non weightbearing of the left upper extremity  3/3 IR consulted   Significant Hospital Events:  02/28 severe ETOH withdrawal, severe pancreatitis 3/1 severe pancreatitis 3/2 increased WOB and SOB, severe DT's, patient emergently intubated 3/2 PICC line placed 3/3 remains intubated, JP drain placed into 9cm fluid collection by IR 3/3 IR placed dubhoff tube, started trickle feeds        CC  follow up respiratory failure  SUBJECTIVE Patient remains critically ill Prognosis is guarded  Vent Mode: PRVC FiO2 (%):  [35 %-40 %] 35 % Set Rate:  [15 bmp] 15 bmp Vt Set:  [500 mL] 500 mL PEEP:  [5 cmH20] 5 cmH20 CBC    Component Value Date/Time   WBC 9.8 06/16/2020 0503   RBC 3.47 (L) 06/16/2020 0503   HGB 10.5 (L) 06/16/2020 0503   HGB 14.9 08/18/2012 1154   HCT 35.6 (L) 06/16/2020 0503   HCT 44.8 08/18/2012 1154   PLT 298 06/16/2020 0503   PLT 349 08/18/2012 1154   MCV 102.6 (H) 06/16/2020 0503   MCV 89 08/18/2012 1154   MCH 30.3 06/16/2020 0503   MCHC 29.5 (L) 06/16/2020 0503   RDW 13.2 06/16/2020 0503   RDW 13.2 08/18/2012 1154   LYMPHSABS 0.6 (L) 06/11/2020 2132   MONOABS 1.0 06/11/2020 2132   EOSABS 0.0 06/11/2020 2132   BASOSABS 0.1 06/11/2020 2132   BMP Latest Ref Rng & Units 06/16/2020 06/15/2020 06/14/2020  Glucose 70 - 99 mg/dL 629(U) 765(Y) 650(P)  BUN 6 - 20 mg/dL 10 13 12   Creatinine 0.61 - 1.24 mg/dL ) 5.46(F) 6.81(E)  Sodium 135 - 145 mmol/L 150(H) 147(H) 145  Potassium 3.5 - 5.1 mmol/L 3.9 3.9 3.7  Chloride 98 - 111 mmol/L 121(H) 119(H) 117(H)  CO2 22 -  32 mmol/L 23 22 22   Calcium 8.9 - 10.3 mg/dL 8.3(L) 8.4(L) 8.4(L)    Intake/Output Summary (Last 24 hours) at 06/16/2020 0757 Last data filed at 06/16/2020 0500 Gross per 24 hour  Intake 4469.65 ml  Output 3475 ml  Net 994.65 ml    BP 115/66   Pulse 93   Temp 99.3 F (37.4 C) (Axillary)   Resp  15   Ht 5\' 5"  (1.651 m)   Wt 90 kg   SpO2 100%   BMI 33.02 kg/m    I/O last 3 completed shifts: In: 6925.9 [I.V.:6726; IV Piggyback:199.9] Out: 4520 [Urine:3950; Emesis/NG output:500; Drains:70] No intake/output data recorded.  SpO2: 100 % O2 Flow Rate (L/min): 40 L/min FiO2 (%): 35 %  Estimated body mass index is 33.02 kg/m as calculated from the following:   Height as of this encounter: 5\' 5"  (1.651 m).   Weight as of this encounter: 90 kg.  SIGNIFICANT EVENTS   REVIEW OF SYSTEMS  PATIENT IS UNABLE TO PROVIDE COMPLETE REVIEW OF SYSTEMS DUE TO SEVERE CRITICAL ILLNESS        PHYSICAL EXAMINATION:  GENERAL:critically ill appearing, +resp distress EYES: Pupils equal, round, reactive to light.  No scleral icterus.  MOUTH: Moist mucosal membrane. NECK: Supple.  PULMONARY: +rhonchi, +wheezing CARDIOVASCULAR: S1 and S2. Regular rate and rhythm. No murmurs, rubs, or gallops.  GASTROINTESTINAL: +distended.  Positive bowel sounds.   MUSCULOSKELETAL: No swelling, clubbing, or edema.  NEUROLOGIC: obtunded, GCS<8 SKIN:intact,warm,dry  MEDICATIONS: I have reviewed all medications and confirmed regimen as documented   CULTURE RESULTS   Recent Results (from the past 240 hour(s))  Resp Panel by RT-PCR (Flu A&B, Covid) Nasopharyngeal Swab     Status: None   Collection Time: 06/11/20  9:32 PM   Specimen: Nasopharyngeal Swab; Nasopharyngeal(NP) swabs in vial transport medium  Result Value Ref Range Status   SARS Coronavirus 2 by RT PCR NEGATIVE NEGATIVE Final    Comment: (NOTE) SARS-CoV-2 target nucleic acids are NOT DETECTED.  The SARS-CoV-2 RNA is generally detectable in upper respiratory specimens during the acute phase of infection. The lowest concentration of SARS-CoV-2 viral copies this assay can detect is 138 copies/mL. A negative result does not preclude SARS-Cov-2 infection and should not be used as the sole basis for treatment or other patient management  decisions. A negative result may occur with  improper specimen collection/handling, submission of specimen other than nasopharyngeal swab, presence of viral mutation(s) within the areas targeted by this assay, and inadequate number of viral copies(<138 copies/mL). A negative result must be combined with clinical observations, patient history, and epidemiological information. The expected result is Negative.  Fact Sheet for Patients:   Fact Sheet for Healthcare Providers:   This test is no t yet approved or cleared by the 06/13/20 FDA and  has been authorized for detection and/or diagnosis of SARS-CoV-2 by FDA under an Emergency Use Authorization (EUA). This EUA will remain  in effect (meaning this test can be used) for the duration of the COVID-19 declaration under Section 564(b)(1) of the Act, 21 U.S.C.section 360bbb-3(b)(1), unless the authorization is terminated  or revoked sooner.       Influenza A by PCR NEGATIVE NEGATIVE Final   Influenza B by PCR NEGATIVE NEGATIVE Final    Comment: (NOTE) The Xpert Xpress SARS-CoV-2/FLU/RSV plus assay is intended as an aid in the diagnosis of influenza from Nasopharyngeal swab specimens and should not be used as a sole basis for treatment. Nasal washings and  aspirates are unacceptable for Xpert Xpress SARS-CoV-2/FLU/RSV testing.  Fact Sheet for Patients: BloggerCourse.com  Fact Sheet for Healthcare Providers: SeriousBroker.it  This test is not yet approved or cleared by the [place] FDA and has been authorized for detection and/or diagnosis of SARS-CoV-2 by FDA under an Emergency Use Authorization (EUA). This EUA will remain in effect (meaning this test can be used) for the duration of the COVID-19 declaration under Section 564(b)(1) of the Act, 21 U.S.C. section 360bbb-3(b)(1), unless the  authorization is terminated or revoked.  Performed at [facility], [address] [place], [place] [address]   Blood culture (routine x 2)     Status: None (Preliminary result)   Collection Time: 06/12/20 12:17 AM   Specimen: BLOOD  Result Value Ref Range Status   Specimen Description BLOOD RIGHT FA  Final   Special Requests   Final    BOTTLES DRAWN AEROBIC AND ANAEROBIC Blood Culture results may not be optimal due to an inadequate volume of blood received in culture bottles   Culture   Final    NO GROWTH 3 DAYS Performed at [facility], [address], [place], [place] [address]    Report Status PENDING  Incomplete  Blood culture (routine x 2)     Status: None (Preliminary result)   Collection Time: 06/12/20 12:45 AM   Specimen: BLOOD  Result Value Ref Range Status   Specimen Description BLOOD RIGHT HAND  Final   Special Requests   Final    BOTTLES DRAWN AEROBIC AND ANAEROBIC Blood Culture results may not be optimal due to an inadequate volume of blood received in culture bottles   Culture   Final    NO GROWTH 3 DAYS Performed at [facility], [address], [place], [place] [address]    Report Status PENDING  Incomplete  MRSA PCR Screening     Status: None   Collection Time: 06/12/20  3:27 AM   Specimen: Nasopharyngeal  Result Value Ref Range Status   MRSA by PCR NEGATIVE NEGATIVE Final    Comment:        The GeneXpert MRSA Assay (FDA approved for NASAL specimens only), is one component of a comprehensive MRSA colonization surveillance program. It is not intended to diagnose MRSA infection nor to guide or monitor treatment for MRSA infections. Performed at [facility], [address] [place], [place] [address]   Respiratory (~20 pathogens) panel by PCR     Status: None   Collection Time: 06/14/20  5:30 PM   Specimen: Nasopharyngeal Swab; Respiratory  Result Value Ref Range Status   Adenovirus NOT DETECTED NOT DETECTED  Final   Coronavirus 229E NOT DETECTED NOT DETECTED Final    Comment: (NOTE) The Coronavirus on the Respiratory Panel, DOES NOT test for the novel  Coronavirus (2019 nCoV)    Coronavirus HKU1 NOT DETECTED NOT DETECTED Final   Coronavirus NL63 NOT DETECTED NOT DETECTED Final   Coronavirus OC43 NOT DETECTED NOT DETECTED Final   Metapneumovirus NOT DETECTED NOT DETECTED Final   Rhinovirus / Enterovirus NOT DETECTED NOT DETECTED Final   Influenza A NOT DETECTED NOT DETECTED Final   Influenza B NOT DETECTED NOT DETECTED Final   Parainfluenza Virus 1 NOT DETECTED NOT DETECTED Final   Parainfluenza Virus 2 NOT DETECTED NOT DETECTED Final   Parainfluenza Virus 3 NOT DETECTED NOT DETECTED Final   Parainfluenza Virus 4 NOT DETECTED NOT DETECTED Final   Respiratory Syncytial Virus NOT DETECTED NOT DETECTED Final  Bordetella pertussis NOT DETECTED NOT DETECTED Final   Bordetella Parapertussis NOT DETECTED NOT DETECTED Final   Chlamydophila pneumoniae NOT DETECTED NOT DETECTED Final   Mycoplasma pneumoniae NOT DETECTED NOT DETECTED Final    Comment: Performed at [facility], [address] [place], [place] [address]  Culture, Respiratory w Gram Stain     Status: None (Preliminary result)   Collection Time: 06/14/20  5:46 PM   Specimen: Tracheal Aspirate; Respiratory  Result Value Ref Range Status   Specimen Description   Final    TRACHEAL ASPIRATE Performed at [facility], [address] [place], [place] [address]    Special Requests   Final    NONE Performed at [facility], [address] [place], [place] [address]    Gram Stain   Final    MODERATE WBC PRESENT,BOTH PMN AND MONONUCLEAR MODERATE GRAM NEGATIVE RODS FEW GRAM POSITIVE COCCI IN PAIRS IN CLUSTERS Performed at [facility], [address], [place], [place] [address]    Culture PENDING  Incomplete   Report Status PENDING  Incomplete  Aerobic/Anaerobic Culture (surgical/deep wound)      Status: None (Preliminary result)   Collection Time: 06/15/20 12:29 PM   Specimen: Abscess  Result Value Ref Range Status   Specimen Description   Final    ABSCESS Performed at [facility], [address] [place], [place] [address]    Special Requests   Final    NONE Performed at [facility], [address], [place], [place] [address]    Gram Stain NO ORGANISMS SEEN  Final   Culture   Final    NO GROWTH < 24 HOURS Performed at [facility], [address] [place], [place] [address]    Report Status PENDING  Incomplete          IMAGING    DG Abd 1 View  Result Date: 06/15/2020 CLINICAL DATA:  Enteric catheter placement EXAM: ABDOMEN - 1 VIEW COMPARISON:  06/12/2020, 06/15/2020 FINDINGS: Frontal view of the lower chest and upper abdomen demonstrates weighted tip of an enteric feeding catheter along the expected course of the duodenum, tip in the region of the ligament of Treitz. A second enteric catheter tip and side port project over the gastric fundus. There is a pigtail drainage catheter within the central upper abdomen unchanged. Paucity of bowel gas, with oral contrast seen within the ascending colon. IMPRESSION: 1. Enteric catheters as above, with the weighted tip of a feeding catheter in the region of the duodenal-jejunal junction. Electronically Signed   By: Sharlet Salina M.D.   On: 06/15/2020 22:03   CT ABDOMEN PELVIS W CONTRAST  Result Date: 06/15/2020 CLINICAL DATA:  Abdominal distension.  Follow-up pancreatitis EXAM: CT ABDOMEN AND PELVIS WITH CONTRAST TECHNIQUE: Multidetector CT imaging of the abdomen and pelvis was performed using the standard protocol following bolus administration of intravenous contrast. CONTRAST:  OMNIPAQUE IOHEXOL 300 MG/ML  SOLN COMPARISON:  06/11/2020 FINDINGS: Lower chest: Small pleural effusions and bilateral lower lobe opacity with volume loss. The central line is seen into the upper right atrium. Hepatobiliary:  No focal liver abnormality.No evidence of biliary obstruction or stone. Pancreas: Acute pancreatitis with diffuse parenchymal and adjacent edema. Patchy poor enhancement the uncinate process suggesting mild necrosis. There is progressive inflammation posterior to the pancreas where the portal vein confluence is severely attenuated but patent without visible luminal thrombus. No pseudoaneurysm is seen. Inferior to the gastric body is a  9 cm low-density collection which is focal but does not show rim enhancement. Increased retroperitoneal edema. Small volume ascites. Spleen: Unremarkable. Adrenals/Urinary Tract: Negative adrenals. No hydronephrosis or stone. Unremarkable bladder. Stomach/Bowel:  No obstruction. No appendicitis. Vascular/Lymphatic: No acute vascular abnormality. No mass or adenopathy. Reproductive:No pathologic findings. Other: No pneumoperitoneum. Musculoskeletal: No acute abnormalities. IMPRESSION: 1. Acute edematous pancreatitis with progressive inflammation and possible mild necrosis at the uncinate. 2. 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 3. 9 cm non organized fluid collection at the gastrocolic ligament. 4. Lower lobe atelectasis and small pleural effusions, new. 5. Small volume ascites. Electronically Signed   By: Marnee Spring M.D.   On: 06/15/2020 10:05   DG Basil Dess Tube Plc W/Fl W/Rad  Result Date: 06/15/2020 CLINICAL DATA:  Dobbhoff tube placement needed. EXAM: NASO G TUBE PLACEMENT WITH FL AND WITH RAD CONTRAST:  None. FLUOROSCOPY TIME:  Fluoroscopy Time:  3 minutes 36 seconds Radiation Exposure Index (if provided by the fluoroscopic device): 146.5 mGy Number of Acquired Spot Images: 3 COMPARISON:  CT 06/15/2020. FINDINGS: Dobbhoff tube was placed under fluoroscopic guidance to the level of the distal duodenum. As requested NG tube removed. Drainage catheter noted over the left upper quadrant. IMPRESSION: Dobbhoff  tube was placed under fluoroscopic guidance to the level of the distal duodenum. Electronically Signed   By: Maisie Fus  Register   On: 06/15/2020 12:48   CT IMAGE GUIDED DRAINAGE BY PERCUTANEOUS CATHETER  Result Date: 06/15/2020 INDICATION: History of pancreatitis, now with indeterminate fluid collection anterior to the pancreatic bed. Please perform CT-guided drainage catheter placement for infection source control purposes. EXAM: CT IMAGE GUIDED DRAINAGE BY PERCUTANEOUS CATHETER COMPARISON:  CT abdomen pelvis-earlier same day; 06/12/2020 MEDICATIONS: The patient is currently admitted to the hospital and receiving intravenous antibiotics. The antibiotics were administered within an appropriate time frame prior to the initiation of the procedure. ANESTHESIA/SEDATION: Patient is currently intubated and sedated by ICU staff CONTRAST:  None COMPLICATIONS: None immediate. PROCEDURE: Informed written consent was obtained from the patient's mother after a discussion of the risks, benefits and alternatives to treatment. The patient was placed supine on the CT gantry and a pre procedural CT was performed re-demonstrating the known abscess/fluid collection within the ventral aspect of the abdomen with dominant component measuring approximately 7.8 x 5.9 cm (image 42, series 2). The procedure was planned. A timeout was performed prior to the initiation of the procedure. The skin overlying the ventral aspect the left upper abdomen was prepped and draped in the usual sterile fashion. The overlying soft tissues were anesthetized with 1% lidocaine with epinephrine. Appropriate trajectory was planned with the use of a 22 gauge spinal needle. An 18 gauge trocar needle was advanced into the abscess/fluid collection and a short Amplatz super stiff wire was coiled within the collection. Appropriate positioning was confirmed with a limited CT scan. The tract was serially dilated allowing placement of a 12 [place] all-purpose drainage  catheter. Appropriate positioning was confirmed with a limited postprocedural CT scan. Approximately 80 cc of bilious appearing fluid was aspirated. The tube was connected to a drainage bag and sutured in place. A dressing was placed. The patient tolerated the procedure well without immediate post procedural complication. IMPRESSION: Successful CT guided placement of a 94 French all purpose drain catheter into the indeterminate fluid collection/pseudocyst with the ventral aspect of the abdomen with aspiration of 80 mL of bilious appearing fluid. Samples were sent to the laboratory as requested  by the ordering clinical team. Note, given concern for pseudocyst and bilious appearance of aspirated fluid, the sample was also sent for amylase and lipase levels. Electronically Signed   By: Simonne ComeJohn  Watts M.D.   On: 06/15/2020 13:46     Nutrition Status: Nutrition Problem: Inadequate oral intake Etiology: inability to eat Signs/Symptoms: NPO status Interventions: Tube feeding,Prostat     Indwelling Urinary Catheter continued, requirement due to   Reason to continue Indwelling Urinary Catheter strict Intake/Output monitoring for hemodynamic instability   Central Line/ continued, requirement due to  Reason to continue [employer] Line Monitoring of central venous pressure or other hemodynamic parameters and poor IV access   Ventilator continued, requirement due to severe respiratory failure   Ventilator Sedation RASS 0 to -2      ASSESSMENT AND PLAN SYNOPSIS 30 yo male admitted withleft glenohumeral joint dislocation with greater tuberosity fracture pt s/p left shoulder reduction in ER; pancreatitis; and seizure activity secondary to severe ETOH withdrawal with progressive respiratory and aspiration pneumonia and renal failure requiring emergent intubate and MV support   ASPIRATION PNEUMONIA Severe ACUTE Hypoxic and Hypercapnic Respiratory Failure -continue Full MV support -continue Bronchodilator  Therapy -Wean Fio2 and PEEP as tolerated -VAP/VENT bundle implementation  obesity, possible OSA.   Will certainly impact respiratory mechanics, ventilator weaning Suspect will need to consider additional PEEP   ACUTE KIDNEY INJURY/Renal Failure -continue Foley Catheter-assess need -Avoid nephrotoxic agents -Follow urine output, BMP -Ensure adequate renal perfusion, optimize oxygenation -Renal dose medications   GI SEVERE PANCREATITIS complicated by 9CM  fluid collection +abd distention causing impairment of resp mechanics S/p IR drain placement 3/3 GI PROPHYLAXIS as indicated DIET-->TF's as tolerated Constipation protocol as indicated    NEURO Acute encephalopathy secondary to severe ETOH withdrawal  Seizure activity secondary to ETOH withdrwal  Goal RASS -2 to -3   CARDIAC ICU monitoring  ID -continue IV abx as prescibed -follow up cultures  GI GI PROPHYLAXIS as indicated  DIET-->TF's as tolerated Constipation protocol as indicated  ENDO - will use ICU hypoglycemic\Hyperglycemia protocol if indicated     ELECTROLYTES -follow labs as needed -replace as needed -pharmacy consultation and following   DVT/GI PRX ordered and assessed TRANSFUSIONS AS NEEDED MONITOR FSBS I Assessed the need for Labs I Assessed the need for Foley I Assessed the need for Central Venous Line Family Discussion when available I Assessed the need for Mobilization I made an Assessment of medications to be adjusted accordingly Safety Risk assessment completed   CASE DISCUSSED IN MULTIDISCIPLINARY ROUNDS WITH ICU TEAM  Critical Care Time devoted to patient care services described in this note is 57  minutes.   Overall, patient is critically ill, prognosis is guarded.  Patient with Multiorgan failure and at high risk for cardiac arrest and death.    Lucie LeatherKurian David Milta Croson, M.D.  [facility]  Medical Director [facility] Medical Director  [facility] Cardio-P[facility] Department

## 2020-06-16 NOTE — Consult Note (Signed)
[facility] CONSULT NOTE    Date: 06/16/2020                  Patient Name:  Marcus Small  MRN: [id number]  DOB: May 15, 1990  Age / Sex: 30 y.o., male         PCP: Patient, No Pcp Per                 Service Requesting Consult: Critical care                 Reason for Consult:  Acute kidney injury in the setting of acute pancreatitis            History of Present Illness: Patient is a 30 y.o. male with a PMHx of left glenohumeral joint dislocation with greater tuberosity fracture status post left shoulder reduction, hypertension, anxiety, alcohol abuse, depression, who was admitted to [facility] on 06/11/2020 for evaluation of seizures.  Patient had stopped drinking alcohol 2 days prior to admission.  He had a witnessed tonic-clonic seizure that lasted several minutes at home prior to coming to the emergency department.  No prior history of seizures.  He had a CT scan of the abdomen pelvis which showed acute interstitial pancreatitis.  He also had left shoulder x-ray concerning for anterior-inferior glenohumeral fracture dislocation.  We are now asked to see him for evaluation management of acute kidney injury.  Upon initial presentation his creatinine was found to be 1.55.  It did come down to 1.1 but rose to 1.97 yesterday.  Today creatinine down to 1.58.  He also appears to have hyponatremia with serum sodium of 150.  Lipase is trending down and currently 45.  He is intubated and sedated at the moment   Medications: Outpatient medications: Medications Prior to Admission  Medication Sig Dispense Refill Last Dose  . Aspirin-Acetaminophen-Caffeine (GOODY HEADACHE PO) Take 1 packet by mouth as needed.   prn at prn  . ibuprofen (ADVIL) 200 MG tablet Take 200 mg by mouth every 6 (six) hours as needed for headache.   prn at prn  . albuterol (VENTOLIN HFA) 108 (90 Base) MCG/ACT inhaler Inhale 2 puffs into the lungs every 6 (six) hours as needed for wheezing or shortness of  breath. (Patient not taking: No sig reported) 8 g 0 Not Taking at Unknown time  . ketorolac (TORADOL) 10 MG tablet Take 1 tablet (10 mg total) by mouth every 8 (eight) hours as needed. (Patient not taking: No sig reported) 20 tablet 0 Not Taking at Unknown time  . meloxicam (MOBIC) 15 MG tablet Take 1 tablet (15 mg total) by mouth daily. (Patient not taking: No sig reported) 30 tablet 0 Not Taking at Unknown time  . ondansetron (ZOFRAN ODT) 4 MG disintegrating tablet Take 1 tablet (4 mg total) by mouth every 8 (eight) hours as needed. (Patient not taking: No sig reported) 20 tablet 0 Not Taking at Unknown time    Current medications: Current Facility-Administered Medications  Medication Dose Route Frequency Provider Last Rate Last Admin  . acetaminophen (TYLENOL) suppository 650 mg  650 mg Rectal Q6H PRN Awilda Bill, NP      . albuterol (VENTOLIN HFA) 108 (90 Base) MCG/ACT inhaler 2 puff  2 puff Inhalation Q6H PRN Marcelyn Bruins, MD      . chlorhexidine gluconate (MEDLINE KIT) (PERIDEX) 0.12 % solution 15 mL  15 mL Mouth Rinse BID Flora Lipps, MD   15 mL at 06/16/20 0902  .  Chlorhexidine Gluconate Cloth 2 % PADS 6 each  6 each Topical Daily Marcelyn Bruins, MD   6 each at 06/15/20 1512  . dexmedetomidine (PRECEDEX) 400 MCG/100ML (4 mcg/mL) infusion  0.4-1.2 mcg/kg/hr Intravenous Titrated Shawna Clamp, MD 27 mL/hr at 06/16/20 0924 1.2 mcg/kg/hr at 06/16/20 0924  . dextrose 5 %-0.9 % sodium chloride infusion   Intravenous Continuous Shawna Clamp, MD 150 mL/hr at 06/16/20 0800 Infusion Verify at 06/16/20 0800  . docusate (COLACE) 50 MG/5ML liquid 100 mg  100 mg Per Tube BID Darel Hong D, NP   100 mg at 06/15/20 2215  . enoxaparin (LOVENOX) injection 45 mg  0.5 mg/kg Subcutaneous Q24H Marcelyn Bruins, MD   45 mg at 06/14/20 0930  . feeding supplement (PROSource TF) liquid 45 mL  45 mL Per Tube TID Flora Lipps, MD   45 mL at 06/15/20 2216  . feeding supplement (VITAL 1.5  CAL) liquid 1,000 mL  1,000 mL Per Tube Continuous Flora Lipps, MD 55 mL/hr at 06/15/20 2226 1,000 mL at 06/15/20 2226  . fentaNYL (SUBLIMAZE) bolus via infusion 50 mcg  50 mcg Intravenous Q15 min PRN Bradly Bienenstock, NP   50 mcg at 06/14/20 1841  . fentaNYL (SUBLIMAZE) injection 50 mcg  50 mcg Intravenous Once Darel Hong D, NP      . fentaNYL 2511mg in NS 253m(1035mml) infusion-PREMIX  50-200 mcg/hr Intravenous Continuous KeeDarel Hong NP 20 mL/hr at 06/16/20 0800 200 mcg/hr at 06/16/20 0800  . folic acid injection 1 mg  1 mg Intravenous Daily AmeNolberto HanlonD   1 mg at 06/14/20 1544  . HYDROmorphone (DILAUDID) injection 1 mg  1 mg Intravenous Q4H PRN KumShawna ClampD   1 mg at 06/13/20 1558  . lactated ringers infusion   Intravenous Continuous MorSharion SettlerP   Held at 06/13/20 1900  . MEDLINE mouth rinse  15 mL Mouth Rinse 10 times per day KasFlora LippsD   15 mL at 06/16/20 0615  . metoprolol tartrate (LOPRESSOR) injection 7.5 mg  7.5 mg Intravenous Q6H End, Christopher, MD   7.5 mg at 06/15/20 1513  . midazolam (VERSED) 50 mg/50 mL (1 mg/mL) premix infusion  0-10 mg/hr Intravenous Continuous KeeDarel Hong NP 6 mL/hr at 06/16/20 0800 6 mg/hr at 06/16/20 0800  . midazolam (VERSED) bolus via infusion 1-2 mg  1-2 mg Intravenous Q2H PRN KeeBradly BienenstockP   2 mg at 06/15/20 2337  . multivitamin with minerals tablet 1 tablet  1 tablet Oral Daily MelMarcelyn BruinsD   1 tablet at 06/12/20 [id number] [phone] ondansetron (ZOFRAN) injection 4 mg  4 mg Intravenous Q6H PRN KumShawna ClampD   4 mg at 06/12/20 1321  . pantoprazole (PROTONIX) injection 40 mg  40 mg Intravenous Q24H BlaAwilda BillP   40 mg at 06/15/20 2038  . piperacillin-tazobactam (ZOSYN) IVPB 3.375 g  3.375 g Intravenous Q8H Kasa, Kurian, MD 12.5 mL/hr at 06/16/20 0800 Infusion Verify at 06/16/20 0800  . polyethylene glycol (MIRALAX / GLYCOLAX) packet 17 g  17 g Per Tube Daily KeeDarel Hong NP      .  sodium chloride flush (NS) 0.9 % injection 10-40 mL  10-40 mL Intracatheter Q12H AmeNolberto HanlonD   10 mL at 06/15/20 2103  . sodium chloride flush (NS) 0.9 % injection 10-40 mL  10-40 mL Intracatheter PRN AmeNolberto HanlonD   10 mL at 06/15/20 1101  . sodium chloride flush (NS)  0.9 % injection 3 mL  3 mL Intravenous Q12H Marcelyn Bruins, MD   3 mL at 06/15/20 2103  . sodium chloride flush (NS) 0.9 % injection 5 mL  5 mL Intracatheter Q8H Sandi Mariscal, MD   5 mL at 06/16/20 [phone]  . thiamine tablet 100 mg  100 mg Oral Daily Marcelyn Bruins, MD       Or  . thiamine (B-1) injection 100 mg  100 mg Intravenous Daily Marcelyn Bruins, MD   100 mg at 06/14/20 0930      Allergies: Allergies  Allergen Reactions  . Peanut-Containing Drug Products Itching  . Shellfish Allergy       Past Medical History: Past Medical History:  Diagnosis Date  . Anxiety   . Hypertension      Past Surgical History: History reviewed. No pertinent surgical history.   Family History: Family History  Problem Relation Age of Onset  . Hypertension Mother      Social History: Social History   Socioeconomic History  . Marital status: Single    Spouse name: Not on file  . Number of children: Not on file  . Years of education: Not on file  . Highest education level: Not on file  Occupational History  . Not on file  Tobacco Use  . Smoking status: Current Every Day Smoker    Packs/day: 0.50    Types: Cigarettes    Last attempt to quit: 04/17/2015    Years since quitting: 5.1  . Smokeless tobacco: Never Used  Substance and Sexual Activity  . Alcohol use: Yes    Alcohol/week: 18.0 standard drinks    Types: 18 Cans of beer per week    Comment: daily - beer plus 2 pints of liquor a day  . Drug use: Not on file  . Sexual activity: Not on file  Other Topics Concern  . Not on file  Social History Narrative  . Not on file   Social Determinants of Health   Financial Resource Strain: Not on  file  Food Insecurity: Not on file  Transportation Needs: Not on file  Physical Activity: Not on file  Stress: Not on file  Social Connections: Not on file  Intimate Partner Violence: Not on file     Review of Systems: Patient cannot provide review of systems as he is on the ventilator  Vital Signs: Blood pressure 133/83, pulse 88, temperature 99.3 F (37.4 C), temperature source Axillary, resp. rate 15, height _0  (1.651 m), weight 90 kg, SpO2 100 %.  Weight trends: Filed Weights   06/11/20 2124  Weight: 90 kg    Physical Exam: Physical Exam: General:  Critically ill-appearing  Head:  Normocephalic, atraumatic.  Endotracheal tube in place  Eyes:  Anicteric  Neck:  Supple  Lungs:   Scattered rhonchi, vent assisted  Heart:  S1S2 no rubs  Abdomen:   Distention noted, scant bowel sounds  Extremities:  1+ peripheral edema.  Neurologic:  Intubated, sedated  Skin:  Multiple tattoos noted  Access:  No hemodialysis access present at the moment    Lab results: Basic Metabolic Panel: Recent Labs  Lab 06/11/20 2132 06/12/20 0017 06/13/20 0717 06/14/20 0719 06/15/20 0523 06/16/20 0503  NA 138   < > 142 145 147* 150*  K 2.8*   < > 3.8 3.7 3.9 3.9  CL 102   < > 112* 117* 119* 121*  CO2 19*   < > 19* _1 GLUCOSE 148*   < >  75 109* 110* 132*  BUN 9   < > _0 CREATININE 1.55*   < > 1.14 1.51* 1.97* 1.58*  CALCIUM 9.2   < > 8.4* 8.4* 8.4* 8.3*  MG 2.2  --  2.2 2.4 2.1 2.0  PHOS  --    < > 3.4 2.6 4.0 5.2*   < > = values in this interval not displayed.    Liver Function Tests: Recent Labs  Lab 06/14/20 0719 06/15/20 0523 06/16/20 0503  AST 76* 73* 40  ALT _1 ALKPHOS 75 73 91  BILITOT 2.2* 2.1* 1.6*  PROT 6.0* 6.3* 6.0*  ALBUMIN 2.5* 2.3* 2.0*   Recent Labs  Lab 06/14/20 0719 06/15/20 0523 06/16/20 0503  LIPASE 64* 52* 45  AMYLASE 217* 142* 91   No results for input(s): AMMONIA in the last 168 hours.  CBC: Recent Labs  Lab  06/11/20 2132 06/12/20 0545 06/13/20 0717 06/14/20 0719 06/15/20 0523 06/16/20 0503  WBC 23.5* 20.2* 12.8* 9.9 9.5 9.8  NEUTROABS 21.7*  --   --   --   --   --   HGB 15.0 14.4 12.2* 11.2* 10.5* 10.5*  HCT 45.6 41.9 37.9* 35.7* 34.2* 35.6*  MCV 91.9 89.9 95.0 96.0 100.6* 102.6*  PLT 359 277 238 222 255 298    Cardiac Enzymes: No results for input(s): CKTOTAL, CKMB, CKMBINDEX, TROPONINI in the last 168 hours.  BNP: Invalid input(s): POCBNP  CBG: Recent Labs  Lab 06/15/20 1609 06/15/20 1929 06/15/20 2316 06/16/20 0301 06/16/20 0721  GLUCAP 102* 110* 108* 127* 113*    Microbiology: Results for orders placed or performed during the hospital encounter of 06/11/20  Resp Panel by RT-PCR (Flu A&B, Covid) Nasopharyngeal Swab     Status: None   Collection Time: 06/11/20  9:32 PM   Specimen: Nasopharyngeal Swab; Nasopharyngeal(NP) swabs in vial transport medium  Result Value Ref Range Status   SARS Coronavirus 2 by RT PCR NEGATIVE NEGATIVE Final    Comment: (NOTE) SARS-CoV-2 target nucleic acids are NOT DETECTED.  The SARS-CoV-2 RNA is generally detectable in upper respiratory specimens during the acute phase of infection. The lowest concentration of SARS-CoV-2 viral copies this assay can detect is 138 copies/mL. A negative result does not preclude SARS-Cov-2 infection and should not be used as the sole basis for treatment or other patient management decisions. A negative result may occur with  improper specimen collection/handling, submission of specimen other than nasopharyngeal swab, presence of viral mutation(s) within the areas targeted by this assay, and inadequate number of viral copies(<138 copies/mL). A negative result must be combined with clinical observations, patient history, and epidemiological information. The expected result is Negative.  Fact Sheet for Patients:  EntrepreneurPulse.com.au  Fact Sheet for Healthcare Providers:   IncredibleEmployment.be  This test is no t yet approved or cleared by the [place] FDA and  has been authorized for detection and/or diagnosis of SARS-CoV-2 by FDA under an Emergency Use Authorization (EUA). This EUA will remain  in effect (meaning this test can be used) for the duration of the COVID-19 declaration under Section 564(b)(1) of the Act, 21 U.S.C.section 360bbb-3(b)(1), unless the authorization is terminated  or revoked sooner.       Influenza A by PCR NEGATIVE NEGATIVE Final   Influenza B by PCR NEGATIVE NEGATIVE Final    Comment: (NOTE) The Xpert Xpress SARS-CoV-2/FLU/RSV plus assay is intended as an aid in the diagnosis of influenza from Nasopharyngeal swab specimens and should  not be used as a sole basis for treatment. Nasal washings and aspirates are unacceptable for Xpert Xpress SARS-CoV-2/FLU/RSV testing.  Fact Sheet for Patients: EntrepreneurPulse.com.au  Fact Sheet for Healthcare Providers: IncredibleEmployment.be  This test is not yet approved or cleared by the [place] FDA and has been authorized for detection and/or diagnosis of SARS-CoV-2 by FDA under an Emergency Use Authorization (EUA). This EUA will remain in effect (meaning this test can be used) for the duration of the COVID-19 declaration under Section 564(b)(1) of the Act, 21 U.S.C. section 360bbb-3(b)(1), unless the authorization is terminated or revoked.  Performed at [facility], [address] [place], [place] [address]   Blood culture (routine x 2)     Status: None (Preliminary result)   Collection Time: 06/12/20 12:17 AM   Specimen: BLOOD  Result Value Ref Range Status   Specimen Description BLOOD RIGHT FA  Final   Special Requests   Final    BOTTLES DRAWN AEROBIC AND ANAEROBIC Blood Culture results may not be optimal due to an inadequate volume of blood received in culture bottles   Culture   Final    NO  GROWTH 4 DAYS Performed at [facility], [address], [place], [place] [address]    Report Status PENDING  Incomplete  Blood culture (routine x 2)     Status: None (Preliminary result)   Collection Time: 06/12/20 12:45 AM   Specimen: BLOOD  Result Value Ref Range Status   Specimen Description BLOOD RIGHT HAND  Final   Special Requests   Final    BOTTLES DRAWN AEROBIC AND ANAEROBIC Blood Culture results may not be optimal due to an inadequate volume of blood received in culture bottles   Culture   Final    NO GROWTH 4 DAYS Performed at [facility], [address] [place], [place] [address]    Report Status PENDING  Incomplete  MRSA PCR Screening     Status: None   Collection Time: 06/12/20  3:27 AM   Specimen: Nasopharyngeal  Result Value Ref Range Status   MRSA by PCR NEGATIVE NEGATIVE Final    Comment:        The GeneXpert MRSA Assay (FDA approved for NASAL specimens only), is one component of a comprehensive MRSA colonization surveillance program. It is not intended to diagnose MRSA infection nor to guide or monitor treatment for MRSA infections. Performed at [facility], [place], [place] [address]   Respiratory (~20 pathogens) panel by PCR     Status: None   Collection Time: 06/14/20  5:30 PM   Specimen: Nasopharyngeal Swab; Respiratory  Result Value Ref Range Status   Adenovirus NOT DETECTED NOT DETECTED Final   Coronavirus 229E NOT DETECTED NOT DETECTED Final    Comment: (NOTE) The Coronavirus on the Respiratory Panel, DOES NOT test for the novel  Coronavirus (2019 nCoV)    Coronavirus HKU1 NOT DETECTED NOT DETECTED Final   Coronavirus NL63 NOT DETECTED NOT DETECTED Final   Coronavirus OC43 NOT DETECTED NOT DETECTED Final   Metapneumovirus NOT DETECTED NOT DETECTED Final   Rhinovirus / Enterovirus NOT DETECTED NOT DETECTED Final   Influenza A NOT DETECTED NOT DETECTED Final   Influenza B NOT DETECTED NOT  DETECTED Final   Parainfluenza Virus 1 NOT DETECTED NOT DETECTED Final   Parainfluenza Virus 2 NOT DETECTED NOT DETECTED Final   Parainfluenza Virus 3 NOT DETECTED NOT DETECTED Final   Parainfluenza Virus 4 NOT DETECTED NOT DETECTED  Final   Respiratory Syncytial Virus NOT DETECTED NOT DETECTED Final   Bordetella pertussis NOT DETECTED NOT DETECTED Final   Bordetella Parapertussis NOT DETECTED NOT DETECTED Final   Chlamydophila pneumoniae NOT DETECTED NOT DETECTED Final   Mycoplasma pneumoniae NOT DETECTED NOT DETECTED Final    Comment: Performed at [facility], [place] [address], [place], [place] [address]  Culture, Respiratory w Gram Stain     Status: None (Preliminary result)   Collection Time: 06/14/20  5:46 PM   Specimen: Tracheal Aspirate; Respiratory  Result Value Ref Range Status   Specimen Description   Final    TRACHEAL ASPIRATE Performed at [facility], [address] [place], [place] [address]    Special Requests   Final    NONE Performed at [facility], [address] [place], [place] [address]    Gram Stain   Final    MODERATE WBC PRESENT,BOTH PMN AND MONONUCLEAR MODERATE GRAM NEGATIVE RODS FEW GRAM POSITIVE COCCI IN PAIRS IN CLUSTERS Performed at [facility], [place] [address], [place], [place] [address]    Culture PENDING  Incomplete   Report Status PENDING  Incomplete  Aerobic/Anaerobic Culture (surgical/deep wound)     Status: None (Preliminary result)   Collection Time: 06/15/20 12:29 PM   Specimen: Abscess  Result Value Ref Range Status   Specimen Description   Final    ABSCESS Performed at [facility], [address], [place], [place] [address]    Special Requests   Final    NONE Performed at [facility], [address], [place], [place] [address]    Gram Stain NO ORGANISMS SEEN  Final   Culture   Final    NO GROWTH < 24 HOURS Performed at [facility], [place] [address], [place], [place]  [address]    Report Status PENDING  Incomplete    Coagulation Studies: No results for input(s): LABPROT, INR in the last 72 hours.  Urinalysis: No results for input(s): COLORURINE, LABSPEC, PHURINE, GLUCOSEU, HGBUR, BILIRUBINUR, KETONESUR, PROTEINUR, UROBILINOGEN, NITRITE, LEUKOCYTESUR in the last 72 hours.  Invalid input(s): APPERANCEUR    Imaging: DG Chest 1 View  Result Date: 06/14/2020 CLINICAL DATA:  Endotracheal tube placement. EXAM: CHEST  1 VIEW COMPARISON:  June 11, 2020. FINDINGS: Stable cardiomegaly. Endotracheal and nasogastric tubes are unchanged in position. No pneumothorax is noted. Mild bilateral patchy airspace opacities are noted concerning for multifocal pneumonia. Bony thorax is unremarkable. IMPRESSION: Stable support apparatus. Mild bilateral patchy airspace opacities are noted concerning for multifocal pneumonia. Electronically Signed   By: Marijo Conception M.D.   On: 06/14/2020 14:28   DG Abd 1 View  Result Date: 06/15/2020 CLINICAL DATA:  Enteric catheter placement EXAM: ABDOMEN - 1 VIEW COMPARISON:  06/12/2020, 06/15/2020 FINDINGS: Frontal view of the lower chest and upper abdomen demonstrates weighted tip of an enteric feeding catheter along the expected course of the duodenum, tip in the region of the ligament of Treitz. A second enteric catheter tip and side port project over the gastric fundus. There is a pigtail drainage catheter within the central upper abdomen unchanged. Paucity of bowel gas, with oral contrast seen within the ascending colon. IMPRESSION: 1. Enteric catheters as above, with the weighted tip of a feeding catheter in the region of the duodenal-jejunal junction. Electronically Signed   By: Randa Ngo M.D.   On: 06/15/2020 22:03   CT ABDOMEN PELVIS W CONTRAST  Result Date: 06/15/2020 CLINICAL DATA:  Abdominal distension.  Follow-up pancreatitis EXAM: CT ABDOMEN AND PELVIS WITH CONTRAST TECHNIQUE: Multidetector CT imaging of the abdomen and pelvis  was performed using the standard protocol following bolus administration of intravenous contrast. CONTRAST:  17m OMNIPAQUE IOHEXOL 300 MG/ML  SOLN COMPARISON:  06/11/2020 FINDINGS: Lower chest: Small pleural effusions and bilateral lower lobe opacity with volume loss. The central line is seen into the upper right atrium. Hepatobiliary: No focal liver abnormality.No evidence of biliary obstruction or stone. Pancreas: Acute pancreatitis with diffuse parenchymal and adjacent edema. Patchy poor enhancement the uncinate process suggesting mild necrosis. There is progressive inflammation posterior to the pancreas where the portal vein confluence is severely attenuated but patent without visible luminal thrombus. No pseudoaneurysm is seen. Inferior to the gastric body is a 9 cm low-density collection which is focal but does not show rim enhancement. Increased retroperitoneal edema. Small volume ascites. Spleen: Unremarkable. Adrenals/Urinary Tract: Negative adrenals. No hydronephrosis or stone. Unremarkable bladder. Stomach/Bowel:  No obstruction. No appendicitis. Vascular/Lymphatic: No acute vascular abnormality. No mass or adenopathy. Reproductive:No pathologic findings. Other: No pneumoperitoneum. Musculoskeletal: No acute abnormalities. IMPRESSION: 1. Acute edematous pancreatitis with progressive inflammation and possible mild necrosis at the uncinate. 2. 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 3. 9 cm non organized fluid collection at the gastrocolic ligament. 4. Lower lobe atelectasis and small pleural effusions, new. 5. Small volume ascites. Electronically Signed   By: JMonte FantasiaM.D.   On: 06/15/2020 10:05   DG NLoyce DysTube Plc W/Fl W/Rad  Result Date: 06/15/2020 CLINICAL DATA:  Dobbhoff tube placement needed. EXAM: NASO G TUBE PLACEMENT WITH FL AND WITH RAD CONTRAST:  None. FLUOROSCOPY TIME:  Fluoroscopy Time:  3 minutes 36 seconds  Radiation Exposure Index (if provided by the fluoroscopic device): 146.5 mGy Number of Acquired Spot Images: 3 COMPARISON:  CT 06/15/2020. FINDINGS: Dobbhoff tube was placed under fluoroscopic guidance to the level of the distal duodenum. As requested NG tube removed. Drainage catheter noted over the left upper quadrant. IMPRESSION: Dobbhoff tube was placed under fluoroscopic guidance to the level of the distal duodenum. Electronically Signed   By: TMarcello Moores Register   On: 06/15/2020 12:48   CT IMAGE GUIDED DRAINAGE BY PERCUTANEOUS CATHETER  Result Date: 06/15/2020 INDICATION: History of pancreatitis, now with indeterminate fluid collection anterior to the pancreatic bed. Please perform CT-guided drainage catheter placement for infection source control purposes. EXAM: CT IMAGE GUIDED DRAINAGE BY PERCUTANEOUS CATHETER COMPARISON:  CT abdomen pelvis-earlier same day; 06/12/2020 MEDICATIONS: The patient is currently admitted to the hospital and receiving intravenous antibiotics. The antibiotics were administered within an appropriate time frame prior to the initiation of the procedure. ANESTHESIA/SEDATION: Patient is currently intubated and sedated by ICU staff CONTRAST:  None COMPLICATIONS: None immediate. PROCEDURE: Informed written consent was obtained from the patient's mother after a discussion of the risks, benefits and alternatives to treatment. The patient was placed supine on the CT gantry and a pre procedural CT was performed re-demonstrating the known abscess/fluid collection within the ventral aspect of the abdomen with dominant component measuring approximately 7.8 x 5.9 cm (image 42, series 2). The procedure was planned. A timeout was performed prior to the initiation of the procedure. The skin overlying the ventral aspect the left upper abdomen was prepped and draped in the usual sterile fashion. The overlying soft tissues were anesthetized with 1% lidocaine with epinephrine. Appropriate trajectory was  planned with the use of a 22 gauge spinal needle. An 148  gauge trocar needle was advanced into the abscess/fluid collection and a short Amplatz super stiff wire was coiled within the collection. Appropriate positioning was confirmed with a limited CT scan. The tract was serially dilated allowing placement of a 12 [place] all-purpose drainage catheter. Appropriate positioning was confirmed with a limited postprocedural CT scan. Approximately 80 cc of bilious appearing fluid was aspirated. The tube was connected to a drainage bag and sutured in place. A dressing was placed. The patient tolerated the procedure well without immediate post procedural complication. IMPRESSION: Successful CT guided placement of a 35 French all purpose drain catheter into the indeterminate fluid collection/pseudocyst with the ventral aspect of the abdomen with aspiration of 80 mL of bilious appearing fluid. Samples were sent to the laboratory as requested by the ordering clinical team. Note, given concern for pseudocyst and bilious appearance of aspirated fluid, the sample was also sent for amylase and lipase levels. Electronically Signed   By: Sandi Mariscal M.D.   On: 06/15/2020 13:46   [place] EKG SITE RITE  Result Date: 06/14/2020 If Site Rite image not attached, placement could not be confirmed due to current cardiac rhythm.     Assessment & Plan: Pt is a 30 y.o. male with a PMHx of left glenohumeral joint dislocation with greater tuberosity fracture status post left shoulder reduction, hypertension, anxiety, alcohol abuse, depression, who was admitted to [facility] on 06/11/2020 for evaluation of seizure and found to have severe pancreatitis.  1.  Acute kidney injury secondary to acute tubular necrosis.  Creatinine was 1.5 upon admission.  It did peak at 1.97.  Now down to 1.58.  Appears to have ATN due to severe underlying pancreatitis and contrast exposure.  No immediate need for dialysis at the moment.  Continue to monitor renal  parameters and avoid nephrotoxins as possible.    2.  Hypernatremia.  DC D5 normal saline in favor of D5W at 150 cc/h.  3.  Acute respiratory failure.  Patient maintained on ventilatory support.  4.  Severe acute pancreatitis.  Continue supportive care as per pulmonary/critical care.

## 2020-06-16 NOTE — TOC Progression Note (Signed)
Transition of Care [facility]) - Progression Note    Patient Details  Name: Marcus Small MRN: [id number] Date of Birth: 1990-05-02  Transition of Care [facility]) CM/SW Contact  Marina Goodell Phone Number:  [phone] 06/16/2020, 12:16 PM  Clinical Narrative:     Patient is sedated and intubated.  Drain placed on 06/15/2020.  TOC will continue to follow for disposition.       Expected Discharge Plan and Services                                                 Social Determinants of Health (SDOH) Interventions    Readmission Risk Interventions No flowsheet data found.

## 2020-06-16 NOTE — TOC Initial Note (Signed)
Transition of Care [facility]) - Initial/Assessment Note    Patient Details  Name: Marcus Small MRN: [id number] Date of Birth: 1990-04-28  Transition [facility] Care [facility]) CM/SW Contact:    Marina Goodell Phone Number: [phone] 06/16/2020, 12:42 PM  Clinical Narrative:                  Patient presents to [facility] due to seizure.  Patient stated he drinks alcohol regularly.  CSW contacted patient's Marcus Small (Mother) [phone] for collateral information. CSW explained role of TOC in patient care. Ms. Lawerance Cruel stated the patient is able to perform all ADLs, but he has not been feeling well for a few days.  Ms. Lawerance Cruel stated the patient lives with his brother Marcus Small and she knows he drinks alcohol regularly but does not know how much he consumes.  CSW spoke about different discharge options and how the lack of insurance may affect those options.  CSW stated I would continue to follow the patient and I would reach out to Ms. Lawerance Cruel once there was an estimated discharge time.  CSW encouraged Ms. Starr to contact me with any questions.  Ms. Lawerance Cruel verbalized understanding.  Expected Discharge Plan: Home w Home Health Services Barriers to Discharge: Inadequate or no insurance,Financial Resources,Continued Medical Work up   Patient Goals and CMS Choice        Expected Discharge Plan and Services Expected Discharge Plan: Home w Home Health Services In-house Referral: Clinical Social Work   Post Acute Care Choice: Home Health Living arrangements for the past 2 months: Single Family Home                                      Prior Living Arrangements/Services Living arrangements for the past 2 months: Single Family Home Lives with:: Siblings Patient language and need for interpreter reviewed:: Yes        Need for Family Participation in Patient Care: Yes (Comment) Care giver support system in place?: Yes (comment)   Criminal Activity/Legal Involvement Pertinent to Current  Situation/Hospitalization: No - Comment as needed  Activities of Daily Living Home Assistive Devices/Equipment: None ADL Screening (condition at time of admission) Patient's cognitive ability adequate to safely complete daily activities?: No Is the patient deaf or have difficulty hearing?: No Does the patient have difficulty seeing, even when wearing glasses/contacts?: No Does the patient have difficulty concentrating, remembering, or making decisions?: No Patient able to express need for assistance with ADLs?: No Does the patient have difficulty dressing or bathing?: No Independently performs ADLs?: Yes (appropriate for developmental age) Does the patient have difficulty walking or climbing stairs?: No Weakness of Legs: None Weakness of Arms/Hands: None  Permission Sought/Granted Permission sought to share information with : Family Supports    Share Information with NAME: Marcus Small (Mother)   [phone]           Emotional Assessment Appearance:: Appears older than stated age Attitude/Demeanor/Rapport: Unable to Assess Affect (typically observed): Unable to Assess Orientation: :  (Patient currently sedated/intubated) Alcohol / Substance Use: Alcohol Use Psych Involvement: Yes (comment)  Admission diagnosis:  Seizure (HCC) [R56.9] Seizures (HCC) [R56.9] Alcohol withdrawal seizure (HCC) [Q82.500, R56.9] Shoulder dislocation, left, initial encounter [S43.005A] Alcohol withdrawal syndrome without complication (HCC) [F10.230] Acute pancreatitis, unspecified complication status, unspecified pancreatitis type [K85.90] Sepsis, due to unspecified organism, unspecified whether acute organ dysfunction present [facility]) [A41.9] Patient Active Problem List  Diagnosis Date Noted  . Delirium tremens (HCC) 06/14/2020  . Acute pancreatitis   . Alcohol withdrawal seizure (HCC) 06/12/2020  . HTN (hypertension) 06/12/2020  . Alcohol withdrawal (HCC) 06/12/2020  . Alcohol use 06/12/2020   . Alcoholic pancreatitis 06/12/2020  . Leukocytosis 06/12/2020  . AKI (acute kidney injury) (HCC) 06/12/2020  . Hypokalemia 06/12/2020  . High anion gap metabolic acidosis 06/12/2020  . Closed dislocation of left shoulder 06/12/2020  . Elevated troponin 06/12/2020  . Tachycardia 06/12/2020   PCP:  Patient, No Pcp Per Pharmacy:   [facility] [address] ([address]), [place] - [address] [place]) [place] [address] Phone: [phone] Fax: [phone]     Social Determinants of Health (SDOH) Interventions    Readmission Risk Interventions No flowsheet data found.

## 2020-06-16 NOTE — Progress Notes (Signed)
Report received at 1500. Mitts on patient. Patient very aggressive and fights when turned or startled. Output great.

## 2020-06-17 ENCOUNTER — Inpatient Hospital Stay: Payer: Self-pay

## 2020-06-17 DIAGNOSIS — Z7289 Other problems related to lifestyle: Secondary | ICD-10-CM

## 2020-06-17 LAB — AMYLASE: Amylase: 71 U/L (ref 28–100)

## 2020-06-17 LAB — CULTURE, BLOOD (ROUTINE X 2)
Culture: NO GROWTH
Culture: NO GROWTH

## 2020-06-17 LAB — COMPREHENSIVE METABOLIC PANEL
ALT: 22 U/L (ref 0–44)
AST: 28 U/L (ref 15–41)
Albumin: 2 g/dL — ABNORMAL LOW (ref 3.5–5.0)
Alkaline Phosphatase: 91 U/L (ref 38–126)
Anion gap: 6 (ref 5–15)
BUN: 9 mg/dL (ref 6–20)
CO2: 25 mmol/L (ref 22–32)
Calcium: 8.4 mg/dL — ABNORMAL LOW (ref 8.9–10.3)
Chloride: 117 mmol/L — ABNORMAL HIGH (ref 98–111)
Creatinine, Ser: 1.62 mg/dL — ABNORMAL HIGH (ref 0.61–1.24)
GFR, Estimated: 59 mL/min — ABNORMAL LOW (ref >60)
Glucose, Bld: 147 mg/dL — ABNORMAL HIGH (ref 70–99)
Potassium: 3.4 mmol/L — ABNORMAL LOW (ref 3.5–5.1)
Sodium: 148 mmol/L — ABNORMAL HIGH (ref 135–145)
Total Bilirubin: 1.4 mg/dL — ABNORMAL HIGH (ref 0.3–1.2)
Total Protein: 6 g/dL — ABNORMAL LOW (ref 6.5–8.1)

## 2020-06-17 LAB — MAGNESIUM: Magnesium: 1.8 mg/dL (ref 1.7–2.4)

## 2020-06-17 LAB — CULTURE, RESPIRATORY W GRAM STAIN: Culture: NORMAL

## 2020-06-17 LAB — GLUCOSE, CAPILLARY
Glucose-Capillary: 107 mg/dL — ABNORMAL HIGH (ref 70–99)
Glucose-Capillary: 117 mg/dL — ABNORMAL HIGH (ref 70–99)
Glucose-Capillary: 137 mg/dL — ABNORMAL HIGH (ref 70–99)
Glucose-Capillary: 139 mg/dL — ABNORMAL HIGH (ref 70–99)
Glucose-Capillary: 144 mg/dL — ABNORMAL HIGH (ref 70–99)
Glucose-Capillary: 157 mg/dL — ABNORMAL HIGH (ref 70–99)
Glucose-Capillary: 164 mg/dL — ABNORMAL HIGH (ref 70–99)

## 2020-06-17 LAB — CBC
HCT: 34 % — ABNORMAL LOW (ref 39.0–52.0)
Hemoglobin: 10.4 g/dL — ABNORMAL LOW (ref 13.0–17.0)
MCH: 30.8 pg (ref 26.0–34.0)
MCHC: 30.6 g/dL (ref 30.0–36.0)
MCV: 100.6 fL — ABNORMAL HIGH (ref 80.0–100.0)
Platelets: 382 10*3/uL (ref 150–400)
RBC: 3.38 MIL/uL — ABNORMAL LOW (ref 4.22–5.81)
RDW: 13.2 % (ref 11.5–15.5)
WBC: 11 10*3/uL — ABNORMAL HIGH (ref 4.0–10.5)
nRBC: 0.5 % — ABNORMAL HIGH (ref 0.0–0.2)

## 2020-06-17 LAB — PHOSPHORUS: Phosphorus: 4.9 mg/dL — ABNORMAL HIGH (ref 2.5–4.6)

## 2020-06-17 LAB — LIPASE, BLOOD: Lipase: 39 U/L (ref 11–51)

## 2020-06-17 MED ORDER — MAGNESIUM SULFATE 2 GM/50ML IV SOLN
2.0000 g | Freq: Once | INTRAVENOUS | Status: AC
  Administered 2020-06-17: 2 g via INTRAVENOUS
  Filled 2020-06-17: qty 50

## 2020-06-17 MED ORDER — POTASSIUM CHLORIDE 20 MEQ PO PACK
40.0000 meq | PACK | Freq: Once | ORAL | Status: AC
  Administered 2020-06-17: 40 meq
  Filled 2020-06-17: qty 2

## 2020-06-17 NOTE — Progress Notes (Signed)
ETT advanced 3cm per MD request per CXR results. ETT now @ 26 at lips

## 2020-06-17 NOTE — Plan of Care (Signed)
  Problem: Education: Goal: Knowledge of General Education information will improve Description: Including pain rating scale, medication(s)/side effects and non-pharmacologic comfort measures Outcome: Not Progressing: Pt is sedated unable to teach but family was updated and plan of care   Problem: Health Behavior/Discharge Planning: Goal: Ability to manage health-related needs will improve Outcome: Not Progressing   Problem: Activity: Goal: Risk for activity intolerance will decrease Outcome: Not Progressing: Pt is very easily agitated with any activity.   Problem: Elimination: Goal: Will not experience complications related to bowel motility Outcome: Not Progressing: Pt was given laxative w/o any result. NGT clamped because Feed tube being suction out.  Pt is still on full vent support, on Fentanyl gtt at , Precedex gtt at 1.56mcq, versed gtt at , Tmax 99.5 today. Adequate urine output in the last 12 hours.

## 2020-06-17 NOTE — Progress Notes (Signed)
[facility]  ROUNDING NOTE   Subjective:   UOP 2817m Creatinine 1.62 (1.58) D5W at 154mhr   Objective:  Vital signs in last 24 hours:  Temp:  [98.2 F (36.8 C)-101.2 F (38.4 C)] 100.6 F (38.1 C) (03/05 0600) Pulse Rate:  [88-97] 93 (03/05 0800) Resp:  [12-17] 14 (03/05 0800) BP: (104-134)/(46-78) 106/75 (03/05 0800) SpO2:  [92 %-100 %] 99 % (03/05 0800) FiO2 (%):  [21 %-28 %] 28 % (03/05 0800) Weight:  [95.1 kg] 95.1 kg (03/05 0323)  Weight change:  Filed Weights   06/11/20 2124 06/17/20 0323  Weight: 90 kg 95.1 kg    Intake/Output: I/O last 3 completed shifts: In: 7801.8 [I.V.:5854.9; Other:5; NG/GT:1736.2; IV Piggyback:205.7] Out: 633428Urine:4205; Emesis/NG output:2050; Drains:100]   Intake/Output this shift:  Total I/O In: 1470.6 [P.O.:800; I.V.:639.8; IV Piggyback:30.8] Out: 250 [Urine:250]  Physical Exam: General: Critically ill  Head: ETT   Eyes: PERRL  Neck: trachea midline  Lungs:  PRVC 28%  Heart: Regular rate and rhythm  Abdomen:  Soft  Extremities:  no peripheral edema.  Neurologic: Intubated and sedated  Skin: No lesions  Access: none    Basic Metabolic Panel: Recent Labs  Lab 06/13/20 0717 06/14/20 0719 06/15/20 0523 06/16/20 0503 06/17/20 0450  NA 142 145 147* 150* 148*  K 3.8 3.7 3.9 3.9 3.4*  CL 112* 117* 119* 121* 117*  CO2 19* 22 22 23 25   GLUCOSE 75 109* 110* 132* 147*  BUN 9 12 13 10 9   CREATININE 1.14 1.51* 1.97* 1.58* 1.62*  CALCIUM 8.4* 8.4* 8.4* 8.3* 8.4*  MG 2.2 2.4 2.1 2.0 1.8  PHOS 3.4 2.6 4.0 5.2* 4.9*    Liver Function Tests: Recent Labs  Lab 06/12/20 0545 06/14/20 0719 06/15/20 0523 06/16/20 0503 06/17/20 0450  AST 61* 76* 73* 40 28  ALT 18 27 28 27 22   ALKPHOS 72 75 73 91 91  BILITOT 2.2* 2.2* 2.1* 1.6* 1.4*  PROT 6.6 6.0* 6.3* 6.0* 6.0*  ALBUMIN 3.4* 2.5* 2.3* 2.0* 2.0*   Recent Labs  Lab 06/13/20 0717 06/14/20 0719 06/15/20 0523 06/16/20 0503 06/17/20 0450  LIPASE 108*  64* 52* 45 39  AMYLASE  --  217* 142* 91 71   No results for input(s): AMMONIA in the last 168 hours.  CBC: Recent Labs  Lab 06/11/20 2132 06/12/20 0545 06/13/20 0717 06/14/20 0719 06/15/20 0523 06/16/20 0503 06/17/20 0450  WBC 23.5*   < > 12.8* 9.9 9.5 9.8 11.0*  NEUTROABS 21.7*  --   --   --   --   --   --   HGB 15.0   < > 12.2* 11.2* 10.5* 10.5* 10.4*  HCT 45.6   < > 37.9* 35.7* 34.2* 35.6* 34.0*  MCV 91.9   < > 95.0 96.0 100.6* 102.6* 100.6*  PLT 359   < > 238 222 255 298 382   < > = values in this interval not displayed.    Cardiac Enzymes: No results for input(s): CKTOTAL, CKMB, CKMBINDEX, TROPONINI in the last 168 hours.  BNP: Invalid input(s): POCBNP  CBG: Recent Labs  Lab 06/16/20 1534 06/16/20 1933 06/16/20 2314 06/17/20 0313 06/17/20 0736  GLUCAP 103* 118* 125* 139* 144*    Microbiology: Results for orders placed or performed during the hospital encounter of 06/11/20  Resp Panel by RT-PCR (Flu A&B, Covid) Nasopharyngeal Swab     Status: None   Collection Time: 06/11/20  9:32 PM   Specimen: Nasopharyngeal Swab; Nasopharyngeal(NP) swabs in  vial transport medium  Result Value Ref Range Status   SARS Coronavirus 2 by RT PCR NEGATIVE NEGATIVE Final    Comment: (NOTE) SARS-CoV-2 target nucleic acids are NOT DETECTED.  The SARS-CoV-2 RNA is generally detectable in upper respiratory specimens during the acute phase of infection. The lowest concentration of SARS-CoV-2 viral copies this assay can detect is 138 copies/mL. A negative result does not preclude SARS-Cov-2 infection and should not be used as the sole basis for treatment or other patient management decisions. A negative result may occur with  improper specimen collection/handling, submission of specimen other than nasopharyngeal swab, presence of viral mutation(s) within the areas targeted by this assay, and inadequate number of viral copies(<138 copies/mL). A negative result must be combined  with clinical observations, patient history, and epidemiological information. The expected result is Negative.  Fact Sheet for Patients:  EntrepreneurPulse.com.au  Fact Sheet for Healthcare Providers:  IncredibleEmployment.be  This test is no t yet approved or cleared by the [place] FDA and  has been authorized for detection and/or diagnosis of SARS-CoV-2 by FDA under an Emergency Use Authorization (EUA). This EUA will remain  in effect (meaning this test can be used) for the duration of the COVID-19 declaration under Section 564(b)(1) of the Act, 21 U.S.C.section 360bbb-3(b)(1), unless the authorization is terminated  or revoked sooner.       Influenza A by PCR NEGATIVE NEGATIVE Final   Influenza B by PCR NEGATIVE NEGATIVE Final    Comment: (NOTE) The Xpert Xpress SARS-CoV-2/FLU/RSV plus assay is intended as an aid in the diagnosis of influenza from Nasopharyngeal swab specimens and should not be used as a sole basis for treatment. Nasal washings and aspirates are unacceptable for Xpert Xpress SARS-CoV-2/FLU/RSV testing.  Fact Sheet for Patients: EntrepreneurPulse.com.au  Fact Sheet for Healthcare Providers: IncredibleEmployment.be  This test is not yet approved or cleared by the [place] FDA and has been authorized for detection and/or diagnosis of SARS-CoV-2 by FDA under an Emergency Use Authorization (EUA). This EUA will remain in effect (meaning this test can be used) for the duration of the COVID-19 declaration under Section 564(b)(1) of the Act, 21 U.S.C. section 360bbb-3(b)(1), unless the authorization is terminated or revoked.  Performed at [facility], [address] [place], [place] [address]   Blood culture (routine x 2)     Status: None   Collection Time: 06/12/20 12:17 AM   Specimen: BLOOD  Result Value Ref Range Status   Specimen Description BLOOD RIGHT FA   Final   Special Requests   Final    BOTTLES DRAWN AEROBIC AND ANAEROBIC Blood Culture results may not be optimal due to an inadequate volume of blood received in culture bottles   Culture   Final    NO GROWTH 5 DAYS Performed at [facility], [address] [place], [place] [address]    Report Status 06/17/2020 FINAL  Final  Blood culture (routine x 2)     Status: None   Collection Time: 06/12/20 12:45 AM   Specimen: BLOOD  Result Value Ref Range Status   Specimen Description BLOOD RIGHT HAND  Final   Special Requests   Final    BOTTLES DRAWN AEROBIC AND ANAEROBIC Blood Culture results may not be optimal due to an inadequate volume of blood received in culture bottles   Culture   Final    NO GROWTH 5 DAYS Performed at [facility], [address], [place], [place] [address]    Report Status  06/17/2020 FINAL  Final  MRSA PCR Screening     Status: None   Collection Time: 06/12/20  3:27 AM   Specimen: Nasopharyngeal  Result Value Ref Range Status   MRSA by PCR NEGATIVE NEGATIVE Final    Comment:        The GeneXpert MRSA Assay (FDA approved for NASAL specimens only), is one component of a comprehensive MRSA colonization surveillance program. It is not intended to diagnose MRSA infection nor to guide or monitor treatment for MRSA infections. Performed at [facility], [place], [place] [address]   Respiratory (~20 pathogens) panel by PCR     Status: None   Collection Time: 06/14/20  5:30 PM   Specimen: Nasopharyngeal Swab; Respiratory  Result Value Ref Range Status   Adenovirus NOT DETECTED NOT DETECTED Final   Coronavirus 229E NOT DETECTED NOT DETECTED Final    Comment: (NOTE) The Coronavirus on the Respiratory Panel, DOES NOT test for the novel  Coronavirus (2019 nCoV)    Coronavirus HKU1 NOT DETECTED NOT DETECTED Final   Coronavirus NL63 NOT DETECTED NOT DETECTED Final   Coronavirus OC43 NOT DETECTED NOT DETECTED Final    Metapneumovirus NOT DETECTED NOT DETECTED Final   Rhinovirus / Enterovirus NOT DETECTED NOT DETECTED Final   Influenza A NOT DETECTED NOT DETECTED Final   Influenza B NOT DETECTED NOT DETECTED Final   Parainfluenza Virus 1 NOT DETECTED NOT DETECTED Final   Parainfluenza Virus 2 NOT DETECTED NOT DETECTED Final   Parainfluenza Virus 3 NOT DETECTED NOT DETECTED Final   Parainfluenza Virus 4 NOT DETECTED NOT DETECTED Final   Respiratory Syncytial Virus NOT DETECTED NOT DETECTED Final   Bordetella pertussis NOT DETECTED NOT DETECTED Final   Bordetella Parapertussis NOT DETECTED NOT DETECTED Final   Chlamydophila pneumoniae NOT DETECTED NOT DETECTED Final   Mycoplasma pneumoniae NOT DETECTED NOT DETECTED Final    Comment: Performed at [facility], [place]. [address] [place], [place] [address]  Culture, Respiratory w Gram Stain     Status: None (Preliminary result)   Collection Time: 06/14/20  5:46 PM   Specimen: Tracheal Aspirate; Respiratory  Result Value Ref Range Status   Specimen Description   Final    TRACHEAL ASPIRATE Performed at [facility], [address] [place], [place] [address]    Special Requests   Final    NONE Performed at [facility], [address] [place], [place] [address]    Gram Stain   Final    MODERATE WBC PRESENT,BOTH PMN AND MONONUCLEAR MODERATE GRAM NEGATIVE RODS FEW GRAM POSITIVE COCCI IN PAIRS IN CLUSTERS    Culture   Final    CULTURE REINCUBATED FOR BETTER GROWTH Performed at [facility], [place] [address] [place], [place] [address]    Report Status PENDING  Incomplete  Aerobic/Anaerobic Culture (surgical/deep wound)     Status: None (Preliminary result)   Collection Time: 06/15/20 12:29 PM   Specimen: Abscess  Result Value Ref Range Status   Specimen Description   Final    ABSCESS Performed at [facility], [address] [place], [place] [address]    Special Requests   Final    NONE Performed at  [facility], [place], [place] [address]    Gram Stain NO ORGANISMS SEEN  Final   Culture   Final    NO GROWTH < 24 HOURS Performed at [facility], [place],  [place] [address]    Report Status PENDING  Incomplete    Coagulation Studies: No results for input(s): LABPROT, INR in the last 72 hours.  Urinalysis: No results for input(s): COLORURINE, LABSPEC, PHURINE, GLUCOSEU, HGBUR, BILIRUBINUR, KETONESUR, PROTEINUR, UROBILINOGEN, NITRITE, LEUKOCYTESUR in the last 72 hours.  Invalid input(s): APPERANCEUR    Imaging: DG Abd 1 View  Result Date: 06/15/2020 CLINICAL DATA:  Enteric catheter placement EXAM: ABDOMEN - 1 VIEW COMPARISON:  06/12/2020, 06/15/2020 FINDINGS: Frontal view of the lower chest and upper abdomen demonstrates weighted tip of an enteric feeding catheter along the expected course of the duodenum, tip in the region of the ligament of Treitz. A second enteric catheter tip and side port project over the gastric fundus. There is a pigtail drainage catheter within the central upper abdomen unchanged. Paucity of bowel gas, with oral contrast seen within the ascending colon. IMPRESSION: 1. Enteric catheters as above, with the weighted tip of a feeding catheter in the region of the duodenal-jejunal junction. Electronically Signed   By: Randa Ngo M.D.   On: 06/15/2020 22:03   DG Chest Port 1 View  Result Date: 06/17/2020 CLINICAL DATA:  Respiratory failure. EXAM: PORTABLE CHEST 1 VIEW COMPARISON:  June 14, 2020 FINDINGS: The ET tube terminates at the thoracic inlet, 7.5 cm above the carina. It has been pulled back a couple cm in the interval. The NG and feeding tubes prior terminate below today's film. A right PICC line is stable. No pneumothorax. The left lung is clear. The cardiomediastinal silhouette is stable. Mild opacity in the right base is more pronounced in the interval. IMPRESSION: 1. The ETT terminates at the thoracic inlet.  Recommend advancing 3 cm. 2. Other support apparatus as above. 3. Mild opacity in the right base is more pronounced in the interval and could represent atelectasis or developing infiltrate. Recommend attention on follow-up. Findings discussed with Myra Flowers Electronically Signed   By: Dorise Bullion III M.D   On: 06/17/2020 08:48   DG Loyce Dys Tube Plc W/Fl W/Rad  Result Date: 06/15/2020 CLINICAL DATA:  Dobbhoff tube placement needed. EXAM: NASO G TUBE PLACEMENT WITH FL AND WITH RAD CONTRAST:  None. FLUOROSCOPY TIME:  Fluoroscopy Time:  3 minutes 36 seconds Radiation Exposure Index (if provided by the fluoroscopic device): 146.5 mGy Number of Acquired Spot Images: 3 COMPARISON:  CT 06/15/2020. FINDINGS: Dobbhoff tube was placed under fluoroscopic guidance to the level of the distal duodenum. As requested NG tube removed. Drainage catheter noted over the left upper quadrant. IMPRESSION: Dobbhoff tube was placed under fluoroscopic guidance to the level of the distal duodenum. Electronically Signed   By: Marcello Moores  Register   On: 06/15/2020 12:48   CT IMAGE GUIDED DRAINAGE BY PERCUTANEOUS CATHETER  Result Date: 06/15/2020 INDICATION: History of pancreatitis, now with indeterminate fluid collection anterior to the pancreatic bed. Please perform CT-guided drainage catheter placement for infection source control purposes. EXAM: CT IMAGE GUIDED DRAINAGE BY PERCUTANEOUS CATHETER COMPARISON:  CT abdomen pelvis-earlier same day; 06/12/2020 MEDICATIONS: The patient is currently admitted to the hospital and receiving intravenous antibiotics. The antibiotics were administered within an appropriate time frame prior to the initiation of the procedure. ANESTHESIA/SEDATION: Patient is currently intubated and sedated by ICU staff CONTRAST:  None COMPLICATIONS: None immediate. PROCEDURE: Informed written consent was obtained from the patient's mother after a discussion of the risks, benefits and alternatives to treatment. The  patient was placed supine on the CT gantry and a pre procedural CT was performed re-demonstrating the known  abscess/fluid collection within the ventral aspect of the abdomen with dominant component measuring approximately 7.8 x 5.9 cm (image 42, series 2). The procedure was planned. A timeout was performed prior to the initiation of the procedure. The skin overlying the ventral aspect the left upper abdomen was prepped and draped in the usual sterile fashion. The overlying soft tissues were anesthetized with 1% lidocaine with epinephrine. Appropriate trajectory was planned with the use of a 22 gauge spinal needle. An 18 gauge trocar needle was advanced into the abscess/fluid collection and a short Amplatz super stiff wire was coiled within the collection. Appropriate positioning was confirmed with a limited CT scan. The tract was serially dilated allowing placement of a 12 [place] all-purpose drainage catheter. Appropriate positioning was confirmed with a limited postprocedural CT scan. Approximately 80 cc of bilious appearing fluid was aspirated. The tube was connected to a drainage bag and sutured in place. A dressing was placed. The patient tolerated the procedure well without immediate post procedural complication. IMPRESSION: Successful CT guided placement of a 95 French all purpose drain catheter into the indeterminate fluid collection/pseudocyst with the ventral aspect of the abdomen with aspiration of 80 mL of bilious appearing fluid. Samples were sent to the laboratory as requested by the ordering clinical team. Note, given concern for pseudocyst and bilious appearance of aspirated fluid, the sample was also sent for amylase and lipase levels. Electronically Signed   By: Sandi Mariscal M.D.   On: 06/15/2020 13:46     Medications:   . dexmedetomidine (PRECEDEX) IV infusion 1.2 mcg/kg/hr (06/17/20 0842)  . dextrose 150 mL/hr at 06/17/20 0809  . feeding supplement (VITAL 1.5 CAL) 1,000 mL (06/15/20 2226)   . fentaNYL infusion INTRAVENOUS 200 mcg/hr (06/17/20 0809)  . midazolam 8 mg/hr (06/17/20 0809)  . piperacillin-tazobactam (ZOSYN)  IV 12.5 mL/hr at 06/17/20 0809   . chlorhexidine gluconate (MEDLINE KIT)  15 mL Mouth Rinse BID  . Chlorhexidine Gluconate Cloth  6 each Topical Daily  . docusate  100 mg Per Tube BID  . enoxaparin (LOVENOX) injection  0.5 mg/kg Subcutaneous Q24H  . feeding supplement (PROSource TF)  45 mL Per Tube TID  . fentaNYL (SUBLIMAZE) injection  50 mcg Intravenous Once  . folic acid  1 mg Intravenous Daily  . mouth rinse  15 mL Mouth Rinse 10 times per day  . metoprolol tartrate  7.5 mg Intravenous Q6H  . multivitamin with minerals  1 tablet Oral Daily  . pantoprazole (PROTONIX) IV  40 mg Intravenous Q24H  . polyethylene glycol  17 g Per Tube Daily  . sodium chloride flush  10-40 mL Intracatheter Q12H  . sodium chloride flush  3 mL Intravenous Q12H  . sodium chloride flush  5 mL Intracatheter Q8H  . thiamine  100 mg Oral Daily   Or  . thiamine  100 mg Intravenous Daily   acetaminophen, albuterol, fentaNYL, midazolam, ondansetron (ZOFRAN) IV, sodium chloride flush  Assessment/ Plan:  Marcus Small is a 30 y.o. black male with history of left glenohumeral joint dislocation with greater tuberosity fracture status post left shoulder reduction, hypertension, anxiety, alcohol abuse, depression, who was admitted to [facility] on 06/11/2020 for evaluation of seizure and found to have severe pancreatitis.  1.  Acute kidney injury secondary to acute tubular necrosis.  Creatinine was 1.5 upon admission.  It did peak at 1.97.  Now down to 1.58.  Appears to have ATN due to severe underlying pancreatitis and IV contrast exposure on 06/15/20.  No indication for dialysis.   2.  Hypernatremia. Free water deficity of 3.3 liters - Continue D5W at 150 cc/h.  3.  Acute respiratory failure: requiring intubation and mechanical ventilation  4. Anemia with renal failure:  hemoglobin 10.4, macrocytic.    LOS: 5 Sarath Kolluru 3/5/20229:57 AM

## 2020-06-17 NOTE — Progress Notes (Signed)
CRITICAL CARE NOTE 30 yo male admitted withleft glenohumeral joint dislocation with greater tuberosity fracture pt s/p left shoulder reduction in ER; pancreatitis; and seizure activity secondary to severe ETOH withdrawal   heavy drinker and stopped drinking 2 days prior to ER presentation. Per ER notes he had a witnessed tonic-clonic seizurethat lasted a few minutesat home prior toarrival to the ER  Left shoulder x-ray concerning for anterior inferior glenohumeral fracture dislocation. In the ER left shoulder reduction performed and left shoulder immobilizer placed  worsening ETOH withdrawal symptoms despite prn ativan requiring precedex gtt. Pt changed to ICU status and PCCM team consulted 03/1 to assist with management. Increased WOB and SOB  Procedures:  Left Shoulder Reduction JP drain into abd 3/3  Significant Diagnostic Tests:  CT Abd Pelvis 02/27>>Findings consistent with acute interstitial pancreatitis. Correlation with lipase is recommended. Small volume intraperitoneal and retroperitoneal free fluid. Trace bilateral pleural effusions. Distended urinary bladder. CT Head Cervical Spine 02/27>>No acute intracranial abnormality. No acute fracture or malalignment of the spine. CT Shoulder Left 02/28>>Interval reduction of the anterior dislocation with the glenohumeral joint in near anatomic location. Comminuted slightly impacted Hill-Sachs deformity which involve the anterior bicipital groove.  Micro Data:  Respiratory Panel by RT-PCR 02/27>>negative  Blood x2 02/28>>negative    Consults:  02/28: Pt initially admitted to the progressive care unit, however status changed to ICU for closer monitoring 03/1: Pt with worsening ETOH withdrawal symptoms requiring precedex gtt. PCCM team consulted to assist with management  03/1: Orthopedic consulted for left glenohumeral joint dislocation with greater tuberosity fracture pt s/p left shoulder reduction in ER. Per  orthopedic surgery pt opting to proceed with non surgical management with the arm in a sling and non weightbearing of the left upper extremity  3/3 IR consulted   Significant Hospital Events:  02/28severe ETOH withdrawal, severe pancreatitis 3/1 severe pancreatitis 3/2 increased WOB and SOB, severe DT's, patient emergently intubated 3/2 PICC line placed 3/3 remains intubated, JP drain placed into 9cm fluid collection by IR 3/3 IR placed dubhoff tube, started trickle feeds 3/4 remains on vent, severe pancreatitis       CC  follow up respiratory failure  SUBJECTIVE Patient remains critically ill Prognosis is guarded +distended abd  Vent Mode: PRVC FiO2 (%):  [21 %-30 %] 28 % Set Rate:  [15 bmp] 15 bmp Vt Set:  [500 mL] 500 mL PEEP:  [5 cmH20] 5 cmH20 Plateau Pressure:  [28 cmH20] 28 cmH20 CBC    Component Value Date/Time   WBC 11.0 (H) 06/17/2020 0450   RBC 3.38 (L) 06/17/2020 0450   HGB 10.4 (L) 06/17/2020 0450   HGB 14.9 08/18/2012 1154   HCT 34.0 (L) 06/17/2020 0450   HCT 44.8 08/18/2012 1154   PLT 382 06/17/2020 0450   PLT 349 08/18/2012 1154   MCV 100.6 (H) 06/17/2020 0450   MCV 89 08/18/2012 1154   MCH 30.8 06/17/2020 0450   MCHC 30.6 06/17/2020 0450   RDW 13.2 06/17/2020 0450   RDW 13.2 08/18/2012 1154   LYMPHSABS 0.6 (L) 06/11/2020 2132   MONOABS 1.0 06/11/2020 2132   EOSABS 0.0 06/11/2020 2132   BASOSABS 0.1 06/11/2020 2132   BMP Latest Ref Rng & Units 06/17/2020 06/16/2020 06/15/2020  Glucose 70 - 99 mg/dL 147(H) 132(H) 110(H)  BUN 6 - 20 mg/dL _0 Creatinine 0.61 - 1.24 mg/dL 1.62(H) 1.58(H) 1.97(H)  Sodium 135 - 145 mmol/L 148(H) 150(H) 147(H)  Potassium 3.5 - 5.1 mmol/L 3.4(L) 3.9 3.9  Chloride  98 - 111 mmol/L 117(H) 121(H) 119(H)  CO2 22 - 32 mmol/L 25 23 22  Calcium 8.9 - 10.3 mg/dL 8.4(L) 8.3(L) 8.4(L)    BP (!) 104/46   Pulse 97   Temp (!) 100.6 F (38.1 C)   Resp 17   Ht 5' 5" (1.651 m)   Wt 95.1 kg   SpO2 98%   BMI 34.89  kg/m    I/O last 3 completed shifts: In: 7801.8 [I.V.:5854.9; Other:5; NG/GT:1736.2; IV Piggyback:205.7] Out: 6355 [Urine:4205; Emesis/NG output:2050; Drains:100] No intake/output data recorded.  SpO2: 98 % O2 Flow Rate (L/min): 40 L/min FiO2 (%): 28 %  Estimated body mass index is 34.89 kg/m as calculated from the following:   Height as of this encounter: 5' 5" (1.651 m).   Weight as of this encounter: 95.1 kg.  SIGNIFICANT EVENTS   REVIEW OF SYSTEMS  PATIENT IS UNABLE TO PROVIDE COMPLETE REVIEW OF SYSTEMS DUE TO SEVERE CRITICAL ILLNESS        PHYSICAL EXAMINATION:  GENERAL:critically ill appearing, +resp distress EYES: Pupils equal, round, reactive to light.  No scleral icterus.  MOUTH: Moist mucosal membrane. NECK: Supple.  PULMONARY: +rhonchi, +wheezing CARDIOVASCULAR: S1 and S2. Regular rate and rhythm. No murmurs, rubs, or gallops.  GASTROINTESTINAL: +distended.    MUSCULOSKELETAL: No swelling, clubbing, or edema.  NEUROLOGIC: obtunded, GCS<8 SKIN:intact,warm,dry  MEDICATIONS: I have reviewed all medications and confirmed regimen as documented   CULTURE RESULTS   Recent Results (from the past 240 hour(s))  Resp Panel by RT-PCR (Flu A&B, Covid) Nasopharyngeal Swab     Status: None   Collection Time: 06/11/20  9:32 PM   Specimen: Nasopharyngeal Swab; Nasopharyngeal(NP) swabs in vial transport medium  Result Value Ref Range Status   SARS Coronavirus 2 by RT PCR NEGATIVE NEGATIVE Final    Comment: (NOTE) SARS-CoV-2 target nucleic acids are NOT DETECTED.  The SARS-CoV-2 RNA is generally detectable in upper respiratory specimens during the acute phase of infection. The lowest concentration of SARS-CoV-2 viral copies this assay can detect is 138 copies/mL. A negative result does not preclude SARS-Cov-2 infection and should not be used as the sole basis for treatment or other patient management decisions. A negative result may occur with  improper specimen  collection/handling, submission of specimen other than nasopharyngeal swab, presence of viral mutation(s) within the areas targeted by this assay, and inadequate number of viral copies(<138 copies/mL). A negative result must be combined with clinical observations, patient history, and epidemiological information. The expected result is Negative.  Fact Sheet for Patients:  https://www.fda.gov/media/152166/download  Fact Sheet for Healthcare Providers:  https://www.fda.gov/media/152162/download  This test is no t yet approved or cleared by the [place] FDA and  has been authorized for detection and/or diagnosis of SARS-CoV-2 by FDA under an Emergency Use Authorization (EUA). This EUA will remain  in effect (meaning this test can be used) for the duration of the COVID-19 declaration under Section 564(b)(1) of the Act, 21 U.S.C.section 360bbb-3(b)(1), unless the authorization is terminated  or revoked sooner.       Influenza A by PCR NEGATIVE NEGATIVE Final   Influenza B by PCR NEGATIVE NEGATIVE Final    Comment: (NOTE) The Xpert Xpress SARS-CoV-2/FLU/RSV plus assay is intended as an aid in the diagnosis of influenza from Nasopharyngeal swab specimens and should not be used as a sole basis for treatment. Nasal washings and aspirates are unacceptable for Xpert Xpress SARS-CoV-2/FLU/RSV testing.  Fact Sheet for Patients: https://www.fda.gov/media/152166/download  Fact Sheet for Healthcare Providers: https://www.fda.gov/media/152162/download    This test is not yet approved or cleared by the [place] and has been authorized for detection and/or diagnosis of SARS-CoV-2 by FDA under an Emergency Use Authorization (EUA). This EUA will remain in effect (meaning this test can be used) for the duration of the COVID-19 declaration under Section 564(b)(1) of the Act, 21 U.S.C. section 360bbb-3(b)(1), unless the authorization is terminated or revoked.  Performed at [facility], [address] [place], [place] [address]   Blood culture (routine x 2)     Status: None (Preliminary result)   Collection Time: 06/12/20 12:17 AM   Specimen: BLOOD  Result Value Ref Range Status   Specimen Description BLOOD RIGHT FA  Final   Special Requests   Final    BOTTLES DRAWN AEROBIC AND ANAEROBIC Blood Culture results may not be optimal due to an inadequate volume of blood received in culture bottles   Culture   Final    NO GROWTH 4 DAYS Performed at [facility], [address], [place], [place] [address]    Report Status PENDING  Incomplete  Blood culture (routine x 2)     Status: None (Preliminary result)   Collection Time: 06/12/20 12:45 AM   Specimen: BLOOD  Result Value Ref Range Status   Specimen Description BLOOD RIGHT HAND  Final   Special Requests   Final    BOTTLES DRAWN AEROBIC AND ANAEROBIC Blood Culture results may not be optimal due to an inadequate volume of blood received in culture bottles   Culture   Final    NO GROWTH 4 DAYS Performed at [facility], [address] [place], [place] [address]    Report Status PENDING  Incomplete  MRSA PCR Screening     Status: None   Collection Time: 06/12/20  3:27 AM   Specimen: Nasopharyngeal  Result Value Ref Range Status   MRSA by PCR NEGATIVE NEGATIVE Final    Comment:        The GeneXpert MRSA Assay (FDA approved for NASAL specimens only), is one component of a comprehensive MRSA colonization surveillance program. It is not intended to diagnose MRSA infection nor to guide or monitor treatment for MRSA infections. Performed at [facility], [place], [place] [address]   Respiratory (~20 pathogens) panel by PCR     Status: None   Collection Time: 06/14/20  5:30 PM   Specimen: Nasopharyngeal Swab; Respiratory  Result Value Ref Range Status   Adenovirus NOT DETECTED NOT DETECTED Final   Coronavirus 229E NOT DETECTED NOT DETECTED Final     Comment: (NOTE) The Coronavirus on the Respiratory Panel, DOES NOT test for the novel  Coronavirus (2019 nCoV)    Coronavirus HKU1 NOT DETECTED NOT DETECTED Final   Coronavirus NL63 NOT DETECTED NOT DETECTED Final   Coronavirus OC43 NOT DETECTED NOT DETECTED Final   Metapneumovirus NOT DETECTED NOT DETECTED Final   Rhinovirus / Enterovirus NOT DETECTED NOT DETECTED Final   Influenza A NOT DETECTED NOT DETECTED Final   Influenza B NOT DETECTED NOT DETECTED Final   Parainfluenza Virus 1 NOT DETECTED NOT DETECTED Final   Parainfluenza Virus 2 NOT DETECTED NOT DETECTED Final   Parainfluenza Virus 3 NOT DETECTED NOT DETECTED Final   Parainfluenza Virus 4 NOT DETECTED NOT DETECTED Final   Respiratory Syncytial Virus NOT DETECTED NOT DETECTED Final   Bordetella pertussis NOT DETECTED NOT DETECTED Final   Bordetella Parapertussis NOT DETECTED NOT DETECTED Final   Chlamydophila pneumoniae NOT  DETECTED NOT DETECTED Final   Mycoplasma pneumoniae NOT DETECTED NOT DETECTED Final    Comment: Performed at [facility], [place] [address] [place], [place] [address]  Culture, Respiratory w Gram Stain     Status: None (Preliminary result)   Collection Time: 06/14/20  5:46 PM   Specimen: Tracheal Aspirate; Respiratory  Result Value Ref Range Status   Specimen Description   Final    TRACHEAL ASPIRATE Performed at [facility], [address] [place], [place] [address]    Special Requests   Final    NONE Performed at [facility], [address] [place], [place] [address]    Gram Stain   Final    MODERATE WBC PRESENT,BOTH PMN AND MONONUCLEAR MODERATE GRAM NEGATIVE RODS FEW GRAM POSITIVE COCCI IN PAIRS IN CLUSTERS    Culture   Final    CULTURE REINCUBATED FOR BETTER GROWTH Performed at [facility], [place] [address], [place], [place] [address]    Report Status PENDING  Incomplete  Aerobic/Anaerobic Culture (surgical/deep wound)     Status: None (Preliminary result)    Collection Time: 06/15/20 12:29 PM   Specimen: Abscess  Result Value Ref Range Status   Specimen Description   Final    ABSCESS Performed at [facility], [address] [place], [place] [address]    Special Requests   Final    NONE Performed at [facility], [place], [place] [address]    Gram Stain NO ORGANISMS SEEN  Final   Culture   Final    NO GROWTH < 24 HOURS Performed at [facility], [place] [address], [place], [place] [address]    Report Status PENDING  Incomplete          IMAGING    No results found.   Nutrition Status: Nutrition Problem: Inadequate oral intake Etiology: inability to eat Signs/Symptoms: NPO status Interventions: Tube feeding,Prostat     Indwelling Urinary Catheter continued, requirement due to   Reason to continue Indwelling Urinary Catheter strict Intake/Output monitoring for hemodynamic instability   Central Line/ continued, requirement due to  Reason to continue [place] of central venous pressure or other hemodynamic parameters and poor IV access   Ventilator continued, requirement due to severe respiratory failure   Ventilator Sedation RASS 0 to -2      ASSESSMENT AND PLAN SYNOPSIS 30 yo male admitted withleft glenohumeral joint dislocation with greater tuberosity fracture pt s/p left shoulder reduction in ER; pancreatitis; and seizure activity secondary to severe ETOH withdrawalwith progressive respiratory and aspiration pneumonia and renal failure requiring emergent intubate and MV support, complicated by abd fluid collection s/p IR drainage  Severe ACUTE Hypoxic and Hypercapnic Respiratory Failure -continue Full MV support -continue Bronchodilator Therapy -Wean Fio2 and PEEP as tolerated -will perform SAT/SBT when respiratory parameters are met -VAP/VENT bundle implementation   obesity, possible OSA.   Will certainly impact respiratory mechanics, ventilator  weaning Suspect will need to consider additional PEEP   ACUTE KIDNEY INJURY/Renal Failure -continue Foley Catheter-assess need -Avoid nephrotoxic agents -Follow urine output, BMP -Ensure adequate renal perfusion, optimize oxygenation -Renal dose medications   GISEVERE PANCREATITIS complicated by 9CM  fluid collection +abd distention causing impairment of resp mechanics S/p IR drain placement 3/3 GI PROPHYLAXIS as indicated DIET-->TF's as tolerated Constipation protocol as indicated Repeat CT abd in 48 hrs    NEURO Acute encephalopathy secondary to severe ETOH withdrawal  Seizure activity secondary to ETOH  withdrwal Goal RASS -2 to -3   SEPSIS -use vasopressors to keep MAP>65 if needed -follow ABG and LA -follow up cultures -emperic ABX  CARDIAC ICU monitoring  ID -continue IV abx as prescibed -follow up cultures  GI GI PROPHYLAXIS as indicated    DIET-->TF's as tolerated Constipation protocol as indicated  ENDO - will use ICU hypoglycemic\Hyperglycemia protocol if indicated     ELECTROLYTES -follow labs as needed -replace as needed -pharmacy consultation and following   DVT/GI PRX ordered and assessed TRANSFUSIONS AS NEEDED MONITOR FSBS I Assessed the need for Labs I Assessed the need for Foley I Assessed the need for Central Venous Line Family Discussion when available I Assessed the need for Mobilization I made an Assessment of medications to be adjusted accordingly Safety Risk assessment completed   CASE DISCUSSED IN MULTIDISCIPLINARY ROUNDS WITH ICU TEAM  Critical Care Time devoted to patient care services described in this note is 56 minutes.   Overall, patient is critically ill, prognosis is guarded.  Patient with Multiorgan failure and at high risk for cardiac arrest and death.    Kurian David Kasa, M.D.  [facility]  Medical Director ICU-[facility] [place] Medical Director [facility] Cardio-Pulmonary  Department      

## 2020-06-18 ENCOUNTER — Inpatient Hospital Stay: Payer: Self-pay

## 2020-06-18 ENCOUNTER — Encounter: Payer: Self-pay | Admitting: Family Medicine

## 2020-06-18 LAB — COMPREHENSIVE METABOLIC PANEL
ALT: 22 U/L (ref 0–44)
AST: 36 U/L (ref 15–41)
Albumin: 1.9 g/dL — ABNORMAL LOW (ref 3.5–5.0)
Alkaline Phosphatase: 110 U/L (ref 38–126)
Anion gap: 8 (ref 5–15)
BUN: 8 mg/dL (ref 6–20)
CO2: 25 mmol/L (ref 22–32)
Calcium: 8.3 mg/dL — ABNORMAL LOW (ref 8.9–10.3)
Chloride: 111 mmol/L (ref 98–111)
Creatinine, Ser: 1.73 mg/dL — ABNORMAL HIGH (ref 0.61–1.24)
GFR, Estimated: 54 mL/min — ABNORMAL LOW (ref >60)
Glucose, Bld: 166 mg/dL — ABNORMAL HIGH (ref 70–99)
Potassium: 3.2 mmol/L — ABNORMAL LOW (ref 3.5–5.1)
Sodium: 144 mmol/L (ref 135–145)
Total Bilirubin: 1.4 mg/dL — ABNORMAL HIGH (ref 0.3–1.2)
Total Protein: 5.9 g/dL — ABNORMAL LOW (ref 6.5–8.1)

## 2020-06-18 LAB — CBC
HCT: 31.2 % — ABNORMAL LOW (ref 39.0–52.0)
Hemoglobin: 9.5 g/dL — ABNORMAL LOW (ref 13.0–17.0)
MCH: 30 pg (ref 26.0–34.0)
MCHC: 30.4 g/dL (ref 30.0–36.0)
MCV: 98.4 fL (ref 80.0–100.0)
Platelets: 448 10*3/uL — ABNORMAL HIGH (ref 150–400)
RBC: 3.17 MIL/uL — ABNORMAL LOW (ref 4.22–5.81)
RDW: 13.4 % (ref 11.5–15.5)
WBC: 13.2 10*3/uL — ABNORMAL HIGH (ref 4.0–10.5)
nRBC: 0.5 % — ABNORMAL HIGH (ref 0.0–0.2)

## 2020-06-18 LAB — MAGNESIUM: Magnesium: 1.8 mg/dL (ref 1.7–2.4)

## 2020-06-18 LAB — GLUCOSE, CAPILLARY
Glucose-Capillary: 136 mg/dL — ABNORMAL HIGH (ref 70–99)
Glucose-Capillary: 137 mg/dL — ABNORMAL HIGH (ref 70–99)
Glucose-Capillary: 146 mg/dL — ABNORMAL HIGH (ref 70–99)
Glucose-Capillary: 156 mg/dL — ABNORMAL HIGH (ref 70–99)
Glucose-Capillary: 161 mg/dL — ABNORMAL HIGH (ref 70–99)
Glucose-Capillary: 181 mg/dL — ABNORMAL HIGH (ref 70–99)

## 2020-06-18 LAB — PHOSPHORUS: Phosphorus: 4.6 mg/dL (ref 2.5–4.6)

## 2020-06-18 LAB — LIPASE, BLOOD: Lipase: 38 U/L (ref 11–51)

## 2020-06-18 LAB — AMYLASE: Amylase: 64 U/L (ref 28–100)

## 2020-06-18 MED ORDER — MAGNESIUM SULFATE 2 GM/50ML IV SOLN
2.0000 g | Freq: Once | INTRAVENOUS | Status: AC
  Administered 2020-06-18: 2 g via INTRAVENOUS
  Filled 2020-06-18: qty 50

## 2020-06-18 MED ORDER — VECURONIUM BROMIDE 10 MG IV SOLR
10.0000 mg | INTRAVENOUS | Status: DC | PRN
  Administered 2020-06-18 – 2020-06-20 (×4): 10 mg via INTRAVENOUS
  Filled 2020-06-18 (×4): qty 10

## 2020-06-18 MED ORDER — POTASSIUM CHLORIDE 20 MEQ PO PACK
20.0000 meq | PACK | ORAL | Status: AC
  Administered 2020-06-18 (×2): 20 meq
  Filled 2020-06-18 (×2): qty 1

## 2020-06-18 MED ORDER — ACETAMINOPHEN 160 MG/5ML PO SOLN
650.0000 mg | Freq: Four times a day (QID) | ORAL | Status: DC | PRN
  Administered 2020-06-18 – 2020-06-23 (×12): 650 mg
  Filled 2020-06-18 (×14): qty 20.3

## 2020-06-18 MED ORDER — LORAZEPAM 2 MG/ML IJ SOLN
2.0000 mg | INTRAMUSCULAR | Status: DC | PRN
  Administered 2020-06-18 – 2020-06-22 (×3): 2 mg via INTRAVENOUS
  Filled 2020-06-18 (×3): qty 1

## 2020-06-18 MED ORDER — POTASSIUM CHLORIDE 10 MEQ/50ML IV SOLN
10.0000 meq | INTRAVENOUS | Status: AC
  Administered 2020-06-18 (×4): 10 meq via INTRAVENOUS
  Filled 2020-06-18 (×4): qty 50

## 2020-06-18 MED ORDER — ENOXAPARIN SODIUM 60 MG/0.6ML ~~LOC~~ SOLN
0.5000 mg/kg | SUBCUTANEOUS | Status: DC
  Administered 2020-06-19 – 2020-06-26 (×8): 52.5 mg via SUBCUTANEOUS
  Filled 2020-06-18 (×9): qty 0.6

## 2020-06-18 NOTE — Progress Notes (Signed)
   06/18/20 1800  Vitals  Temp (!) 101.1 F (38.4 C)  Temp Source Axillary    650mg  acetaminophen given per tube for fever

## 2020-06-18 NOTE — Progress Notes (Signed)
Patient had episode of increased vent dyssynchrony, decreased spO2, and copious thick, bilious secretions coming from mouth. Dr. Belia Heman aware. bolus of Fentanyl, 2mg  Ativan, 10mg  of Vecuronium given. Oral cavity suctioned of all secretions. Tube feeds held per Dr. .

## 2020-06-18 NOTE — Progress Notes (Signed)
CRITICAL CARE NOTE 30 yo male admitted withleft glenohumeral joint dislocation with greater tuberosity fracture pt s/p left shoulder reduction in ER; pancreatitis; and seizure activity secondary to severe ETOH withdrawal   heavy drinker and stopped drinking 2 days prior to ER presentation. Per ER notes he had a witnessed tonic-clonic seizurethat lasted a few minutesat home prior toarrival to the ER  Left shoulder x-ray concerning for anterior inferior glenohumeral fracture dislocation. In the ER left shoulder reduction performed and left shoulder immobilizer placed  worsening ETOH withdrawal symptoms despite prn ativan requiring precedex gtt. Pt changed to ICU status and PCCM team consulted 03/1 to assist with management. Increased WOB and SOB  Procedures:  Left Shoulder Reduction JP drain into abd 3/3  Significant Diagnostic Tests:  CT Abd Pelvis 02/27>>Findings consistent with acute interstitial pancreatitis. Correlation with lipase is recommended. Small volume intraperitoneal and retroperitoneal free fluid. Trace bilateral pleural effusions. Distended urinary bladder. CT Head Cervical Spine 02/27>>No acute intracranial abnormality. No acute fracture or malalignment of the spine. CT Shoulder Left 02/28>>Interval reduction of the anterior dislocation with the glenohumeral joint in near anatomic location. Comminuted slightly impacted Hill-Sachs deformity which involve the anterior bicipital groove.  Micro Data:  Respiratory Panel by RT-PCR 02/27>>negative  Blood x2 02/28>>negative    Consults:  02/28: Pt initially admitted to the progressive care unit, however status changed to ICU for closer monitoring 03/1: Pt with worsening ETOH withdrawal symptoms requiring precedex gtt. PCCM team consulted to assist with management  03/1: Orthopedic consulted for left glenohumeral joint dislocation with greater tuberosity fracture pt s/p left shoulder reduction in ER. Per  orthopedic surgery pt opting to proceed with non surgical management with the arm in a sling and non weightbearing of the left upper extremity 3/3IR consulted   Significant Hospital Events:  02/28severe ETOH withdrawal, severe pancreatitis 3/1 severe pancreatitis 3/2 increased WOB and SOB, severe DT's, patient emergently intubated 3/2 PICC line placed 3/3 remains intubated, JP drain placed into 9cm fluid collection by IR 3/3 IR placed dubhoff tube, started trickle feeds 3/4 remains on vent, severe pancreatitis 3/5 OG suction c/w TF's, severe abd distention         CC  follow up respiratory failure Severe pancreatitis  SUBJECTIVE Patient remains critically ill Prognosis is guarded Severe abdominal distention Increased work of breathing Unable to wean from ventilator Multiorgan failure  Vent Mode: PRVC FiO2 (%):  [21 %-28 %] 21 % Set Rate:  [15 bmp] 15 bmp Vt Set:  [500 mL] 500 mL PEEP:  [5 cmH20] 5 cmH20 Plateau Pressure:  [29 cmH20-37 cmH20] 37 cmH20  CBC    Component Value Date/Time   WBC 13.2 (H) 06/18/2020 0400   RBC 3.17 (L) 06/18/2020 0400   HGB 9.5 (L) 06/18/2020 0400   HGB 14.9 08/18/2012 1154   HCT 31.2 (L) 06/18/2020 0400   HCT 44.8 08/18/2012 1154   PLT 448 (H) 06/18/2020 0400   PLT 349 08/18/2012 1154   MCV 98.4 06/18/2020 0400   MCV 89 08/18/2012 1154   MCH 30.0 06/18/2020 0400   MCHC 30.4 06/18/2020 0400   RDW 13.4 06/18/2020 0400   RDW 13.2 08/18/2012 1154   LYMPHSABS 0.6 (L) 06/11/2020 2132   MONOABS 1.0 06/11/2020 2132   EOSABS 0.0 06/11/2020 2132   BASOSABS 0.1 06/11/2020 2132   BMP Latest Ref Rng & Units 06/18/2020 06/17/2020 06/16/2020  Glucose 70 - 99 mg/dL 161(W) 960(A) 540(J)  BUN 6 - 20 mg/dL Creatinine 0.61 - 1.24  mg/dL 0.25(E) 5.27(P) 8.24(M)  Sodium 135 - 145 mmol/L 144 148(H) 150(H)  Potassium 3.5 - 5.1 mmol/L 3.2(L) 3.4(L) 3.9  Chloride 98 - 111 mmol/L 111 117(H) 121(H)  CO2 22 - 32 mmol/L 25 25 23   Calcium 8.9  - 10.3 mg/dL 8.3(L) 8.4(L) 8.3(L)     BP (!) 115/58   Pulse 95   Temp 99.2 F (37.3 C) (Axillary)   Resp 18   Ht 5\' 5"  (1.651 m)   Wt 103.1 kg   SpO2 94%   BMI 37.82 kg/m    I/O last 3 completed shifts: In: 11449.9 [P.O.:800; I.V.:7756.4; Other:20; NG/GT:2521.4; IV Piggyback:352] Out: 5785 [Urine:3700; Emesis/NG output:2060; Drains:25] No intake/output data recorded.  SpO2: 94 % O2 Flow Rate (L/min): 40 L/min FiO2 (%): 21 %  Estimated body mass index is 37.82 kg/m as calculated from the following:   Height as of this encounter: 5\' 5"  (1.651 m).   Weight as of this encounter: 103.1 kg.  SIGNIFICANT EVENTS   REVIEW OF SYSTEMS  PATIENT IS UNABLE TO PROVIDE COMPLETE REVIEW OF SYSTEMS DUE TO SEVERE CRITICAL ILLNESS        PHYSICAL EXAMINATION:  GENERAL:critically ill appearing, +resp distress EYES: Pupils equal, round, reactive to light.  No scleral icterus.  MOUTH: Moist mucosal membrane. NECK: Supple.  PULMONARY: +rhonchi, +wheezing CARDIOVASCULAR: S1 and S2. Regular rate and rhythm. No murmurs, rubs, or gallops.  GASTROINTESTINAL: +distended.  NEG bowel sounds.   MUSCULOSKELETAL: No swelling, clubbing, or edema.  NEUROLOGIC: obtunded, GCS<8 SKIN:intact,warm,dry  MEDICATIONS: I have reviewed all medications and confirmed regimen as documented   CULTURE RESULTS   Recent Results (from the past 240 hour(s))  Resp Panel by RT-PCR (Flu A&B, Covid) Nasopharyngeal Swab     Status: None   Collection Time: 06/11/20  9:32 PM   Specimen: Nasopharyngeal Swab; Nasopharyngeal(NP) swabs in vial transport medium  Result Value Ref Range Status   SARS Coronavirus 2 by RT PCR NEGATIVE NEGATIVE Final    Comment: (NOTE) SARS-CoV-2 target nucleic acids are NOT DETECTED.  The SARS-CoV-2 RNA is generally detectable in upper respiratory specimens during the acute phase of infection. The lowest concentration of SARS-CoV-2 viral copies this assay can detect is 138 copies/mL.  A negative result does not preclude SARS-Cov-2 infection and should not be used as the sole basis for treatment or other patient management decisions. A negative result may occur with  improper specimen collection/handling, submission of specimen other than nasopharyngeal swab, presence of viral mutation(s) within the areas targeted by this assay, and inadequate number of viral copies(<138 copies/mL). A negative result must be combined with clinical observations, patient history, and epidemiological information. The expected result is Negative.  Fact Sheet for Patients:   Fact Sheet for Healthcare Providers:   This test is no t yet approved or cleared by the 06/13/20 FDA and  has been authorized for detection and/or diagnosis of SARS-CoV-2 by FDA under an Emergency Use Authorization (EUA). This EUA will remain  in effect (meaning this test can be used) for the duration of the COVID-19 declaration under Section 564(b)(1) of the Act, 21 U.S.C.section 360bbb-3(b)(1), unless the authorization is terminated  or revoked sooner.       Influenza A by PCR NEGATIVE NEGATIVE Final   Influenza B by PCR NEGATIVE NEGATIVE Final    Comment: (NOTE) The Xpert Xpress SARS-CoV-2/FLU/RSV plus assay is intended as an aid in the diagnosis of influenza from Nasopharyngeal swab specimens and should not be used as a  sole basis for treatment. Nasal washings and aspirates are unacceptable for Xpert Xpress SARS-CoV-2/FLU/RSV testing.  Fact Sheet for Patients: BloggerCourse.comhttps://www.fda.gov/media/152166/download  Fact Sheet for Healthcare Providers: SeriousBroker.ithttps://www.fda.gov/media/152162/download  This test is not yet approved or cleared by the [place] FDA and has been authorized for detection and/or diagnosis of SARS-CoV-2 by FDA under an Emergency Use Authorization (EUA). This EUA will remain in effect (meaning this test  can be used) for the duration of the COVID-19 declaration under Section 564(b)(1) of the Act, 21 U.S.C. section 360bbb-3(b)(1), unless the authorization is terminated or revoked.  Performed at [facility], [address] [place], [place] [address]   Blood culture (routine x 2)     Status: None   Collection Time: 06/12/20 12:17 AM   Specimen: BLOOD  Result Value Ref Range Status   Specimen Description BLOOD RIGHT FA  Final   Special Requests   Final    BOTTLES DRAWN AEROBIC AND ANAEROBIC Blood Culture results may not be optimal due to an inadequate volume of blood received in culture bottles   Culture   Final    NO GROWTH 5 DAYS Performed at [facility], [address] [place], [place] [address]    Report Status 06/17/2020 FINAL  Final  Blood culture (routine x 2)     Status: None   Collection Time: 06/12/20 12:45 AM   Specimen: BLOOD  Result Value Ref Range Status   Specimen Description BLOOD RIGHT HAND  Final   Special Requests   Final    BOTTLES DRAWN AEROBIC AND ANAEROBIC Blood Culture results may not be optimal due to an inadequate volume of blood received in culture bottles   Culture   Final    NO GROWTH 5 DAYS Performed at [facility], [address] [place], [place] [address]    Report Status 06/17/2020 FINAL  Final  MRSA PCR Screening     Status: None   Collection Time: 06/12/20  3:27 AM   Specimen: Nasopharyngeal  Result Value Ref Range Status   MRSA by PCR NEGATIVE NEGATIVE Final    Comment:        The GeneXpert MRSA Assay (FDA approved for NASAL specimens only), is one component of a comprehensive MRSA colonization surveillance program. It is not intended to diagnose MRSA infection nor to guide or monitor treatment for MRSA infections. Performed at [facility], [address] [place], [place] [address]   Respiratory (~20 pathogens) panel by PCR     Status: None   Collection Time: 06/14/20  5:30 PM    Specimen: Nasopharyngeal Swab; Respiratory  Result Value Ref Range Status   Adenovirus NOT DETECTED NOT DETECTED Final   Coronavirus 229E NOT DETECTED NOT DETECTED Final    Comment: (NOTE) The Coronavirus on the Respiratory Panel, DOES NOT test for the novel  Coronavirus (2019 nCoV)    Coronavirus HKU1 NOT DETECTED NOT DETECTED Final   Coronavirus NL63 NOT DETECTED NOT DETECTED Final   Coronavirus OC43 NOT DETECTED NOT DETECTED Final   Metapneumovirus NOT DETECTED NOT DETECTED Final   Rhinovirus / Enterovirus NOT DETECTED NOT DETECTED Final   Influenza A NOT DETECTED NOT DETECTED Final   Influenza B NOT DETECTED NOT DETECTED Final   Parainfluenza Virus 1 NOT DETECTED NOT DETECTED Final   Parainfluenza Virus 2 NOT DETECTED NOT DETECTED Final   Parainfluenza Virus 3 NOT DETECTED NOT DETECTED Final   Parainfluenza Virus 4 NOT DETECTED NOT DETECTED Final   Respiratory Syncytial Virus NOT  DETECTED NOT DETECTED Final   Bordetella pertussis NOT DETECTED NOT DETECTED Final   Bordetella Parapertussis NOT DETECTED NOT DETECTED Final   Chlamydophila pneumoniae NOT DETECTED NOT DETECTED Final   Mycoplasma pneumoniae NOT DETECTED NOT DETECTED Final    Comment: Performed at [facility], [address] [place], [place] [address]  Culture, Respiratory w Gram Stain     Status: None   Collection Time: 06/14/20  5:46 PM   Specimen: Tracheal Aspirate; Respiratory  Result Value Ref Range Status   Specimen Description   Final    TRACHEAL ASPIRATE Performed at [facility], [address], [place], [place] [address]    Special Requests   Final    NONE Performed at [facility], [address] [place], [place] [address]    Gram Stain   Final    MODERATE WBC PRESENT,BOTH PMN AND MONONUCLEAR MODERATE GRAM NEGATIVE RODS FEW GRAM POSITIVE COCCI IN PAIRS IN CLUSTERS    Culture   Final    FEW Normal respiratory flora-no Staph aureus or Pseudomonas seen Performed at [facility], [address], [place], [place] [address]    Report Status 06/17/2020 FINAL  Final  Aerobic/Anaerobic Culture (surgical/deep wound)     Status: None (Preliminary result)   Collection Time: 06/15/20 12:29 PM   Specimen: Abscess  Result Value Ref Range Status   Specimen Description   Final    ABSCESS Performed at [facility], [address], [place], [place] [address]    Special Requests   Final    NONE Performed at [facility], [address], [place], [place] [address]    Gram Stain NO ORGANISMS SEEN  Final   Culture   Final    NO GROWTH 2 DAYS Performed at [facility], [address] [place], [place] [address]    Report Status PENDING  Incomplete          IMAGING    DG Abd 1 View  Result Date: 06/18/2020 CLINICAL DATA:  Orogastric tube placement EXAM: ABDOMEN - 1 VIEW COMPARISON:  06/17/2020 FINDINGS: Nasogastric tube extends into the distal body of the stomach. Nasoenteric feeding tube extends into the expected juncture of the second and third portion of the duodenum. Percutaneous drainage catheter is again seen overlying the epigastrium. Pelvis excluded from view. Visualized abdominal gas pattern is normal. No gross free intraperitoneal gas. IMPRESSION: Nasoenteric feeding tube tip within the juncture of the second and third portion of the duodenum. Nasogastric tube within the distal stomach. Electronically Signed   By: Helyn Numbers MD   On: 06/18/2020 02:42   DG Abd 1 View  Result Date: 06/17/2020 CLINICAL DATA:  Orogastric tube placement. EXAM: ABDOMEN - 1 VIEW COMPARISON:  None. FINDINGS: Tip of the weighted enteric tube in the region of the 2/3 portion of the duodenum. Drainage catheter projects over the central abdomen. Patient's hand is in the field of view, shoulder is in a sling. IMPRESSION: Tip of the weighted enteric tube in the region of the 2/3 portion of the duodenum. Electronically Signed   By: Narda Rutherford M.D.    On: 06/17/2020 20:03   DG Chest Port 1 View  Result Date: 06/18/2020 CLINICAL DATA:  Respiratory failure EXAM: PORTABLE CHEST 1 VIEW COMPARISON:  06/17/2020 FINDINGS: Endotracheal tube has been advanced and its tip is now seen 2.6 cm above the carina. Nasogastric tube and nasoenteric feeding tube extend into the upper abdomen. Right upper  extremity PICC line tip noted within the superior cavoatrial junction. Lung volumes remain extremely small though pulmonary insufflation remain stable since prior examination. The lungs are clear. No pneumothorax or pleural effusion. Cardiac size is within normal limits when accounting for poor pulmonary insufflation. The pulmonary vascularity is normal. IMPRESSION: Support lines and tubes now in appropriate position. Stable pulmonary hypoinflation. Electronically Signed   By: Helyn Numbers MD   On: 06/18/2020 02:39   DG Chest Port 1 View  Result Date: 06/17/2020 CLINICAL DATA:  Respiratory failure. EXAM: PORTABLE CHEST 1 VIEW COMPARISON:  June 14, 2020 FINDINGS: The ET tube terminates at the thoracic inlet, 7.5 cm above the carina. It has been pulled back a couple cm in the interval. The NG and feeding tubes prior terminate below today's film. A right PICC line is stable. No pneumothorax. The left lung is clear. The cardiomediastinal silhouette is stable. Mild opacity in the right base is more pronounced in the interval. IMPRESSION: 1. The ETT terminates at the thoracic inlet. Recommend advancing 3 cm. 2. Other support apparatus as above. 3. Mild opacity in the right base is more pronounced in the interval and could represent atelectasis or developing infiltrate. Recommend attention on follow-up. Findings discussed with Myra Flowers Electronically Signed   By: Gerome Sam III M.D   On: 06/17/2020 08:48     Nutrition Status: Nutrition Problem: Inadequate oral intake Etiology: inability to eat Signs/Symptoms: NPO status Interventions: Tube  feeding,Prostat     Indwelling Urinary Catheter continued, requirement due to   Reason to continue Indwelling Urinary Catheter strict Intake/Output monitoring for hemodynamic instability   Central Line/ continued, requirement due to  Reason to continue [employer] Monitoring of central venous pressure or other hemodynamic parameters and poor IV access   Ventilator continued, requirement due to severe respiratory failure   Ventilator Sedation RASS 0 to -2      ASSESSMENT AND PLAN SYNOPSIS 30 yo male admitted withleft glenohumeral joint dislocation with greater tuberosity fracture pt s/p left shoulder reduction in ER; pancreatitis; and seizure activity secondary to severe ETOH withdrawalwith progressive respiratory and aspiration pneumonia and renal failure requiring emergent intubate and MV support, complicated by abd fluid collection s/p IR drainage  Severe ACUTE Hypoxic and Hypercapnic Respiratory Failure -continue Full MV support -continue Bronchodilator Therapy -Wean Fio2 and PEEP as tolerated -VAP/VENT bundle implementation  unable to wean due to increased abdominal distention-severe pancreatitis   obesity, possible OSA.   Will certainly impact respiratory mechanics   ACUTE KIDNEY INJURY/Renal Failure -continue Foley Catheter-assess need -Avoid nephrotoxic agents -Follow urine output, BMP -Ensure adequate renal perfusion, optimize oxygenation -Renal dose medications   GISEVERE PANCREATITIScomplicated by 9CM fluid collection +abd distention causing impairment of resp mechanics S/p IR drain placement 3/3 GI PROPHYLAXIS as indicated DIET-->TF's as tolerated Constipation protocol as indicated Repeat CT abd in 48 hrs    NEURO Acute encephalopathy secondary to severe ETOH withdrawal  Seizure activity secondary to ETOH withdrwal Goal RASS -2 to -3   SEPSIS -use vasopressors to keep MAP>65 if needed -follow ABG and LA -follow up cultures -emperic  ABX   CARDIAC ICU monitoring  ID -continue IV abx as prescibed -follow up cultures  GI GI PROPHYLAXIS as indicated   DIET-->TF's as tolerated via dubhoff OG stopped LIS +abd distention TF's decreased rate to 25 cc/hr Constipation protocol as indicated  ENDO - will use ICU hypoglycemic\Hyperglycemia protocol if indicated     ELECTROLYTES -follow labs as needed -replace as needed -pharmacy consultation and  following   DVT/GI PRX ordered and assessed TRANSFUSIONS AS NEEDED MONITOR FSBS I Assessed the need for Labs I Assessed the need for Foley I Assessed the need for Central Venous Line Family Discussion when available I Assessed the need for Mobilization I made an Assessment of medications to be adjusted accordingly Safety Risk assessment completed   CASE DISCUSSED IN MULTIDISCIPLINARY ROUNDS WITH ICU TEAM  Critical Care Time devoted to patient care services described in this note is 57 minutes.   Overall, patient is critically ill, prognosis is guarded.  Patient with Multiorgan failure and at high risk for cardiac arrest and death.    Lucie Leather, M.D.  [facility]  Medical Director [facility] Medical Director [facility] Cardio-Pulmonary Department

## 2020-06-18 NOTE — Progress Notes (Signed)
Patient found to be febrile at 1100, oral temp was 103. Message sent to MD for updated acetaminophen orders; orders received. First dose request sent to pharmacy for liquid acetaminophen at 11:12. Second request to pharmacy sent at 12:08. Ice packs applied to bilateral axilla. Medication received at 12:15 and immediately administered.

## 2020-06-18 NOTE — Progress Notes (Signed)
GOALS OF CARE DISCUSSION  The Clinical status was relayed to family in detail. Mother and Grandmother  Updated and notified of patients medical condition.  Patient remains unresponsive and will not open eyes to command.    Patient is having a weak cough and struggling to remove secretions.   Patient with increased WOB and using accessory muscles to breathe Explained to family course of therapy and the modalities     Patient with Progressive multiorgan failure with a very high probablity of a very minimal chance of meaningful recovery despite all aggressive and optimal medical therapy.  Family understands the situation. Patient remains Full CODE   Family are satisfied with Plan of action and management. All questions answered  Additional CC time 32 mins   Navon Kotowski Santiago Glad, M.D.  [facility]  Medical Director [facility] Medical Director [facility] Cardio-Pulmonary Department

## 2020-06-18 NOTE — Progress Notes (Signed)
[facility]  ROUNDING NOTE   Subjective:   UOP 281m Creatinine 1.62 (1.58) D5W at 1556mhr   Objective:  Vital signs in last 24 hours:  Temp:  [99.2 F (37.3 C)-103.1 F (39.5 C)] 100 F (37.8 C) (03/06 0800) Pulse Rate:  [87-120] 100 (03/06 1100) Resp:  [15-27] 18 (03/06 1100) BP: (107-157)/(46-109) 125/51 (03/06 1100) SpO2:  [94 %-100 %] 97 % (03/06 1100) FiO2 (%):  [21 %-28 %] 21 % (03/06 0714) Weight:  [103.1 kg] 103.1 kg (03/06 0432)  Weight change: 8 kg Filed Weights   06/11/20 2124 06/17/20 0323 06/18/20 0432  Weight: 90 kg 95.1 kg 103.1 kg    Intake/Output: I/O last 3 completed shifts: In: 12109.9 [P.O.:800; I.V.:7756.4; Other:20; NG/GT:3181.4; IV Piggyback:352] Out: 570626Urine:3700; Emesis/NG output:2060; Drains:25]   Intake/Output this shift:  Total I/O In: 980.5 [I.V.:654.7; NG/GT:102.9; IV Piggyback:222.8] Out: 560 [Urine:560]  Physical Exam: General: Critically ill  Head: ETT   Eyes: PERRL  Neck: trachea midline  Lungs:  PRVC 28%  Heart: Regular rate and rhythm  Abdomen:  Soft  Extremities:  no peripheral edema.  Neurologic: Intubated and sedated  Skin: No lesions  Access: none    Basic Metabolic Panel: Recent Labs  Lab 06/14/20 0719 06/15/20 0523 06/16/20 0503 06/17/20 0450 06/18/20 0400  NA 145 147* 150* 148* 144  K 3.7 3.9 3.9 3.4* 3.2*  CL 117* 119* 121* 117* 111  CO2 _0 GLUCOSE 109* 110* 132* 147* 166*  BUN _1 CREATININE 1.51* 1.97* 1.58* 1.62* 1.73*  CALCIUM 8.4* 8.4* 8.3* 8.4* 8.3*  MG 2.4 2.1 2.0 1.8 1.8  PHOS 2.6 4.0 5.2* 4.9* 4.6    Liver Function Tests: Recent Labs  Lab 06/14/20 0719 06/15/20 0523 06/16/20 0503 06/17/20 0450 06/18/20 0400  AST 76* 73* 40 28 36  ALT _2 ALKPHOS 75 73 91 91 110  BILITOT 2.2* 2.1* 1.6* 1.4* 1.4*  PROT 6.0* 6.3* 6.0* 6.0* 5.9*  ALBUMIN 2.5* 2.3* 2.0* 2.0* 1.9*   Recent Labs  Lab 06/14/20 0719 06/15/20 0523 06/16/20 0503  06/17/20 0450 06/18/20 0400  LIPASE 64* 52* 45 39 38  AMYLASE 217* 142* 91 71 64   No results for input(s): AMMONIA in the last 168 hours.  CBC: Recent Labs  Lab 06/11/20 2132 06/12/20 0545 06/14/20 0719 06/15/20 0523 06/16/20 0503 06/17/20 0450 06/18/20 0400  WBC 23.5*   < > 9.9 9.5 9.8 11.0* 13.2*  NEUTROABS 21.7*  --   --   --   --   --   --   HGB 15.0   < > 11.2* 10.5* 10.5* 10.4* 9.5*  HCT 45.6   < > 35.7* 34.2* 35.6* 34.0* 31.2*  MCV 91.9   < > 96.0 100.6* 102.6* 100.6* 98.4  PLT 359   < > 222 255 298 382 448*   < > = values in this interval not displayed.    Cardiac Enzymes: No results for input(s): CKTOTAL, CKMB, CKMBINDEX, TROPONINI in the last 168 hours.  BNP: Invalid input(s): POCBNP  CBG: Recent Labs  Lab 06/17/20 1924 06/17/20 2331 06/18/20 0334 06/18/20 0724 06/18/20 1116  GLUCAP 107* 137* 146* 137* 136*    Microbiology: Results for orders placed or performed during the hospital encounter of 06/11/20  Resp Panel by RT-PCR (Flu A&B, Covid) Nasopharyngeal Swab     Status: None   Collection Time: 06/11/20  9:32 PM   Specimen: Nasopharyngeal  Swab; Nasopharyngeal(NP) swabs in vial transport medium  Result Value Ref Range Status   SARS Coronavirus 2 by RT PCR NEGATIVE NEGATIVE Final    Comment: (NOTE) SARS-CoV-2 target nucleic acids are NOT DETECTED.  The SARS-CoV-2 RNA is generally detectable in upper respiratory specimens during the acute phase of infection. The lowest concentration of SARS-CoV-2 viral copies this assay can detect is 138 copies/mL. A negative result does not preclude SARS-Cov-2 infection and should not be used as the sole basis for treatment or other patient management decisions. A negative result may occur with  improper specimen collection/handling, submission of specimen other than nasopharyngeal swab, presence of viral mutation(s) within the areas targeted by this assay, and inadequate number of viral copies(<138  copies/mL). A negative result must be combined with clinical observations, patient history, and epidemiological information. The expected result is Negative.  Fact Sheet for Patients:  EntrepreneurPulse.com.au  Fact Sheet for Healthcare Providers:  IncredibleEmployment.be  This test is no t yet approved or cleared by the [place] FDA and  has been authorized for detection and/or diagnosis of SARS-CoV-2 by FDA under an Emergency Use Authorization (EUA). This EUA will remain  in effect (meaning this test can be used) for the duration of the COVID-19 declaration under Section 564(b)(1) of the Act, 21 U.S.C.section 360bbb-3(b)(1), unless the authorization is terminated  or revoked sooner.       Influenza A by PCR NEGATIVE NEGATIVE Final   Influenza B by PCR NEGATIVE NEGATIVE Final    Comment: (NOTE) The Xpert Xpress SARS-CoV-2/FLU/RSV plus assay is intended as an aid in the diagnosis of influenza from Nasopharyngeal swab specimens and should not be used as a sole basis for treatment. Nasal washings and aspirates are unacceptable for Xpert Xpress SARS-CoV-2/FLU/RSV testing.  Fact Sheet for Patients: EntrepreneurPulse.com.au  Fact Sheet for Healthcare Providers: IncredibleEmployment.be  This test is not yet approved or cleared by the [place] FDA and has been authorized for detection and/or diagnosis of SARS-CoV-2 by FDA under an Emergency Use Authorization (EUA). This EUA will remain in effect (meaning this test can be used) for the duration of the COVID-19 declaration under Section 564(b)(1) of the Act, 21 U.S.C. section 360bbb-3(b)(1), unless the authorization is terminated or revoked.  Performed at [facility], [address] [place], [place] [address]   Blood culture (routine x 2)     Status: None   Collection Time: 06/12/20 12:17 AM   Specimen: BLOOD  Result Value Ref Range  Status   Specimen Description BLOOD RIGHT FA  Final   Special Requests   Final    BOTTLES DRAWN AEROBIC AND ANAEROBIC Blood Culture results may not be optimal due to an inadequate volume of blood received in culture bottles   Culture   Final    NO GROWTH 5 DAYS Performed at [facility], [address] [place], [place] [address]    Report Status 06/17/2020 FINAL  Final  Blood culture (routine x 2)     Status: None   Collection Time: 06/12/20 12:45 AM   Specimen: BLOOD  Result Value Ref Range Status   Specimen Description BLOOD RIGHT HAND  Final   Special Requests   Final    BOTTLES DRAWN AEROBIC AND ANAEROBIC Blood Culture results may not be optimal due to an inadequate volume of blood received in culture bottles   Culture   Final    NO GROWTH 5 DAYS Performed at [facility], [address] [place], [place] [address]  Report Status 06/17/2020 FINAL  Final  MRSA PCR Screening     Status: None   Collection Time: 06/12/20  3:27 AM   Specimen: Nasopharyngeal  Result Value Ref Range Status   MRSA by PCR NEGATIVE NEGATIVE Final    Comment:        The GeneXpert MRSA Assay (FDA approved for NASAL specimens only), is one component of a comprehensive MRSA colonization surveillance program. It is not intended to diagnose MRSA infection nor to guide or monitor treatment for MRSA infections. Performed at [facility], [place], [place] [address]   Respiratory (~20 pathogens) panel by PCR     Status: None   Collection Time: 06/14/20  5:30 PM   Specimen: Nasopharyngeal Swab; Respiratory  Result Value Ref Range Status   Adenovirus NOT DETECTED NOT DETECTED Final   Coronavirus 229E NOT DETECTED NOT DETECTED Final    Comment: (NOTE) The Coronavirus on the Respiratory Panel, DOES NOT test for the novel  Coronavirus (2019 nCoV)    Coronavirus HKU1 NOT DETECTED NOT DETECTED Final   Coronavirus NL63 NOT DETECTED NOT DETECTED Final    Coronavirus OC43 NOT DETECTED NOT DETECTED Final   Metapneumovirus NOT DETECTED NOT DETECTED Final   Rhinovirus / Enterovirus NOT DETECTED NOT DETECTED Final   Influenza A NOT DETECTED NOT DETECTED Final   Influenza B NOT DETECTED NOT DETECTED Final   Parainfluenza Virus 1 NOT DETECTED NOT DETECTED Final   Parainfluenza Virus 2 NOT DETECTED NOT DETECTED Final   Parainfluenza Virus 3 NOT DETECTED NOT DETECTED Final   Parainfluenza Virus 4 NOT DETECTED NOT DETECTED Final   Respiratory Syncytial Virus NOT DETECTED NOT DETECTED Final   Bordetella pertussis NOT DETECTED NOT DETECTED Final   Bordetella Parapertussis NOT DETECTED NOT DETECTED Final   Chlamydophila pneumoniae NOT DETECTED NOT DETECTED Final   Mycoplasma pneumoniae NOT DETECTED NOT DETECTED Final    Comment: Performed at [facility], [place], [place], [place] [address]  Culture, Respiratory w Gram Stain     Status: None   Collection Time: 06/14/20  5:46 PM   Specimen: Tracheal Aspirate; Respiratory  Result Value Ref Range Status   Specimen Description   Final    TRACHEAL ASPIRATE Performed at [facility], [address], [place], [place] [address]    Special Requests   Final    NONE Performed at [facility], [address] [place], [place] [address]    Gram Stain   Final    MODERATE WBC PRESENT,BOTH PMN AND MONONUCLEAR MODERATE GRAM NEGATIVE RODS FEW GRAM POSITIVE COCCI IN PAIRS IN CLUSTERS    Culture   Final    FEW Normal respiratory flora-no Staph aureus or Pseudomonas seen Performed at [facility], [address], [place], [place] [address]    Report Status 06/17/2020 FINAL  Final  Aerobic/Anaerobic Culture (surgical/deep wound)     Status: None (Preliminary result)   Collection Time: 06/15/20 12:29 PM   Specimen: Abscess  Result Value Ref Range Status   Specimen Description   Final    ABSCESS Performed at [facility], [address], [place], [place]  [address]    Special Requests   Final    NONE Performed at [facility], [place], [place] [address]    Gram Stain NO ORGANISMS SEEN  Final   Culture   Final    NO GROWTH 2 DAYS Performed at [facility], [address] [place], [place] [address]    Report Status PENDING  Incomplete    Coagulation Studies: No results for input(s): LABPROT, INR in the last 72 hours.  Urinalysis: No results for input(s): COLORURINE, LABSPEC, PHURINE, GLUCOSEU, HGBUR, BILIRUBINUR, KETONESUR, PROTEINUR, UROBILINOGEN, NITRITE, LEUKOCYTESUR in the last 72 hours.  Invalid input(s): APPERANCEUR    Imaging: DG Abd 1 View  Result Date: 06/18/2020 CLINICAL DATA:  Orogastric tube placement EXAM: ABDOMEN - 1 VIEW COMPARISON:  06/17/2020 FINDINGS: Nasogastric tube extends into the distal body of the stomach. Nasoenteric feeding tube extends into the expected juncture of the second and third portion of the duodenum. Percutaneous drainage catheter is again seen overlying the epigastrium. Pelvis excluded from view. Visualized abdominal gas pattern is normal. No gross free intraperitoneal gas. IMPRESSION: Nasoenteric feeding tube tip within the juncture of the second and third portion of the duodenum. Nasogastric tube within the distal stomach. Electronically Signed   By: Fidela Salisbury MD   On: 06/18/2020 02:42   DG Abd 1 View  Result Date: 06/17/2020 CLINICAL DATA:  Orogastric tube placement. EXAM: ABDOMEN - 1 VIEW COMPARISON:  None. FINDINGS: Tip of the weighted enteric tube in the region of the 2/3 portion of the duodenum. Drainage catheter projects over the central abdomen. Patient's hand is in the field of view, shoulder is in a sling. IMPRESSION: Tip of the weighted enteric tube in the region of the 2/3 portion of the duodenum. Electronically Signed   By: Keith Rake M.D.   On: 06/17/2020 20:03   DG Chest Port 1 View  Result Date: 06/18/2020 CLINICAL DATA:  Respiratory failure  EXAM: PORTABLE CHEST 1 VIEW COMPARISON:  06/17/2020 FINDINGS: Endotracheal tube has been advanced and its tip is now seen 2.6 cm above the carina. Nasogastric tube and nasoenteric feeding tube extend into the upper abdomen. Right upper extremity PICC line tip noted within the superior cavoatrial junction. Lung volumes remain extremely small though pulmonary insufflation remain stable since prior examination. The lungs are clear. No pneumothorax or pleural effusion. Cardiac size is within normal limits when accounting for poor pulmonary insufflation. The pulmonary vascularity is normal. IMPRESSION: Support lines and tubes now in appropriate position. Stable pulmonary hypoinflation. Electronically Signed   By: Fidela Salisbury MD   On: 06/18/2020 02:39   DG Chest Port 1 View  Result Date: 06/17/2020 CLINICAL DATA:  Respiratory failure. EXAM: PORTABLE CHEST 1 VIEW COMPARISON:  June 14, 2020 FINDINGS: The ET tube terminates at the thoracic inlet, 7.5 cm above the carina. It has been pulled back a couple cm in the interval. The NG and feeding tubes prior terminate below today's film. A right PICC line is stable. No pneumothorax. The left lung is clear. The cardiomediastinal silhouette is stable. Mild opacity in the right base is more pronounced in the interval. IMPRESSION: 1. The ETT terminates at the thoracic inlet. Recommend advancing 3 cm. 2. Other support apparatus as above. 3. Mild opacity in the right base is more pronounced in the interval and could represent atelectasis or developing infiltrate. Recommend attention on follow-up. Findings discussed with Myra Flowers Electronically Signed   By: Dorise Bullion III M.D   On: 06/17/2020 08:48     Medications:   . dexmedetomidine (PRECEDEX) IV infusion 1 mcg/kg/hr (06/18/20 1000)  . dextrose 150 mL/hr at 06/18/20 1013  . feeding supplement (VITAL 1.5 CAL) 25 mL/hr at 06/18/20 0800  . fentaNYL infusion INTRAVENOUS 150 mcg/hr (06/18/20 1000)  . midazolam 6  mg/hr (06/18/20 1000)  .  piperacillin-tazobactam (ZOSYN)  IV Stopped (06/18/20 0903)   . chlorhexidine gluconate (MEDLINE KIT)  15 mL Mouth Rinse BID  . Chlorhexidine Gluconate Cloth  6 each Topical Daily  . docusate  100 mg Per Tube BID  . enoxaparin (LOVENOX) injection  0.5 mg/kg Subcutaneous Q24H  . feeding supplement (PROSource TF)  45 mL Per Tube TID  . fentaNYL (SUBLIMAZE) injection  50 mcg Intravenous Once  . folic acid  1 mg Intravenous Daily  . mouth rinse  15 mL Mouth Rinse 10 times per day  . metoprolol tartrate  7.5 mg Intravenous Q6H  . multivitamin with minerals  1 tablet Oral Daily  . pantoprazole (PROTONIX) IV  40 mg Intravenous Q24H  . polyethylene glycol  17 g Per Tube Daily  . sodium chloride flush  10-40 mL Intracatheter Q12H  . sodium chloride flush  3 mL Intravenous Q12H  . sodium chloride flush  5 mL Intracatheter Q8H  . thiamine  100 mg Oral Daily   Or  . thiamine  100 mg Intravenous Daily   acetaminophen (TYLENOL) oral liquid 160 mg/5 mL, albuterol, fentaNYL, midazolam, ondansetron (ZOFRAN) IV, sodium chloride flush  Assessment/ Plan:  Marcus Small is a 30 y.o. black male with history of left glenohumeral joint dislocation with greater tuberosity fracture status post left shoulder reduction, hypertension, anxiety, alcohol abuse, depression, who was admitted to [facility] on 06/11/2020 for evaluation of seizure and found to have severe pancreatitis.  1.  Acute kidney injury secondary to acute tubular necrosis. Baseline creatinine of 1.04 with normal GFR on 12/01/2018.  Acute kidney injury secondary to ATN due to severe underlying pancreatitis and IV contrast exposure on 06/15/20.   No indication for dialysis.  Creatinine stable. Nonoliguric urine output.   2.  Hypernatremia. Free water deficit improving with dextrose infusion.   3.  Acute respiratory failure: requiring intubation and mechanical ventilation  4. Anemia with renal failure: hemoglobin 9.5,  no indication for ESA   LOS: 6 Tarshia Kot 3/6/202211:41 AM

## 2020-06-19 ENCOUNTER — Inpatient Hospital Stay: Payer: Self-pay

## 2020-06-19 LAB — CBC
HCT: 26.1 % — ABNORMAL LOW (ref 39.0–52.0)
Hemoglobin: 7.7 g/dL — ABNORMAL LOW (ref 13.0–17.0)
MCH: 30.1 pg (ref 26.0–34.0)
MCHC: 29.5 g/dL — ABNORMAL LOW (ref 30.0–36.0)
MCV: 102 fL — ABNORMAL HIGH (ref 80.0–100.0)
Platelets: 368 10*3/uL (ref 150–400)
RBC: 2.56 MIL/uL — ABNORMAL LOW (ref 4.22–5.81)
RDW: 13.9 % (ref 11.5–15.5)
WBC: 12.2 10*3/uL — ABNORMAL HIGH (ref 4.0–10.5)
nRBC: 0.3 % — ABNORMAL HIGH (ref 0.0–0.2)

## 2020-06-19 LAB — BASIC METABOLIC PANEL
Anion gap: 8 (ref 5–15)
BUN: 9 mg/dL (ref 6–20)
CO2: 26 mmol/L (ref 22–32)
Calcium: 8.7 mg/dL — ABNORMAL LOW (ref 8.9–10.3)
Chloride: 108 mmol/L (ref 98–111)
Creatinine, Ser: 1.49 mg/dL — ABNORMAL HIGH (ref 0.61–1.24)
GFR, Estimated: >60 mL/min (ref >60)
Glucose, Bld: 166 mg/dL — ABNORMAL HIGH (ref 70–99)
Potassium: 3.5 mmol/L (ref 3.5–5.1)
Sodium: 142 mmol/L (ref 135–145)

## 2020-06-19 LAB — RENAL FUNCTION PANEL
Albumin: 2.2 g/dL — ABNORMAL LOW (ref 3.5–5.0)
Anion gap: 7 (ref 5–15)
BUN: 11 mg/dL (ref 6–20)
CO2: 26 mmol/L (ref 22–32)
Calcium: 8.8 mg/dL — ABNORMAL LOW (ref 8.9–10.3)
Chloride: 107 mmol/L (ref 98–111)
Creatinine, Ser: 1.72 mg/dL — ABNORMAL HIGH (ref 0.61–1.24)
GFR, Estimated: 55 mL/min — ABNORMAL LOW (ref >60)
Glucose, Bld: 115 mg/dL — ABNORMAL HIGH (ref 70–99)
Phosphorus: 5.2 mg/dL — ABNORMAL HIGH (ref 2.5–4.6)
Potassium: 4.1 mmol/L (ref 3.5–5.1)
Sodium: 140 mmol/L (ref 135–145)

## 2020-06-19 LAB — HEMOGLOBIN AND HEMATOCRIT, BLOOD
HCT: 32.4 % — ABNORMAL LOW (ref 39.0–52.0)
Hemoglobin: 9.8 g/dL — ABNORMAL LOW (ref 13.0–17.0)

## 2020-06-19 LAB — GLUCOSE, CAPILLARY
Glucose-Capillary: 150 mg/dL — ABNORMAL HIGH (ref 70–99)
Glucose-Capillary: 161 mg/dL — ABNORMAL HIGH (ref 70–99)
Glucose-Capillary: 116 mg/dL — ABNORMAL HIGH (ref 70–99)
Glucose-Capillary: 126 mg/dL — ABNORMAL HIGH (ref 70–99)
Glucose-Capillary: 114 mg/dL — ABNORMAL HIGH (ref 70–99)
Glucose-Capillary: 109 mg/dL — ABNORMAL HIGH (ref 70–99)

## 2020-06-19 LAB — PHOSPHORUS: Phosphorus: 4.4 mg/dL (ref 2.5–4.6)

## 2020-06-19 LAB — FOLATE: Folate: 21.2 ng/mL (ref >5.9)

## 2020-06-19 LAB — AMYLASE: Amylase: 67 U/L (ref 28–100)

## 2020-06-19 LAB — MAGNESIUM
Magnesium: 1.9 mg/dL (ref 1.7–2.4)
Magnesium: 2 mg/dL (ref 1.7–2.4)

## 2020-06-19 LAB — LIPASE, BLOOD: Lipase: 44 U/L (ref 11–51)

## 2020-06-19 LAB — VITAMIN B12: Vitamin B-12: 1361 pg/mL — ABNORMAL HIGH (ref 180–914)

## 2020-06-19 MED ORDER — ALBUMIN HUMAN 25 % IV SOLN
25.0000 g | Freq: Two times a day (BID) | INTRAVENOUS | Status: DC
  Administered 2020-06-19 – 2020-06-20 (×4): 25 g via INTRAVENOUS
  Filled 2020-06-19 (×3): qty 100

## 2020-06-19 MED ORDER — FREE WATER
100.0000 mL | Status: DC
  Administered 2020-06-19 – 2020-06-20 (×6): 100 mL

## 2020-06-19 MED ORDER — POTASSIUM CHLORIDE 10 MEQ/50ML IV SOLN
10.0000 meq | INTRAVENOUS | Status: AC
Start: 2020-06-19 — End: 2020-06-19
  Administered 2020-06-19 (×4): 10 meq via INTRAVENOUS
  Filled 2020-06-19 (×4): qty 50

## 2020-06-19 MED ORDER — HYDROMORPHONE BOLUS VIA INFUSION
0.5000 mg | INTRAVENOUS | Status: DC | PRN
  Administered 2020-06-19 – 2020-06-20 (×8): 0.5 mg via INTRAVENOUS
  Filled 2020-06-19: qty 1

## 2020-06-19 MED ORDER — HYDROMORPHONE HCL 1 MG/ML IJ SOLN
1.0000 mg | Freq: Once | INTRAMUSCULAR | Status: AC
  Administered 2020-06-19: 1 mg via INTRAVENOUS
  Filled 2020-06-19: qty 1

## 2020-06-19 MED ORDER — SODIUM CHLORIDE 0.9 % IV SOLN
0.5000 mg/h | INTRAVENOUS | Status: DC
  Administered 2020-06-19: 1 mg/h via INTRAVENOUS
  Administered 2020-06-19 – 2020-06-20 (×2): 3 mg/h via INTRAVENOUS
  Administered 2020-06-21: 4 mg/h via INTRAVENOUS
  Filled 2020-06-19 (×4): qty 5

## 2020-06-19 MED ORDER — METOCLOPRAMIDE HCL 5 MG/ML IJ SOLN
5.0000 mg | Freq: Three times a day (TID) | INTRAMUSCULAR | Status: DC
  Administered 2020-06-19 – 2020-06-23 (×12): 5 mg via INTRAVENOUS
  Filled 2020-06-19 (×12): qty 2

## 2020-06-19 MED ORDER — ALBUTEROL SULFATE (2.5 MG/3ML) 0.083% IN NEBU
2.5000 mg | INHALATION_SOLUTION | Freq: Four times a day (QID) | RESPIRATORY_TRACT | Status: DC
  Administered 2020-06-19 – 2020-06-21 (×8): 2.5 mg via RESPIRATORY_TRACT
  Filled 2020-06-19 (×8): qty 3

## 2020-06-19 NOTE — Progress Notes (Signed)
[facility]  ROUNDING NOTE   Subjective:   UOP 3139m Creatinine 1.49 (1.62) (1.58) Na 142 D5W at 1550mhr   Objective:  Vital signs in last 24 hours:  Temp:  [99.5 F (37.5 C)-103 F (39.4 C)] 100.22 F (37.9 C) (03/07 0900) Pulse Rate:  [92-104] 92 (03/07 0900) Resp:  [9-21] 9 (03/07 0900) BP: (110-146)/(51-78) 130/67 (03/07 0900) SpO2:  [93 %-100 %] 97 % (03/07 0900) FiO2 (%):  [21 %-30 %] 30 % (03/07 0752) Weight:  [103.1 kg] 103.1 kg (03/07 0449)  Weight change: 0 kg Filed Weights   06/17/20 0323 06/18/20 0432 06/19/20 0449  Weight: 95.1 kg 103.1 kg 103.1 kg    Intake/Output: I/O last 3 completed shifts: In: 8993.3 [I.V.:6822; Other:30; NGFB/PZ:0258IV Piggyback:483.3] Out: 585277 [OEUMP:5361Emesis/NG output:960; Drains:25]   Intake/Output this shift:  Total I/O In: 545.6 [I.V.:428.3; IV Piggyback:117.3] Out: 350 [Urine:350]  Physical Exam: General: Critically ill  Head: ETT   Eyes: PERRL  Neck: trachea midline  Lungs:  PRVC 30%  Heart: Regular rate and rhythm  Abdomen:  Soft  Extremities:  no peripheral edema.  Neurologic: Intubated and sedated  Skin: No lesions  GU: Foley with urine    Basic Metabolic Panel: Recent Labs  Lab 06/15/20 0523 06/16/20 0503 06/17/20 0450 06/18/20 0400 06/19/20 0359  NA 147* 150* 148* 144 142  K 3.9 3.9 3.4* 3.2* 3.5  CL 119* 121* 117* 111 108  CO2 22 23 25 25 26   GLUCOSE 110* 132* 147* 166* 166*  BUN 13 10 9 8 9   CREATININE 1.97* 1.58* 1.62* 1.73* 1.49*  CALCIUM 8.4* 8.3* 8.4* 8.3* 8.7*  MG 2.1 2.0 1.8 1.8 1.9  PHOS 4.0 5.2* 4.9* 4.6 4.4    Liver Function Tests: Recent Labs  Lab 06/14/20 0719 06/15/20 0523 06/16/20 0503 06/17/20 0450 06/18/20 0400  AST 76* 73* 40 28 36  ALT 27 28 27 22 22   ALKPHOS 75 73 91 91 110  BILITOT 2.2* 2.1* 1.6* 1.4* 1.4*  PROT 6.0* 6.3* 6.0* 6.0* 5.9*  ALBUMIN 2.5* 2.3* 2.0* 2.0* 1.9*   Recent Labs  Lab 06/15/20 0523 06/16/20 0503 06/17/20 0450  06/18/20 0400 06/19/20 0359  LIPASE 52* 45 39 38 44  AMYLASE 142* 91 71 64 67   No results for input(s): AMMONIA in the last 168 hours.  CBC: Recent Labs  Lab 06/15/20 0523 06/16/20 0503 06/17/20 0450 06/18/20 0400 06/19/20 0315  WBC 9.5 9.8 11.0* 13.2* 12.2*  HGB 10.5* 10.5* 10.4* 9.5* 7.7*  HCT 34.2* 35.6* 34.0* 31.2* 26.1*  MCV 100.6* 102.6* 100.6* 98.4 102.0*  PLT 255 298 382 448* 368    Cardiac Enzymes: No results for input(s): CKTOTAL, CKMB, CKMBINDEX, TROPONINI in the last 168 hours.  BNP: Invalid input(s): POCBNP  CBG: Recent Labs  Lab 06/18/20 1526 06/18/20 1918 06/18/20 2342 06/19/20 0339 06/19/20 0726  GLUCAP 181* 156* 161* 150* 161*    Microbiology: Results for orders placed or performed during the hospital encounter of 06/11/20  Resp Panel by RT-PCR (Flu A&B, Covid) Nasopharyngeal Swab     Status: None   Collection Time: 06/11/20  9:32 PM   Specimen: Nasopharyngeal Swab; Nasopharyngeal(NP) swabs in vial transport medium  Result Value Ref Range Status   SARS Coronavirus 2 by RT PCR NEGATIVE NEGATIVE Final    Comment: (NOTE) SARS-CoV-2 target nucleic acids are NOT DETECTED.  The SARS-CoV-2 RNA is generally detectable in upper respiratory specimens during the acute phase of infection. The lowest concentration of SARS-CoV-2  viral copies this assay can detect is 138 copies/mL. A negative result does not preclude SARS-Cov-2 infection and should not be used as the sole basis for treatment or other patient management decisions. A negative result may occur with  improper specimen collection/handling, submission of specimen other than nasopharyngeal swab, presence of viral mutation(s) within the areas targeted by this assay, and inadequate number of viral copies(<138 copies/mL). A negative result must be combined with clinical observations, patient history, and epidemiological information. The expected result is Negative.  Fact Sheet for Patients:   EntrepreneurPulse.com.au  Fact Sheet for Healthcare Providers:  IncredibleEmployment.be  This test is no t yet approved or cleared by the [place] FDA and  has been authorized for detection and/or diagnosis of SARS-CoV-2 by FDA under an Emergency Use Authorization (EUA). This EUA will remain  in effect (meaning this test can be used) for the duration of the COVID-19 declaration under Section 564(b)(1) of the Act, 21 U.S.C.section 360bbb-3(b)(1), unless the authorization is terminated  or revoked sooner.       Influenza A by PCR NEGATIVE NEGATIVE Final   Influenza B by PCR NEGATIVE NEGATIVE Final    Comment: (NOTE) The Xpert Xpress SARS-CoV-2/FLU/RSV plus assay is intended as an aid in the diagnosis of influenza from Nasopharyngeal swab specimens and should not be used as a sole basis for treatment. Nasal washings and aspirates are unacceptable for Xpert Xpress SARS-CoV-2/FLU/RSV testing.  Fact Sheet for Patients: EntrepreneurPulse.com.au  Fact Sheet for Healthcare Providers: IncredibleEmployment.be  This test is not yet approved or cleared by the [place] FDA and has been authorized for detection and/or diagnosis of SARS-CoV-2 by FDA under an Emergency Use Authorization (EUA). This EUA will remain in effect (meaning this test can be used) for the duration of the COVID-19 declaration under Section 564(b)(1) of the Act, 21 U.S.C. section 360bbb-3(b)(1), unless the authorization is terminated or revoked.  Performed at [facility], [address] [place], [place] [address]   Blood culture (routine x 2)     Status: None   Collection Time: 06/12/20 12:17 AM   Specimen: BLOOD  Result Value Ref Range Status   Specimen Description BLOOD RIGHT FA  Final   Special Requests   Final    BOTTLES DRAWN AEROBIC AND ANAEROBIC Blood Culture results may not be optimal due to an inadequate volume  of blood received in culture bottles   Culture   Final    NO GROWTH 5 DAYS Performed at [facility], [address] [place], [place] [address]    Report Status 06/17/2020 FINAL  Final  Blood culture (routine x 2)     Status: None   Collection Time: 06/12/20 12:45 AM   Specimen: BLOOD  Result Value Ref Range Status   Specimen Description BLOOD RIGHT HAND  Final   Special Requests   Final    BOTTLES DRAWN AEROBIC AND ANAEROBIC Blood Culture results may not be optimal due to an inadequate volume of blood received in culture bottles   Culture   Final    NO GROWTH 5 DAYS Performed at [facility], [address], [place], [place] [address]    Report Status 06/17/2020 FINAL  Final  MRSA PCR Screening     Status: None   Collection Time: 06/12/20  3:27 AM   Specimen: Nasopharyngeal  Result Value Ref Range Status   MRSA by PCR NEGATIVE NEGATIVE Final    Comment:        The GeneXpert MRSA  Assay (FDA approved for NASAL specimens only), is one component of a comprehensive MRSA colonization surveillance program. It is not intended to diagnose MRSA infection nor to guide or monitor treatment for MRSA infections. Performed at [facility], [place], [place] [address]   Respiratory (~20 pathogens) panel by PCR     Status: None   Collection Time: 06/14/20  5:30 PM   Specimen: Nasopharyngeal Swab; Respiratory  Result Value Ref Range Status   Adenovirus NOT DETECTED NOT DETECTED Final   Coronavirus 229E NOT DETECTED NOT DETECTED Final    Comment: (NOTE) The Coronavirus on the Respiratory Panel, DOES NOT test for the novel  Coronavirus (2019 nCoV)    Coronavirus HKU1 NOT DETECTED NOT DETECTED Final   Coronavirus NL63 NOT DETECTED NOT DETECTED Final   Coronavirus OC43 NOT DETECTED NOT DETECTED Final   Metapneumovirus NOT DETECTED NOT DETECTED Final   Rhinovirus / Enterovirus NOT DETECTED NOT DETECTED Final   Influenza A NOT DETECTED NOT  DETECTED Final   Influenza B NOT DETECTED NOT DETECTED Final   Parainfluenza Virus 1 NOT DETECTED NOT DETECTED Final   Parainfluenza Virus 2 NOT DETECTED NOT DETECTED Final   Parainfluenza Virus 3 NOT DETECTED NOT DETECTED Final   Parainfluenza Virus 4 NOT DETECTED NOT DETECTED Final   Respiratory Syncytial Virus NOT DETECTED NOT DETECTED Final   Bordetella pertussis NOT DETECTED NOT DETECTED Final   Bordetella Parapertussis NOT DETECTED NOT DETECTED Final   Chlamydophila pneumoniae NOT DETECTED NOT DETECTED Final   Mycoplasma pneumoniae NOT DETECTED NOT DETECTED Final    Comment: Performed at [facility], [place], [place], [place] [address]  Culture, Respiratory w Gram Stain     Status: None   Collection Time: 06/14/20  5:46 PM   Specimen: Tracheal Aspirate; Respiratory  Result Value Ref Range Status   Specimen Description   Final    TRACHEAL ASPIRATE Performed at [facility], [address], [place], [place] [address]    Special Requests   Final    NONE Performed at [facility], [address] [place], [place] [address]    Gram Stain   Final    MODERATE WBC PRESENT,BOTH PMN AND MONONUCLEAR MODERATE GRAM NEGATIVE RODS FEW GRAM POSITIVE COCCI IN PAIRS IN CLUSTERS    Culture   Final    FEW Normal respiratory flora-no Staph aureus or Pseudomonas seen Performed at [facility], [address], [place], [place] [address]    Report Status 06/17/2020 FINAL  Final  Aerobic/Anaerobic Culture (surgical/deep wound)     Status: None (Preliminary result)   Collection Time: 06/15/20 12:29 PM   Specimen: Abscess  Result Value Ref Range Status   Specimen Description   Final    ABSCESS Performed at [facility], [address] [place], [place] [address]    Special Requests   Final    NONE Performed at [facility], [place], [place] [address]    Gram Stain NO ORGANISMS SEEN  Final   Culture   Final    NO  GROWTH 3 DAYS Performed at [facility], [place] [address] [place],  [address]    Report Status PENDING  Incomplete  CULTURE, BLOOD (ROUTINE X 2) w Reflex to ID Panel     Status: None (Preliminary result)   Collection Time: 06/18/20  7:47 PM   Specimen: BLOOD  Result Value Ref Range Status   Specimen Description BLOOD  BLOOD LEFT HAND  Final   Special Requests   Final    BOTTLES DRAWN AEROBIC AND ANAEROBIC Blood Culture results may not be optimal due to an excessive volume of blood received in culture bottles   Culture   Final    NO GROWTH < 12 HOURS Performed at [facility], [address] [place], [place] [address]    Report Status PENDING  Incomplete  CULTURE, BLOOD (ROUTINE X 2) w Reflex to ID Panel     Status: None (Preliminary result)   Collection Time: 06/18/20  7:56 PM   Specimen: BLOOD  Result Value Ref Range Status   Specimen Description BLOOD BLOOD RIGHT HAND  Final   Special Requests   Final    BOTTLES DRAWN AEROBIC AND ANAEROBIC Blood Culture results may not be optimal due to an excessive volume of blood received in culture bottles   Culture   Final    NO GROWTH < 12 HOURS Performed at [facility], [address] [place], [place] [address]    Report Status PENDING  Incomplete    Coagulation Studies: No results for input(s): LABPROT, INR in the last 72 hours.  Urinalysis: No results for input(s): COLORURINE, LABSPEC, PHURINE, GLUCOSEU, HGBUR, BILIRUBINUR, KETONESUR, PROTEINUR, UROBILINOGEN, NITRITE, LEUKOCYTESUR in the last 72 hours.  Invalid input(s): APPERANCEUR    Imaging: DG Abd 1 View  Result Date: 06/18/2020 CLINICAL DATA:  Orogastric tube placement EXAM: ABDOMEN - 1 VIEW COMPARISON:  06/17/2020 FINDINGS: Nasogastric tube extends into the distal body of the stomach. Nasoenteric feeding tube extends into the expected juncture of the second and third portion of the duodenum. Percutaneous drainage catheter is again seen  overlying the epigastrium. Pelvis excluded from view. Visualized abdominal gas pattern is normal. No gross free intraperitoneal gas. IMPRESSION: Nasoenteric feeding tube tip within the juncture of the second and third portion of the duodenum. Nasogastric tube within the distal stomach. Electronically Signed   By: Fidela Salisbury MD   On: 06/18/2020 02:42   DG Abd 1 View  Result Date: 06/17/2020 CLINICAL DATA:  Orogastric tube placement. EXAM: ABDOMEN - 1 VIEW COMPARISON:  None. FINDINGS: Tip of the weighted enteric tube in the region of the 2/3 portion of the duodenum. Drainage catheter projects over the central abdomen. Patient's hand is in the field of view, shoulder is in a sling. IMPRESSION: Tip of the weighted enteric tube in the region of the 2/3 portion of the duodenum. Electronically Signed   By: Keith Rake M.D.   On: 06/17/2020 20:03   DG Chest Port 1 View  Result Date: 06/18/2020 CLINICAL DATA:  Respiratory failure EXAM: PORTABLE CHEST 1 VIEW COMPARISON:  06/17/2020 FINDINGS: Endotracheal tube has been advanced and its tip is now seen 2.6 cm above the carina. Nasogastric tube and nasoenteric feeding tube extend into the upper abdomen. Right upper extremity PICC line tip noted within the superior cavoatrial junction. Lung volumes remain extremely small though pulmonary insufflation remain stable since prior examination. The lungs are clear. No pneumothorax or pleural effusion. Cardiac size is within normal limits when accounting for poor pulmonary insufflation. The pulmonary vascularity is normal. IMPRESSION: Support lines and tubes now in appropriate position. Stable pulmonary hypoinflation. Electronically Signed   By: Fidela Salisbury MD   On: 06/18/2020 02:39     Medications:   . dexmedetomidine (PRECEDEX) IV infusion 1.2 mcg/kg/hr (06/19/20 0900)  . dextrose 150 mL/hr at 06/19/20 0900  . feeding supplement (VITAL 1.5 CAL) Stopped (06/18/20 1645)  .  HYDROmorphone 2 mg/hr (06/19/20 0900)   . midazolam 7 mg/hr (06/19/20 0900)  . piperacillin-tazobactam (ZOSYN)  IV 12.5 mL/hr at 06/19/20 0900  . potassium chloride 50 mL/hr at 06/19/20 0900   . chlorhexidine gluconate (MEDLINE KIT)  15 mL Mouth Rinse BID  . Chlorhexidine Gluconate Cloth  6 each Topical Daily  . docusate  100 mg Per Tube BID  . enoxaparin (LOVENOX) injection  0.5 mg/kg Subcutaneous Q24H  . feeding supplement (PROSource TF)  45 mL Per Tube TID  . fentaNYL (SUBLIMAZE) injection  50 mcg Intravenous Once  . folic acid  1 mg Intravenous Daily  . mouth rinse  15 mL Mouth Rinse 10 times per day  . metoprolol tartrate  7.5 mg Intravenous Q6H  . multivitamin with minerals  1 tablet Oral Daily  . pantoprazole (PROTONIX) IV  40 mg Intravenous Q24H  . polyethylene glycol  17 g Per Tube Daily  . sodium chloride flush  10-40 mL Intracatheter Q12H  . sodium chloride flush  3 mL Intravenous Q12H  . sodium chloride flush  5 mL Intracatheter Q8H  . thiamine  100 mg Oral Daily   Or  . thiamine  100 mg Intravenous Daily   acetaminophen (TYLENOL) oral liquid 160 mg/5 mL, albuterol, HYDROmorphone, LORazepam, midazolam, ondansetron (ZOFRAN) IV, sodium chloride flush, vecuronium  Assessment/ Plan:  Marcus Small is a 30 y.o. black male with history of left glenohumeral joint dislocation with greater tuberosity fracture status post left shoulder reduction, hypertension, anxiety, alcohol abuse, depression, who was admitted to [facility] on 06/11/2020 for evaluation of seizure and found to have severe pancreatitis.  1.  Acute kidney injury secondary to acute tubular necrosis. Baseline creatinine of 1.04 with normal GFR on 12/01/2018.  Acute kidney injury secondary to ATN due to severe underlying pancreatitis and IV contrast exposure on 06/15/20.   No indication for dialysis.  Creatinine stable. Nonoliguric urine output.   2.  Hypernatremia. Free water deficit improving with dextrose infusion.   3.  Acute respiratory failure:  requiring intubation and mechanical ventilation  4. Anemia with renal failure: hemoglobin 7.7, no indication for ESA   LOS: 7 Sarath Kolluru 3/7/20229:08 AM

## 2020-06-19 NOTE — Progress Notes (Addendum)
Nutrition Follow Up Note   DOCUMENTATION CODES:   Not applicable  INTERVENTION:   Initiate Vital 1.5 Cal at 15 mL/hr- once pt is tolerating, recommend advance by 10 mL/hr every 8 hours to goal rate of 55 mL/hr  Provide PROSource TF 45 mL TID per tube  Goal regimen provides 2100 kcal, 122 grams of protein, 1003 mL H2O daily  NUTRITION DIAGNOSIS:   Inadequate oral intake related to inability to eat as evidenced by NPO status.  GOAL:   Patient will meet greater than or equal to 90% of their needs  -not met   MONITOR:   Vent status,Labs,Weight trends,TF tolerance,I & O's  ASSESSMENT:   30 year old male with PMHx of HTN, anxiety, EtOH abuse admitted with left glenohumeral joint dislocation with greater tuberosity fracture s/p left shoulder reduction in ER, pancreatitis, seizure activity secondary to severe EtOH withdrawal.   Pt s/p IR guided post-pyloric nasogastric tube and drain 3/3  Pt remains sedated and ventilated. OGT in place to LIS with 373m output. NGT in place in distal duodenum. Tube feeds being held as pt with one episode of vomiting and distended abdomen. Pt s/p abdomen CT scan today; there is no indication to stop tubes feeds. Recommend restarting tube feeds at trickle rate and advance as tolerated.   Medications reviewed and include: colace, lovenox, folic acid, MVI, protonix, miralax, thiamine, precedex, hydromorphone, zosyn  Labs reviewed: K 3.5 wnl, creat 1.49(H), P 4.4 wnl, Mg 1.9 wnl Wbc- 12.2(H), Hgb 7.7(L), Hct 26.2(L)  Patient is currently intubated on ventilator support MV: 8.6 L/min Temp (24hrs), Avg:100.6 F (38.1 C), Min:99.5 F (37.5 C), Max:102.1 F (38.9 C)  MAP- >650mg  UOP- 315065m JP drain with 49m34mtput  Diet Order:   Diet Order            Diet NPO time specified  Diet effective now                EDUCATION NEEDS:   No education needs have been identified at this time  Skin:  Skin Assessment: Reviewed RN  Assessment  Last BM:  Unknown/PTA  Height:   Ht Readings from Last 1 Encounters:  06/17/20 _0  (1.651 m)   Weight:   Wt Readings from Last 1 Encounters:  06/19/20 103.1 kg   Ideal Body Weight:  61.8 kg  BMI:  Body mass index is 37.82 kg/m.  Estimated Nutritional Needs:   Kcal:  2076  Protein:  115-130 grams  Fluid:  >/= 2 L/day  CaseKoleen Distance RD, LDN Please refer to [facility] and/or RD on-call/weekend/after hours pager

## 2020-06-19 NOTE — Progress Notes (Signed)
Follow up - Critical Care Medicine Note  Patient Details:    Marcus Small is an 30 y.o. male admitted with left glenohumeral joint fracture/dislocation with greater pt s/p left shoulder reduction in ER; pancreatitis; and seizure activity secondary to severe ETOH withdrawal requiring precedex gtt.  Required intubation due to progressive encephalopathy due to alcohol withdrawal.  Lines, Airways, Drains: Airway 8 mm (Active)  Secured at (cm) 25 cm 06/19/20 1107  Measured From Lips 06/19/20 1107  Secured Location Left 06/19/20 1107  Secured By [employer] 06/19/20 1107  Tube Holder Repositioned Yes 06/19/20 1107  Prone position No 06/19/20 1107  Cuff Pressure (cm H2O) 28 cm H2O 06/19/20 0752  Site Condition Cool;Dry 06/19/20 1107     PICC Triple Lumen 65/03/54 PICC Right Basilic 40 cm 0 cm (Active)  Indication for Insertion or Continuance of Line Prolonged intravenous therapies 06/19/20 0800  Exposed Catheter (cm) 0 cm 06/19/20 0200  Site Assessment Clean;Dry;Intact 06/19/20 0800  Lumen #1 Status Infusing;Flushed 06/19/20 0800  Lumen #2 Status Infusing;Flushed 06/19/20 0800  Lumen #3 Status Infusing;Flushed 06/19/20 0800  Dressing Type Transparent 06/19/20 0800  Dressing Status Clean;Dry;Intact 06/19/20 0800  Antimicrobial disc in place? Yes 06/19/20 0800  Safety Lock Not Applicable 65/68/12 7517  Line Care Connections checked and tightened 06/19/20 0800  Line Adjustment (NICU/IV Team Only) No 06/19/20 0200  Dressing Intervention Other (Comment) 06/19/20 0200  Dressing Change Due 06/21/20 06/19/20 0800     Biliary Tube VTCB biliary 12 Fr. LUQ (Active)  Site Description Unable to view 06/19/20 0800  Dressing Status Clean;Dry;Intact 06/19/20 0800  Drainage Color Serous 06/19/20 0800  Tube Status Patent;Open to gravity drainage 06/19/20 0800  Intake (mL) 5 ml 06/18/20 2000  Output (mL) 5 mL 06/19/20 0400     NG/OG Tube Orogastric 16 Fr. Center mouth Xray Documented  cm marking at nare/ corner of mouth 56 cm (Active)  Cm Marking at Nare/Corner of Mouth (if applicable) 60 cm 00/17/49 0800  Site Assessment Clean;Dry;Intact 06/19/20 0800  Ongoing Placement Verification No change in cm markings or external length of tube from initial placement;No change in respiratory status;No acute changes, not attributed to clinical condition 06/19/20 0800  Status Suction-low intermittent 06/19/20 0800  Drainage Appearance Green 06/19/20 0800  Intake (mL) 0 mL 06/19/20 0000  Output (mL) 50 mL 06/19/20 1047     NG/OG Tube Other (Comment) 10 Fr. Right nare Xray Documented cm marking at nare/ corner of mouth 72 cm (Active)  Cm Marking at Nare/Corner of Mouth (if applicable) 75 cm 44/96/75 0800  Site Assessment Clean;Dry;Intact 06/19/20 0800  Ongoing Placement Verification No change in cm markings or external length of tube from initial placement;No change in respiratory status;No acute changes, not attributed to clinical condition 06/19/20 0800  Status Clamped 06/19/20 0800  Drainage Appearance None 06/19/20 0800  Intake (mL) 0 mL 06/19/20 1047  Output (mL) 0 mL 06/19/20 1047     Urethral Catheter Nancy Fetter, RN Straight-tip (Active)  Indication for Insertion or Continuance of Catheter Therapy based on hourly urine output monitoring and documentation for critical condition (NOT STRICT I&O) 06/19/20 0800  Site Assessment Clean;Intact 06/19/20 0800  Catheter Maintenance Bag below level of bladder;Catheter secured;Drainage bag/tubing not touching floor;Insertion date on drainage bag;No dependent loops;Seal intact 06/19/20 0800  Collection Container Standard drainage bag 06/19/20 0800  Securement Method Securing device (Describe) 06/19/20 0800  Urinary Catheter Interventions (if applicable) Unclamped 91/63/84 0800  Input (mL) 0 mL 06/18/20 1800  Output (mL) 350 mL  06/19/20 0900    Anti-infectives:  Anti-infectives (From admission, onward)   Start     Dose/Rate Route  Frequency Ordered Stop   06/14/20 1100  piperacillin-tazobactam (ZOSYN) IVPB 3.375 g        3.375 g 12.5 mL/hr over 240 Minutes Intravenous Every 8 hours 06/14/20 1009     06/12/20 0015  vancomycin (VANCOCIN) IVPB 1000 mg/200 mL premix       "Followed by" Linked Group Details   1,000 mg 200 mL/hr over 60 Minutes Intravenous  Once 06/12/20 0002 06/12/20 0313   06/12/20 0015  vancomycin (VANCOREADY) IVPB 1250 mg/250 mL       "Followed by" Linked Group Details   1,250 mg 166.7 mL/hr over 90 Minutes Intravenous  Once 06/12/20 0002 06/12/20 0631   06/12/20 0000  ceFEPIme (MAXIPIME) 2 g in sodium chloride 0.9 % 100 mL IVPB        2 g 200 mL/hr over 30 Minutes Intravenous  Once 06/11/20 2352 06/12/20 0115   06/12/20 0000  metroNIDAZOLE (FLAGYL) IVPB 500 mg        500 mg 100 mL/hr over 60 Minutes Intravenous  Once 06/11/20 2352 06/12/20 0232   06/12/20 0000  vancomycin (VANCOCIN) IVPB 1000 mg/200 mL premix  Status:  Discontinued        1,000 mg 200 mL/hr over 60 Minutes Intravenous  Once 06/11/20 2352 06/12/20 0000     Scheduled Meds: . chlorhexidine gluconate (MEDLINE KIT)  15 mL Mouth Rinse BID  . Chlorhexidine Gluconate Cloth  6 each Topical Daily  . docusate  100 mg Per Tube BID  . enoxaparin (LOVENOX) injection  0.5 mg/kg Subcutaneous Q24H  . feeding supplement (PROSource TF)  45 mL Per Tube TID  . fentaNYL (SUBLIMAZE) injection  50 mcg Intravenous Once  . folic acid  1 mg Intravenous Daily  . free water  100 mL Per Tube Q4H  . mouth rinse  15 mL Mouth Rinse 10 times per day  . metoCLOPramide (REGLAN) injection  5 mg Intravenous Q8H  . metoprolol tartrate  7.5 mg Intravenous Q6H  . multivitamin with minerals  1 tablet Oral Daily  . pantoprazole (PROTONIX) IV  40 mg Intravenous Q24H  . polyethylene glycol  17 g Per Tube Daily  . sodium chloride flush  10-40 mL Intracatheter Q12H  . sodium chloride flush  3 mL Intravenous Q12H  . sodium chloride flush  5 mL Intracatheter Q8H  .  thiamine  100 mg Oral Daily   Or  . thiamine  100 mg Intravenous Daily   Continuous Infusions: . albumin human    . dexmedetomidine (PRECEDEX) IV infusion 1.2 mcg/kg/hr (06/19/20 1106)  . feeding supplement (VITAL 1.5 CAL) 1,000 mL (06/19/20 1215)  . HYDROmorphone 2.5 mg/hr (06/19/20 1123)  . midazolam 9 mg/hr (06/19/20 1122)  . piperacillin-tazobactam (ZOSYN)  IV Stopped (06/19/20 0929)   PRN Meds:.acetaminophen (TYLENOL) oral liquid 160 mg/5 mL, albuterol, HYDROmorphone, LORazepam, midazolam, ondansetron (ZOFRAN) IV, sodium chloride flush, vecuronium   Microbiology: Results for orders placed or performed during the hospital encounter of 06/11/20  Resp Panel by RT-PCR (Flu A&B, Covid) Nasopharyngeal Swab     Status: None   Collection Time: 06/11/20  9:32 PM   Specimen: Nasopharyngeal Swab; Nasopharyngeal(NP) swabs in vial transport medium  Result Value Ref Range Status   SARS Coronavirus 2 by RT PCR NEGATIVE NEGATIVE Final    Comment: (NOTE) SARS-CoV-2 target nucleic acids are NOT DETECTED.  The SARS-CoV-2 RNA is generally detectable in upper  respiratory specimens during the acute phase of infection. The lowest concentration of SARS-CoV-2 viral copies this assay can detect is 138 copies/mL. A negative result does not preclude SARS-Cov-2 infection and should not be used as the sole basis for treatment or other patient management decisions. A negative result may occur with  improper specimen collection/handling, submission of specimen other than nasopharyngeal swab, presence of viral mutation(s) within the areas targeted by this assay, and inadequate number of viral copies(<138 copies/mL). A negative result must be combined with clinical observations, patient history, and epidemiological information. The expected result is Negative.  Fact Sheet for Patients:  EntrepreneurPulse.com.au  Fact Sheet for Healthcare Providers:   IncredibleEmployment.be  This test is no t yet approved or cleared by the [place] FDA and  has been authorized for detection and/or diagnosis of SARS-CoV-2 by FDA under an Emergency Use Authorization (EUA). This EUA will remain  in effect (meaning this test can be used) for the duration of the COVID-19 declaration under Section 564(b)(1) of the Act, 21 U.S.C.section 360bbb-3(b)(1), unless the authorization is terminated  or revoked sooner.       Influenza A by PCR NEGATIVE NEGATIVE Final   Influenza B by PCR NEGATIVE NEGATIVE Final    Comment: (NOTE) The Xpert Xpress SARS-CoV-2/FLU/RSV plus assay is intended as an aid in the diagnosis of influenza from Nasopharyngeal swab specimens and should not be used as a sole basis for treatment. Nasal washings and aspirates are unacceptable for Xpert Xpress SARS-CoV-2/FLU/RSV testing.  Fact Sheet for Patients: EntrepreneurPulse.com.au  Fact Sheet for Healthcare Providers: IncredibleEmployment.be  This test is not yet approved or cleared by the [place] FDA and has been authorized for detection and/or diagnosis of SARS-CoV-2 by FDA under an Emergency Use Authorization (EUA). This EUA will remain in effect (meaning this test can be used) for the duration of the COVID-19 declaration under Section 564(b)(1) of the Act, 21 U.S.C. section 360bbb-3(b)(1), unless the authorization is terminated or revoked.  Performed at [facility] [place], [address] [place], [place] [address]   Blood culture (routine x 2)     Status: None   Collection Time: 06/12/20 12:17 AM   Specimen: BLOOD  Result Value Ref Range Status   Specimen Description BLOOD RIGHT FA  Final   Special Requests   Final    BOTTLES DRAWN AEROBIC AND ANAEROBIC Blood Culture results may not be optimal due to an inadequate volume of blood received in culture bottles   Culture   Final    NO GROWTH 5  DAYS Performed at [facility], [address] [place], [place] [address]    Report Status 06/17/2020 FINAL  Final  Blood culture (routine x 2)     Status: None   Collection Time: 06/12/20 12:45 AM   Specimen: BLOOD  Result Value Ref Range Status   Specimen Description BLOOD RIGHT HAND  Final   Special Requests   Final    BOTTLES DRAWN AEROBIC AND ANAEROBIC Blood Culture results may not be optimal due to an inadequate volume of blood received in culture bottles   Culture   Final    NO GROWTH 5 DAYS Performed at [facility], [address] [place], [place] [address]    Report Status 06/17/2020 FINAL  Final  MRSA PCR Screening     Status: None   Collection Time: 06/12/20  3:27 AM   Specimen: Nasopharyngeal  Result Value Ref Range Status   MRSA by PCR NEGATIVE NEGATIVE Final  Comment:        The GeneXpert MRSA Assay (FDA approved for NASAL specimens only), is one component of a comprehensive MRSA colonization surveillance program. It is not intended to diagnose MRSA infection nor to guide or monitor treatment for MRSA infections. Performed at [facility], [place], [place] [address]   Respiratory (~20 pathogens) panel by PCR     Status: None   Collection Time: 06/14/20  5:30 PM   Specimen: Nasopharyngeal Swab; Respiratory  Result Value Ref Range Status   Adenovirus NOT DETECTED NOT DETECTED Final   Coronavirus 229E NOT DETECTED NOT DETECTED Final    Comment: (NOTE) The Coronavirus on the Respiratory Panel, DOES NOT test for the novel  Coronavirus (2019 nCoV)    Coronavirus HKU1 NOT DETECTED NOT DETECTED Final   Coronavirus NL63 NOT DETECTED NOT DETECTED Final   Coronavirus OC43 NOT DETECTED NOT DETECTED Final   Metapneumovirus NOT DETECTED NOT DETECTED Final   Rhinovirus / Enterovirus NOT DETECTED NOT DETECTED Final   Influenza A NOT DETECTED NOT DETECTED Final   Influenza B NOT DETECTED NOT DETECTED Final    Parainfluenza Virus 1 NOT DETECTED NOT DETECTED Final   Parainfluenza Virus 2 NOT DETECTED NOT DETECTED Final   Parainfluenza Virus 3 NOT DETECTED NOT DETECTED Final   Parainfluenza Virus 4 NOT DETECTED NOT DETECTED Final   Respiratory Syncytial Virus NOT DETECTED NOT DETECTED Final   Bordetella pertussis NOT DETECTED NOT DETECTED Final   Bordetella Parapertussis NOT DETECTED NOT DETECTED Final   Chlamydophila pneumoniae NOT DETECTED NOT DETECTED Final   Mycoplasma pneumoniae NOT DETECTED NOT DETECTED Final    Comment: Performed at [facility], [place]. [address] [place], [place] [address]  Culture, Respiratory w Gram Stain     Status: None   Collection Time: 06/14/20  5:46 PM   Specimen: Tracheal Aspirate; Respiratory  Result Value Ref Range Status   Specimen Description   Final    TRACHEAL ASPIRATE Performed at [facility], [address], [place], [place] [address]    Special Requests   Final    NONE Performed at [facility], [address] [place], [place] [address]    Gram Stain   Final    MODERATE WBC PRESENT,BOTH PMN AND MONONUCLEAR MODERATE GRAM NEGATIVE RODS FEW GRAM POSITIVE COCCI IN PAIRS IN CLUSTERS    Culture   Final    FEW Normal respiratory flora-no Staph aureus or Pseudomonas seen Performed at [facility], [address], [place], [place] [address]    Report Status 06/17/2020 FINAL  Final  Aerobic/Anaerobic Culture (surgical/deep wound)     Status: None (Preliminary result)   Collection Time: 06/15/20 12:29 PM   Specimen: Abscess  Result Value Ref Range Status   Specimen Description   Final    ABSCESS Performed at [facility], [address] [place], [place] [address]    Special Requests   Final    NONE Performed at [facility], [place], [place] [address]    Gram Stain NO ORGANISMS SEEN  Final   Culture   Final    NO GROWTH 3 DAYS Performed at [facility], [place] [address] [place], [place] [address]    Report Status PENDING  Incomplete  CULTURE, BLOOD (ROUTINE X 2) w Reflex to ID Panel     Status: None (Preliminary result)   Collection Time: 06/18/20  7:47 PM   Specimen: BLOOD  Result Value Ref Range Status   Specimen Description BLOOD BLOOD LEFT HAND  Final   Special Requests   Final    BOTTLES DRAWN AEROBIC AND ANAEROBIC Blood Culture results may not be optimal due to an excessive volume of blood received in culture bottles   Culture   Final    NO GROWTH < 12 HOURS Performed at [facility], [address] [place], [place] [address]    Report Status PENDING  Incomplete  CULTURE, BLOOD (ROUTINE X 2) w Reflex to ID Panel     Status: None (Preliminary result)   Collection Time: 06/18/20  7:56 PM   Specimen: BLOOD  Result Value Ref Range Status   Specimen Description BLOOD BLOOD RIGHT HAND  Final   Special Requests   Final    BOTTLES DRAWN AEROBIC AND ANAEROBIC Blood Culture results may not be optimal due to an excessive volume of blood received in culture bottles   Culture   Final    NO GROWTH < 12 HOURS Performed at [facility], [address] [place], [place] [address]    Report Status PENDING  Incomplete   Results for orders placed or performed during the hospital encounter of 06/11/20 (from the past 24 hour(s))  Glucose, capillary     Status: Abnormal   Collection Time: 06/18/20  3:26 PM  Result Value Ref Range   Glucose-Capillary 181 (H) 70 - 99 mg/dL  Glucose, capillary     Status: Abnormal   Collection Time: 06/18/20  7:18 PM  Result Value Ref Range   Glucose-Capillary 156 (H) 70 - 99 mg/dL  CULTURE, BLOOD (ROUTINE X 2) w Reflex to ID Panel     Status: None (Preliminary result)   Collection Time: 06/18/20  7:47 PM   Specimen: BLOOD  Result Value Ref Range   Specimen Description BLOOD BLOOD LEFT HAND    Special Requests      BOTTLES DRAWN AEROBIC AND ANAEROBIC Blood Culture results may not be optimal due to an  excessive volume of blood received in culture bottles   Culture      NO GROWTH < 12 HOURS Performed at [facility], [address] [place], [place] [address]    Report Status PENDING   CULTURE, BLOOD (ROUTINE X 2) w Reflex to ID Panel     Status: None (Preliminary result)   Collection Time: 06/18/20  7:56 PM   Specimen: BLOOD  Result Value Ref Range   Specimen Description BLOOD BLOOD RIGHT HAND    Special Requests      BOTTLES DRAWN AEROBIC AND ANAEROBIC Blood Culture results may not be optimal due to an excessive volume of blood received in culture bottles   Culture      NO GROWTH < 12 HOURS Performed at [facility], [address] [place], [place] [address]    Report Status PENDING   Glucose, capillary     Status: Abnormal   Collection Time: 06/18/20 11:42 PM  Result Value Ref Range   Glucose-Capillary 161 (H) 70 - 99 mg/dL  CBC     Status: Abnormal   Collection Time: 06/19/20  3:15 AM  Result Value Ref Range   WBC 12.2 (H) 4.0 - 10.5 K/uL   RBC 2.56 (L) 4.22 - 5.81 MIL/uL   Hemoglobin 7.7 (L) 13.0 - 17.0 g/dL   HCT 26.1 (L) 39.0 - 52.0 %   MCV 102.0 (H) 80.0 - 100.0 fL   MCH 30.1 26.0 - 34.0 pg  MCHC 29.5 (L) 30.0 - 36.0 g/dL   RDW 13.9 11.5 - 15.5 %   Platelets 368 150 - 400 K/uL   nRBC 0.3 (H) 0.0 - 0.2 %  Glucose, capillary     Status: Abnormal   Collection Time: 06/19/20  3:39 AM  Result Value Ref Range   Glucose-Capillary 150 (H) 70 - 99 mg/dL  Amylase     Status: None   Collection Time: 06/19/20  3:59 AM  Result Value Ref Range   Amylase 67 28 - 100 U/L  Basic metabolic panel     Status: Abnormal   Collection Time: 06/19/20  3:59 AM  Result Value Ref Range   Sodium 142 135 - 145 mmol/L   Potassium 3.5 3.5 - 5.1 mmol/L   Chloride 108 98 - 111 mmol/L   CO2 26 22 - 32 mmol/L   Glucose, Bld 166 (H) 70 - 99 mg/dL   BUN 9 6 - 20 mg/dL   Creatinine, Ser 1.49 (H) 0.61 - 1.24 mg/dL   Calcium 8.7 (L) 8.9 - 10.3 mg/dL   GFR, Estimated >60  >60 mL/min   Anion gap 8 5 - 15  Lipase, blood     Status: None   Collection Time: 06/19/20  3:59 AM  Result Value Ref Range   Lipase 44 11 - 51 U/L  Magnesium     Status: None   Collection Time: 06/19/20  3:59 AM  Result Value Ref Range   Magnesium 1.9 1.7 - 2.4 mg/dL  Phosphorus     Status: None   Collection Time: 06/19/20  3:59 AM  Result Value Ref Range   Phosphorus 4.4 2.5 - 4.6 mg/dL  Glucose, capillary     Status: Abnormal   Collection Time: 06/19/20  7:26 AM  Result Value Ref Range   Glucose-Capillary 161 (H) 70 - 99 mg/dL  Glucose, capillary     Status: Abnormal   Collection Time: 06/19/20 11:12 AM  Result Value Ref Range   Glucose-Capillary 116 (H) 70 - 99 mg/dL    Best Practice/Protocols:  VTE Prophylaxis: Lovenox (prophylaxtic dose) GI Prophylaxis: Proton Pump Inhibitor Continous Sedation Hyperglycemia (ICU)  Events 02/28severe ETOH withdrawal, severe pancreatitis 3/1 severe pancreatitis 3/2 increased WOB and SOB, severe DT's, patient emergently intubated 3/2 PICC line placed 3/3 remains intubated, JP drain placed into 9cm fluid collection by IR 3/3 IR placed dubhoff tube, started trickle feeds 3/4 remains on vent, severe pancreatitis 3/5 OG suction c/w TF's, severe abd distention 3/6 continue vent dependence, anasarca, abdominal distention.  Start albumin, Reglan, restart feeds.    Studies: CT ABDOMEN PELVIS WO CONTRAST  Result Date: 06/19/2020 CLINICAL DATA:  Peritonitis or perforations suspected. Follow-up of pancreatitis. Status post drainage of peripancreatic fluid collection 06/15/2020. EXAM: CT ABDOMEN AND PELVIS WITHOUT CONTRAST TECHNIQUE: Multidetector CT imaging of the abdomen and pelvis was performed following the standard protocol without IV contrast. COMPARISON:  06/15/2020 abdominopelvic CT. FINDINGS: Lower chest: Left greater than right base atelectasis. Normal heart size with similar small left pleural effusion. Hepatobiliary: Multifactorial  degradation, including arm position, lack of IV contrast. Grossly normal noncontrast appearance of the liver, gallbladder, biliary tract. Pancreas: Moderate pancreatitis is similar, as evidenced by enlargement of and edema surrounding the pancreas diffusely. Percutaneous drain within the previously described collection in the gastrocolic ligament, which has resolved. No new well-defined fluid collection, given above limitations. Spleen: Grossly normal spleen. Adrenals/Urinary Tract: Normal adrenal glands. No renal calculi or hydronephrosis. No hydroureter or ureteric calculi. Foley catheter within  the urinary bladder. Stomach/Bowel: Feeding tube terminates in the transverse duodenum. Normal colon, appendix, and terminal ileum. Otherwise normal small bowel. Vascular/Lymphatic: Normal caliber of the aorta and branch vessels. Mildly prominent abdominal retroperitoneal nodes are likely reactive. No pelvic adenopathy. Reproductive: Normal prostate. Other: Small volume abdominopelvic fluid, minimally decreased. No free intraperitoneal air. Anasarca. Presumably iatrogenic gas within the anterior abdominal wall subcutaneous tissues. Musculoskeletal: No acute osseous abnormality. IMPRESSION: 1. Multifactorial degradation, arm position, and lack of IV contrast. 2. Similar moderate pancreatitis. Resolution of previously described gastrocolic ligament fluid collection. 3. Decreased small volume abdominopelvic fluid. 4. Similar small left pleural effusion with bibasilar atelectasis. Electronically Signed   By: Abigail Miyamoto M.D.   On: 06/19/2020 10:17   DG Chest 1 View  Result Date: 06/14/2020 CLINICAL DATA:  Endotracheal tube placement. EXAM: CHEST  1 VIEW COMPARISON:  June 11, 2020. FINDINGS: Stable cardiomegaly. Endotracheal and nasogastric tubes are unchanged in position. No pneumothorax is noted. Mild bilateral patchy airspace opacities are noted concerning for multifocal pneumonia. Bony thorax is unremarkable.  IMPRESSION: Stable support apparatus. Mild bilateral patchy airspace opacities are noted concerning for multifocal pneumonia. Electronically Signed   By: Marijo Conception M.D.   On: 06/14/2020 14:28   DG Abd 1 View  Result Date: 06/18/2020 CLINICAL DATA:  Orogastric tube placement EXAM: ABDOMEN - 1 VIEW COMPARISON:  06/17/2020 FINDINGS: Nasogastric tube extends into the distal body of the stomach. Nasoenteric feeding tube extends into the expected juncture of the second and third portion of the duodenum. Percutaneous drainage catheter is again seen overlying the epigastrium. Pelvis excluded from view. Visualized abdominal gas pattern is normal. No gross free intraperitoneal gas. IMPRESSION: Nasoenteric feeding tube tip within the juncture of the second and third portion of the duodenum. Nasogastric tube within the distal stomach. Electronically Signed   By: Fidela Salisbury MD   On: 06/18/2020 02:42   DG Abd 1 View  Result Date: 06/17/2020 CLINICAL DATA:  Orogastric tube placement. EXAM: ABDOMEN - 1 VIEW COMPARISON:  None. FINDINGS: Tip of the weighted enteric tube in the region of the 2/3 portion of the duodenum. Drainage catheter projects over the central abdomen. Patient's hand is in the field of view, shoulder is in a sling. IMPRESSION: Tip of the weighted enteric tube in the region of the 2/3 portion of the duodenum. Electronically Signed   By: Keith Rake M.D.   On: 06/17/2020 20:03   DG Abd 1 View  Result Date: 06/15/2020 CLINICAL DATA:  Enteric catheter placement EXAM: ABDOMEN - 1 VIEW COMPARISON:  06/12/2020, 06/15/2020 FINDINGS: Frontal view of the lower chest and upper abdomen demonstrates weighted tip of an enteric feeding catheter along the expected course of the duodenum, tip in the region of the ligament of Treitz. A second enteric catheter tip and side port project over the gastric fundus. There is a pigtail drainage catheter within the central upper abdomen unchanged. Paucity of bowel  gas, with oral contrast seen within the ascending colon. IMPRESSION: 1. Enteric catheters as above, with the weighted tip of a feeding catheter in the region of the duodenal-jejunal junction. Electronically Signed   By: Randa Ngo M.D.   On: 06/15/2020 22:03   DG Abd 1 View  Result Date: 06/12/2020 CLINICAL DATA:  NG tube placement EXAM: ABDOMEN - 1 VIEW COMPARISON:  CT 05/22/2020 FINDINGS: Esophageal tube tip projects over the gastric outlet. Bowel gas pattern is unobstructed. IMPRESSION: Esophageal tube tip projects over the gastric outlet. Electronically Signed   By:  Donavan Foil M.D.   On: 06/12/2020 19:03   CT Head Wo Contrast  Result Date: 06/11/2020 CLINICAL DATA:  Seizure post alcohol withdrawal EXAM: CT CERVICAL SPINE WITHOUT CONTRAST TECHNIQUE: Multidetector CT imaging of the cervical spine was performed without intravenous contrast. Multiplanar CT image reconstructions were also generated. COMPARISON:  None. FINDINGS: Brain: No evidence of acute territorial infarction, hemorrhage, hydrocephalus,extra-axial collection or mass lesion/mass effect. Normal gray-white differentiation. Ventricles are normal in size and contour. Vascular: No hyperdense vessel or unexpected calcification. Skull: The skull is intact. No fracture or focal lesion identified. Sinuses/Orbits: The visualized paranasal sinuses and mastoid air cells are clear. The orbits and globes intact. Other: None Cervical spine: Alignment: Physiologic Skull base and vertebrae: Visualized skull base is intact. No atlanto-occipital dissociation. The vertebral body heights are well maintained. No fracture or pathologic osseous lesion seen. Soft tissues and spinal canal: The visualized paraspinal soft tissues are unremarkable. No prevertebral soft tissue swelling is seen. The spinal canal is grossly unremarkable, no large epidural collection or significant canal narrowing. Disc levels:  No significant canal or neural foraminal narrowing.  Upper chest: The lung apices are clear. Thoracic inlet is within normal limits. Other: None IMPRESSION: No acute intracranial abnormality. No acute fracture or malalignment of the spine. Electronically Signed   By: Prudencio Pair M.D.   On: 06/11/2020 23:41   CT Cervical Spine Wo Contrast  Result Date: 06/11/2020 CLINICAL DATA:  Seizure post alcohol withdrawal EXAM: CT CERVICAL SPINE WITHOUT CONTRAST TECHNIQUE: Multidetector CT imaging of the cervical spine was performed without intravenous contrast. Multiplanar CT image reconstructions were also generated. COMPARISON:  None. FINDINGS: Brain: No evidence of acute territorial infarction, hemorrhage, hydrocephalus,extra-axial collection or mass lesion/mass effect. Normal gray-white differentiation. Ventricles are normal in size and contour. Vascular: No hyperdense vessel or unexpected calcification. Skull: The skull is intact. No fracture or focal lesion identified. Sinuses/Orbits: The visualized paranasal sinuses and mastoid air cells are clear. The orbits and globes intact. Other: None Cervical spine: Alignment: Physiologic Skull base and vertebrae: Visualized skull base is intact. No atlanto-occipital dissociation. The vertebral body heights are well maintained. No fracture or pathologic osseous lesion seen. Soft tissues and spinal canal: The visualized paraspinal soft tissues are unremarkable. No prevertebral soft tissue swelling is seen. The spinal canal is grossly unremarkable, no large epidural collection or significant canal narrowing. Disc levels:  No significant canal or neural foraminal narrowing. Upper chest: The lung apices are clear. Thoracic inlet is within normal limits. Other: None IMPRESSION: No acute intracranial abnormality. No acute fracture or malalignment of the spine. Electronically Signed   By: Prudencio Pair M.D.   On: 06/11/2020 23:41   CT ABDOMEN PELVIS W CONTRAST  Result Date: 06/15/2020 CLINICAL DATA:  Abdominal distension.  Follow-up  pancreatitis EXAM: CT ABDOMEN AND PELVIS WITH CONTRAST TECHNIQUE: Multidetector CT imaging of the abdomen and pelvis was performed using the standard protocol following bolus administration of intravenous contrast. CONTRAST:  146m OMNIPAQUE IOHEXOL 300 MG/ML  SOLN COMPARISON:  06/11/2020 FINDINGS: Lower chest: Small pleural effusions and bilateral lower lobe opacity with volume loss. The central line is seen into the upper right atrium. Hepatobiliary: No focal liver abnormality.No evidence of biliary obstruction or stone. Pancreas: Acute pancreatitis with diffuse parenchymal and adjacent edema. Patchy poor enhancement the uncinate process suggesting mild necrosis. There is progressive inflammation posterior to the pancreas where the portal vein confluence is severely attenuated but patent without visible luminal thrombus. No pseudoaneurysm is seen. Inferior to the gastric body is  a 9 cm low-density collection which is focal but does not show rim enhancement. Increased retroperitoneal edema. Small volume ascites. Spleen: Unremarkable. Adrenals/Urinary Tract: Negative adrenals. No hydronephrosis or stone. Unremarkable bladder. Stomach/Bowel:  No obstruction. No appendicitis. Vascular/Lymphatic: No acute vascular abnormality. No mass or adenopathy. Reproductive:No pathologic findings. Other: No pneumoperitoneum. Musculoskeletal: No acute abnormalities. IMPRESSION: 1. Acute edematous pancreatitis with progressive inflammation and possible mild necrosis at the uncinate. 2. 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 3. 9 cm non organized fluid collection at the gastrocolic ligament. 4. Lower lobe atelectasis and small pleural effusions, new. 5. Small volume ascites. Electronically Signed   By: Monte Fantasia M.D.   On: 06/15/2020 10:05   CT ABDOMEN PELVIS W CONTRAST  Result Date: 06/11/2020 CLINICAL DATA:  Abdominal distension. EXAM: CT ABDOMEN AND  PELVIS WITH CONTRAST TECHNIQUE: Multidetector CT imaging of the abdomen and pelvis was performed using the standard protocol following bolus administration of intravenous contrast. CONTRAST:  13m OMNIPAQUE IOHEXOL 300 MG/ML  SOLN COMPARISON:  None. FINDINGS: Lower chest: There are trace bilateral pleural effusions.The heart size is mildly enlarged. Hepatobiliary: The liver is normal. Normal gallbladder.There is no biliary ductal dilation. Pancreas: There is peripancreatic fat stranding. The pancreas appears to be uniformly enhancing. Spleen: Unremarkable. Adrenals/Urinary Tract: --Adrenal glands: Unremarkable. --Right kidney/ureter: No hydronephrosis or radiopaque kidney stones. --Left kidney/ureter: No hydronephrosis or radiopaque kidney stones. --Urinary bladder: The urinary bladder is distended. Stomach/Bowel: --Stomach/Duodenum: No hiatal hernia or other gastric abnormality. Normal duodenal course and caliber. --Small bowel: Unremarkable. --Colon: Unremarkable. --Appendix: Normal. Vascular/Lymphatic: Normal course and caliber of the major abdominal vessels. --No retroperitoneal lymphadenopathy. --No mesenteric lymphadenopathy. --No pelvic or inguinal lymphadenopathy. Reproductive: Unremarkable Other: There is retroperitoneal free fluid. There is a small volume intraperitoneal free fluid. Musculoskeletal. No acute displaced fractures. IMPRESSION: 1. Findings consistent with acute interstitial pancreatitis. Correlation with lipase is recommended. 2. Small volume intraperitoneal and retroperitoneal free fluid. 3. Trace bilateral pleural effusions. 4. Distended urinary bladder. Electronically Signed   By: CConstance HolsterM.D.   On: 06/11/2020 23:41   CT Shoulder Left Wo Contrast  Result Date: 06/12/2020 CLINICAL DATA:  Shoulder trauma EXAM: CT OF THE UPPER LEFT EXTREMITY WITHOUT CONTRAST TECHNIQUE: Multidetector CT imaging of the upper left extremity was performed according to the standard protocol.  COMPARISON:  Radiograph day FINDINGS: Bones/Joint/Cartilage There is been interval reduction of the anteroinferior dislocation. The humeral head now articulates with the glenoid. Again noted is comminuted impacted Hill-Sachs deformity of the posterolateral corner involving the anterior bicipital groove. The [facility] appears to be intact. Pain a small joint effusion is present. Ligaments Suboptimally assessed by CT. Muscles and Tendons Overlying deltoid musculature edema is seen. The remainder of the muscles appear to be intact. The rotator cuff appears to be grossly intact. Soft tissues Overlying subcutaneous edema seen. IMPRESSION: 1. Interval reduction of the anterior dislocation with the glenohumeral joint in near anatomic location. 2. Comminuted slightly impacted Hill-Sachs deformity which involve the anterior bicipital groove. Electronically Signed   By: BPrudencio PairM.D.   On: 06/12/2020 03:26   DG Chest Port 1 View  Result Date: 06/18/2020 CLINICAL DATA:  Respiratory failure EXAM: PORTABLE CHEST 1 VIEW COMPARISON:  06/17/2020 FINDINGS: Endotracheal tube has been advanced and its tip is now seen 2.6 cm above the carina. Nasogastric tube and nasoenteric feeding tube extend into the upper abdomen. Right upper extremity PICC line tip noted within the superior cavoatrial junction. Lung volumes remain  extremely small though pulmonary insufflation remain stable since prior examination. The lungs are clear. No pneumothorax or pleural effusion. Cardiac size is within normal limits when accounting for poor pulmonary insufflation. The pulmonary vascularity is normal. IMPRESSION: Support lines and tubes now in appropriate position. Stable pulmonary hypoinflation. Electronically Signed   By: Fidela Salisbury MD   On: 06/18/2020 02:39   DG Chest Port 1 View  Result Date: 06/17/2020 CLINICAL DATA:  Respiratory failure. EXAM: PORTABLE CHEST 1 VIEW COMPARISON:  June 14, 2020 FINDINGS: The ET tube terminates at the  thoracic inlet, 7.5 cm above the carina. It has been pulled back a couple cm in the interval. The NG and feeding tubes prior terminate below today's film. A right PICC line is stable. No pneumothorax. The left lung is clear. The cardiomediastinal silhouette is stable. Mild opacity in the right base is more pronounced in the interval. IMPRESSION: 1. The ETT terminates at the thoracic inlet. Recommend advancing 3 cm. 2. Other support apparatus as above. 3. Mild opacity in the right base is more pronounced in the interval and could represent atelectasis or developing infiltrate. Recommend attention on follow-up. Findings discussed with Myra Flowers Electronically Signed   By: Dorise Bullion III M.D   On: 06/17/2020 08:48   DG Chest Port 1 View  Result Date: 06/12/2020 CLINICAL DATA:  Seizure EXAM: PORTABLE CHEST 1 VIEW COMPARISON:  12/01/2018 FINDINGS: Lungs volumes are small, but are symmetric and are clear. No pneumothorax or pleural effusion. Cardiac size within normal limits. Pulmonary vascularity is normal. Osseous structures are age-appropriate. No acute bone abnormality. IMPRESSION: No active disease. Electronically Signed   By: Fidela Salisbury MD   On: 06/12/2020 00:22   DG Shoulder Left  Result Date: 06/12/2020 CLINICAL DATA:  Pain EXAM: LEFT SHOULDER - 2+ VIEW COMPARISON:  None. FINDINGS: There is an anterior inferior glenohumeral fracture dislocation of the left humerus. There is a displaced fracture fragment measuring approximately 3.6 cm. IMPRESSION: Anterior inferior glenohumeral fracture dislocation of the left humerus. Electronically Signed   By: Constance Holster M.D.   On: 06/12/2020 00:02   DG [employer] D[employer] Tube [employer] W/Fl W/Rad  Result Date: 06/15/2020 CLINICAL DATA:  Dobbhoff tube placement needed. EXAM: NASO G TUBE PLACEMENT WITH FL AND WITH RAD CONTRAST:  None. FLUOROSCOPY TIME:  Fluoroscopy Time:  3 minutes 36 seconds Radiation Exposure Index (if provided by the fluoroscopic device): 146.5  mGy Number of Acquired Spot Images: 3 COMPARISON:  CT 06/15/2020. FINDINGS: Dobbhoff tube was placed under fluoroscopic guidance to the level of the distal duodenum. As requested NG tube removed. Drainage catheter noted over the left upper quadrant. IMPRESSION: Dobbhoff tube was placed under fluoroscopic guidance to the level of the distal duodenum. Electronically Signed   By: Marcello Moores  Register   On: 06/15/2020 12:48   ECHOCARDIOGRAM COMPLETE  Result Date: 06/13/2020    ECHOCARDIOGRAM REPORT   Patient Name:   Marcus Small Date of Exam: 06/12/2020 Medical Rec #:  [id number]          Height:       65.0 in Accession #:    [id number]         Weight:       198.4 lb Date of Birth:  04/04/1991           BSA:          1.971 m Patient Age:    29 years           BP:  158/85 mmHg Patient Gender: M                  HR:           140 bpm. Exam Location:  [facility] Procedure: 2D Echo, Cardiac Doppler and Color Doppler Indications:     Chest Pain 786.50 / R07.9  History:         Patient has no prior history of Echocardiogram examinations.                  Risk Factors:Hypertension.  Sonographer:     Alyse Low Roar Referring Phys:  [place] Diagnosing Phys: Nelva Bush MD IMPRESSIONS  1. Left ventricular ejection fraction, by estimation, is 70 to 75%. The left ventricle has hyperdynamic function. Left ventricular endocardial border not optimally defined to evaluate regional wall motion. There is mild left ventricular hypertrophy. Indeterminate diastolic filling due to E-A fusion.  2. Right ventricular systolic function is normal. The right ventricular size is normal. Tricuspid regurgitation signal is inadequate for assessing PA pressure.  3. The mitral valve is grossly normal. No evidence of mitral valve regurgitation. No evidence of mitral stenosis.  4. The aortic valve has an indeterminant number of cusps. Aortic valve regurgitation is not visualized. No aortic stenosis is present. FINDINGS  Left Ventricle:  Left ventricular ejection fraction, by estimation, is 70 to 75%. The left ventricle has hyperdynamic function. Left ventricular endocardial border not optimally defined to evaluate regional wall motion. The left ventricular internal cavity size was normal in size. There is mild left ventricular hypertrophy. Indeterminate diastolic filling due to E-A fusion. Right Ventricle: The right ventricular size is normal. No increase in right ventricular wall thickness. Right ventricular systolic function is normal. Tricuspid regurgitation signal is inadequate for assessing PA pressure. Left Atrium: Left atrial size was normal in size. Right Atrium: Right atrial size was normal in size. Pericardium: There is no evidence of pericardial effusion. Mitral Valve: The mitral valve is grossly normal. No evidence of mitral valve regurgitation. No evidence of mitral valve stenosis. Tricuspid Valve: The tricuspid valve is not well visualized. Tricuspid valve regurgitation is trivial. Aortic Valve: The aortic valve has an indeterminant number of cusps. Aortic valve regurgitation is not visualized. No aortic stenosis is present. Aortic valve peak gradient measures 4.5 mmHg. Pulmonic Valve: The pulmonic valve was not well visualized. Pulmonic valve regurgitation is not visualized. No evidence of pulmonic stenosis. Aorta: The aortic root is normal in size and structure. Pulmonary Artery: The pulmonary artery is not well seen. Venous: The inferior vena cava was not well visualized. IAS/Shunts: The interatrial septum was not well visualized.  LEFT VENTRICLE PLAX 2D LVIDd:         4.40 cm  Diastology LVIDs:         3.40 cm  LV e' medial:    9.14 cm/s LV PW:         1.00 cm  LV E/e' medial:  5.6 LV IVS:        1.30 cm  LV e' lateral:   6.96 cm/s LVOT diam:     2.00 cm  LV E/e' lateral: 7.3 LVOT Area:     3.14 cm  RIGHT VENTRICLE RV Mid diam:    2.80 cm RV S prime:     19.60 cm/s TAPSE (M-mode): 2.4 cm LEFT ATRIUM             Index       RIGHT  ATRIUM  Index LA diam:        3.50 cm 1.78 cm/m  RA Area:     11.50 cm LA Vol (A2C):   28.7 ml 14.56 ml/m RA Volume:   23.40 ml  11.87 ml/m LA Vol (A4C):   39.2 ml 19.88 ml/m LA Biplane Vol: 35.7 ml 18.11 ml/m  AORTIC VALVE                PULMONIC VALVE AV Area (Vmax): 2.41 cm    PV Vmax:        1.10 m/s AV Vmax:        106.00 cm/s PV Peak grad:   4.8 mmHg AV Peak Grad:   4.5 mmHg    RVOT Peak grad: 4 mmHg LVOT Vmax:      81.40 cm/s  AORTA Ao Root diam: 2.60 cm MITRAL VALVE MV Area (PHT): 5.75 cm    SHUNTS MV Decel Time: 132 msec    Systemic Diam: 2.00 cm MV E velocity: 51.00 cm/s MV A velocity: 82.30 cm/s MV E/A ratio:  0.62 MV A Prime:    12.1 cm/s Nelva Bush MD Electronically signed by Nelva Bush MD Signature Date/Time: 06/13/2020/7:11:24 AM    Final    CT IMAGE GUIDED DRAINAGE BY PERCUTANEOUS CATHETER  Result Date: 06/15/2020 INDICATION: History of pancreatitis, now with indeterminate fluid collection anterior to the pancreatic bed. Please perform CT-guided drainage catheter placement for infection source control purposes. EXAM: CT IMAGE GUIDED DRAINAGE BY PERCUTANEOUS CATHETER COMPARISON:  CT abdomen pelvis-earlier same day; 06/12/2020 MEDICATIONS: The patient is currently admitted to the hospital and receiving intravenous antibiotics. The antibiotics were administered within an appropriate time frame prior to the initiation of the procedure. ANESTHESIA/SEDATION: Patient is currently intubated and sedated by ICU staff CONTRAST:  None COMPLICATIONS: None immediate. PROCEDURE: Informed written consent was obtained from the patient's mother after a discussion of the risks, benefits and alternatives to treatment. The patient was placed supine on the CT gantry and a pre procedural CT was performed re-demonstrating the known abscess/fluid collection within the ventral aspect of the abdomen with dominant component measuring approximately 7.8 x 5.9 cm (image 42, series 2). The procedure was  planned. A timeout was performed prior to the initiation of the procedure. The skin overlying the ventral aspect the left upper abdomen was prepped and draped in the usual sterile fashion. The overlying soft tissues were anesthetized with 1% lidocaine with epinephrine. Appropriate trajectory was planned with the use of a 22 gauge spinal needle. An 18 gauge trocar needle was advanced into the abscess/fluid collection and a short Amplatz super stiff wire was coiled within the collection. Appropriate positioning was confirmed with a limited CT scan. The tract was serially dilated allowing placement of a 12 [place] all-purpose drainage catheter. Appropriate positioning was confirmed with a limited postprocedural CT scan. Approximately 80 cc of bilious appearing fluid was aspirated. The tube was connected to a drainage bag and sutured in place. A dressing was placed. The patient tolerated the procedure well without immediate post procedural complication. IMPRESSION: Successful CT guided placement of a 32 French all purpose drain catheter into the indeterminate fluid collection/pseudocyst with the ventral aspect of the abdomen with aspiration of 80 mL of bilious appearing fluid. Samples were sent to the laboratory as requested by the ordering clinical team. Note, given concern for pseudocyst and bilious appearance of aspirated fluid, the sample was also sent for amylase and lipase levels. Electronically Signed   By: Eldridge Abrahams.D.  On: 06/15/2020 13:46   [place] EKG SITE RITE  Result Date: 06/14/2020 If Site Rite image not attached, placement could not be confirmed due to current cardiac rhythm.   Consults: Orthopedics PCCM Cardiology Psychiatry General surgery Nephrology  Subjective:    Overnight Issues: No overt events.  Remains sedated on the ventilator.  Objective:  Vital signs for last 24 hours: Temp:  [99.5 F (37.5 C)-102.1 F (38.9 C)] 100.76 F (38.2 C) (03/07 1100) Pulse Rate:  [91-104] 93  (03/07 1100) Resp:  [8-21] 9 (03/07 1100) BP: (110-146)/(65-78) 114/74 (03/07 1100) SpO2:  [93 %-100 %] 98 % (03/07 1107) FiO2 (%):  [21 %-30 %] 30 % (03/07 1107) Weight:  [103.1 kg] 103.1 kg (03/07 0449)  Hemodynamic parameters for last 24 hours:    Intake/Output from previous day: 03/06 0701 - 03/07 0700 In: 5616.8 [I.V.:4697.3; NG/GT:511.7; IV Piggyback:387.9] Out: 3465 [Urine:3150; Emesis/NG output:300; Drains:15]  Intake/Output this shift: Total I/O In: 942.9 [I.V.:778.3; IV Piggyback:164.5] Out: 400 [Urine:350; Emesis/NG output:50]  Vent settings for last 24 hours: Vent Mode: PRVC FiO2 (%):  [21 %-30 %] 30 % Set Rate:  [15 bmp] 15 bmp Vt Set:  [500 mL] 500 mL PEEP:  [5 cmH20] 5 cmH20 Plateau Pressure:  [32 cmH20] 32 cmH20  Physical Exam:  GENERAL: Obese male, sedated, RASS -3, intubated mechanically ventilated HEAD: Normocephalic, atraumatic.  EYES: Pupils equal, round, reactive to light.  No scleral icterus.  MOUTH: Orotracheally intubated, Dobbhoff via nare, OG present. NECK: Supple.  Thick neck.  No thyromegaly. Trachea midline.No adenopathy. PULMONARY: Good air entry bilaterally.  Rhonchi scattered throughout.  No wheezes. CARDIOVASCULAR: S1 and S2. Regular rate and rhythm.  No rubs, murmurs or gallops heard. ABDOMEN: Distended, anasarca present.  Diminished bowel sounds. MUSCULOSKELETAL: No joint deformity, no clubbing, anasarca 2-3+  NEUROLOGIC: Heavily sedated, cannot make further assessment. SKIN: Intact,warm,dry.    Assessment/Plan:  Acute respiratory failure with hypoxia D/T ETOH withdrawal & severe pancreatitis Aggravated by abdominal distention/decreased compliance Atelectasis due to the above Continue ventilator support Consider recruitment maneuvers Bronchodilators for mucociliary clearance SAT/SBT when meets criteria  Mildly elevated troponin's likely secondary to demand ischemia in the setting of acute pancreatitis: 93~214; Echo with  hyperdynamic LVEF Hx: HTN  Continuous telemetry monitoring  Cardiology consulted appreciate input-recommending no further workup at this time  deferred iv heparin in the setting of acute pancreatitis,  metoprolol to 7.5 mg iv q6hrs for heart rate and bp control  PRN labetalol - d/c'd  Acute pancreatitis secondary to ETOH abuse  Ileus OG to LIS Postpyloric Dobbhoff for feedings Add Reglan 5 mg every 8h Trend amylase and lipase  On dilaudid gtt Fluids to [facility] due to anasarca Albumin due to albuminemia/low oncotic pressure  Anemia without obvious acute blood loss Trend CBC  Monitor for s/sx of bleeding Transfuse for hgb <7 Has macrocytosis check B12/folate  Left glenohumeral joint dislocation with greater tuberosity fracture s/p left shoulder reduction in ER Orthopedic surgery consulted appreciate input Conservative nonsurgical management, arm sling LUE  Acute encephalopathy secondary to severe ETOH withdrawal  Seizure activity secondary to ETOH withdrwal  Hx: Anxiety, depression, and suicide ideation  Continue CIWA protocol, currently on infusions PRN ativan for seizure activity  Continue precedex gtt for now  On Versed infusion as well  Severe protein calorie malnutrition Initiate trickle feeds Aggravated by critical illness Albumin to supplement  Anasarca Due to acute critical illness Low oncotic pressure from hypoalbuminemia Malnutrition Supplemental albumin  Best practice Diet:  Trickle feeds via Dobbhoff  Pain/Anxiety/Delirium protocol (if indicated): prn dilaudid, ativan, Versed and precedex gtt  VAP protocol (if indicated):  Currently on protocol DVT prophylaxis: subq lovenox  GI prophylaxis: iv protonix  Glucose control: ICU hyperglycemia protocol Mobility:  Patient currently mechanically ventilated, PT on hold Disposition: ICU   Goals of Care Last date of multidisciplinary goals of care discussion: N/A Family and staff present: N/A Summary of  discussion: N/A Follow up goals of care discussion due: 06/20/2020 Code Status: Full code     LOS: 7 days   Additional comments: Multidisciplinary rounds were performed with the ICU team.  Care coordination was performed with bedside nurse  Critical Care Total Time*: 45 Minutes  Vernard Gambles 06/19/2020  *Care during the described time interval was provided by me and/or other providers on the critical care team.  I have reviewed this patient's available data, including medical history, events of note, physical examination and test results as part of my evaluation.

## 2020-06-19 NOTE — TOC Progression Note (Signed)
Transition of Care [facility]) - Progression Note    Patient Details  Name: Marcus Small MRN: [id number] Date of Birth: 1990/10/16  [facility]) CM/SW Contact  Marina Goodell Phone Number: [phone] 06/19/2020, 1:41 PM  Clinical Narrative:     Patient remains critically ill. Patient is still vent dependent, is sedated and intubated.  Patient is able to move extremities.  Possible trach and PEG once patient is medically stable. Rivka Safer (Mother) [phone] main contact. TOC will continue to follow.   Expected Discharge Plan: Home w Home Health Services Barriers to Discharge: Inadequate or no insurance,Financial Resources,Continued Medical Work up  Expected Discharge Plan and Services Expected Discharge Plan: Home w Home Health Services In-house Referral: Clinical Social Work   Post Acute Care Choice: Home Health Living arrangements for the past 2 months: Single Family Home                                       Social Determinants of Health (SDOH) Interventions    Readmission Risk Interventions No flowsheet data found.

## 2020-06-20 LAB — BASIC METABOLIC PANEL
Anion gap: 8 (ref 5–15)
BUN: 14 mg/dL (ref 6–20)
CO2: 26 mmol/L (ref 22–32)
Calcium: 8.1 mg/dL — ABNORMAL LOW (ref 8.9–10.3)
Chloride: 110 mmol/L (ref 98–111)
Creatinine, Ser: 1.6 mg/dL — ABNORMAL HIGH (ref 0.61–1.24)
GFR, Estimated: 60 mL/min — ABNORMAL LOW (ref >60)
Glucose, Bld: 146 mg/dL — ABNORMAL HIGH (ref 70–99)
Potassium: 3.8 mmol/L (ref 3.5–5.1)
Sodium: 144 mmol/L (ref 135–145)

## 2020-06-20 LAB — PHOSPHORUS: Phosphorus: 5.5 mg/dL — ABNORMAL HIGH (ref 2.5–4.6)

## 2020-06-20 LAB — MAGNESIUM: Magnesium: 1.8 mg/dL (ref 1.7–2.4)

## 2020-06-20 LAB — AEROBIC/ANAEROBIC CULTURE W GRAM STAIN (SURGICAL/DEEP WOUND)
Culture: NO GROWTH
Gram Stain: NONE SEEN

## 2020-06-20 LAB — GLUCOSE, CAPILLARY
Glucose-Capillary: 119 mg/dL — ABNORMAL HIGH (ref 70–99)
Glucose-Capillary: 123 mg/dL — ABNORMAL HIGH (ref 70–99)
Glucose-Capillary: 130 mg/dL — ABNORMAL HIGH (ref 70–99)
Glucose-Capillary: 134 mg/dL — ABNORMAL HIGH (ref 70–99)
Glucose-Capillary: 133 mg/dL — ABNORMAL HIGH (ref 70–99)
Glucose-Capillary: 127 mg/dL — ABNORMAL HIGH (ref 70–99)

## 2020-06-20 LAB — CBC
HCT: 27.9 % — ABNORMAL LOW (ref 39.0–52.0)
Hemoglobin: 8.2 g/dL — ABNORMAL LOW (ref 13.0–17.0)
MCH: 30.1 pg (ref 26.0–34.0)
MCHC: 29.4 g/dL — ABNORMAL LOW (ref 30.0–36.0)
MCV: 102.6 fL — ABNORMAL HIGH (ref 80.0–100.0)
Platelets: 499 10*3/uL — ABNORMAL HIGH (ref 150–400)
RBC: 2.72 MIL/uL — ABNORMAL LOW (ref 4.22–5.81)
RDW: 13.6 % (ref 11.5–15.5)
WBC: 18.1 10*3/uL — ABNORMAL HIGH (ref 4.0–10.5)
nRBC: 0.3 % — ABNORMAL HIGH (ref 0.0–0.2)

## 2020-06-20 LAB — LIPASE, BLOOD: Lipase: 53 U/L — ABNORMAL HIGH (ref 11–51)

## 2020-06-20 LAB — MISC LABCORP TEST (SEND OUT): Labcorp test code: 829034

## 2020-06-20 LAB — AMYLASE: Amylase: 59 U/L (ref 28–100)

## 2020-06-20 MED ORDER — METHYLNALTREXONE BROMIDE 12 MG/0.6ML ~~LOC~~ SOLN
12.0000 mg | Freq: Once | SUBCUTANEOUS | Status: AC
  Administered 2020-06-20: 12 mg via SUBCUTANEOUS
  Filled 2020-06-20: qty 0.6

## 2020-06-20 MED ORDER — PROSOURCE TF PO LIQD
90.0000 mL | Freq: Four times a day (QID) | ORAL | Status: DC
  Administered 2020-06-20 – 2020-06-22 (×7): 90 mL
  Filled 2020-06-20 (×11): qty 90

## 2020-06-20 MED ORDER — OXYCODONE HCL 5 MG PO TABS
5.0000 mg | ORAL_TABLET | Freq: Four times a day (QID) | ORAL | Status: DC
  Administered 2020-06-20 – 2020-06-22 (×8): 5 mg
  Filled 2020-06-20 (×8): qty 1

## 2020-06-20 MED ORDER — MAGNESIUM SULFATE 2 GM/50ML IV SOLN
2.0000 g | Freq: Once | INTRAVENOUS | Status: AC
  Administered 2020-06-20: 2 g via INTRAVENOUS
  Filled 2020-06-20: qty 50

## 2020-06-20 MED ORDER — VITAL 1.5 CAL PO LIQD: 1000.0000 mL | ORAL | Status: DC

## 2020-06-20 MED ORDER — FREE WATER
200.0000 mL | Status: DC
  Administered 2020-06-20 – 2020-06-21 (×5): 200 mL

## 2020-06-20 MED ORDER — VITAL 1.5 CAL PO LIQD
1000.0000 mL | ORAL | Status: DC
  Administered 2020-06-20 (×2): 1000 mL

## 2020-06-20 MED ORDER — METOPROLOL TARTRATE 25 MG PO TABS
12.5000 mg | ORAL_TABLET | Freq: Two times a day (BID) | ORAL | Status: DC
  Administered 2020-06-20 – 2020-06-21 (×3): 12.5 mg
  Filled 2020-06-20 (×3): qty 1

## 2020-06-20 NOTE — Progress Notes (Signed)
[facility]  ROUNDING NOTE   Subjective:   Tmax 103 Wbc 18.1 (12.2)  UOP 3310m  Na 144 Off dextrose infusion  Objective:  Vital signs in last 24 hours:  Temp:  [98.24 F (36.8 C)-101.3 F (38.5 C)] 100.4 F (38 C) (03/08 0800) Pulse Rate:  [90-108] 108 (03/08 0800) Resp:  [8-16] 14 (03/08 0800) BP: (101-151)/(55-81) 109/63 (03/08 0800) SpO2:  [96 %-100 %] 100 % (03/08 0800) FiO2 (%):  [30 %] 30 % (03/08 0745) Weight:  [103.1 kg] 103.1 kg (03/08 0444)  Weight change: 0 kg Filed Weights   06/18/20 0432 06/19/20 0449 06/20/20 0444  Weight: 103.1 kg 103.1 kg 103.1 kg    Intake/Output: I/O last 3 completed shifts: In: 4852.5 [I.V.:4102.7; Other:15; NG/GT:226.3; IV Piggyback:508.6] Out: 6090 [Urine:4725; Emesis/NG output:1350; Drains:15]   Intake/Output this shift:  Total I/O In: 343.4 [I.V.:53.5; NG/GT:270; IV Piggyback:19.9] Out: 400 [Urine:300; Emesis/NG output:100]  Physical Exam: General: Critically ill  Head: ETT   Eyes: PERRL  Neck: trachea midline  Lungs:  PRVC 30%  Heart: Regular rate and rhythm  Abdomen:  Soft  Extremities:  no peripheral edema.  Neurologic: Intubated and sedated  Skin: No lesions  GU: Foley with urine    Basic Metabolic Panel: Recent Labs  Lab 06/17/20 0450 06/18/20 0400 06/19/20 0359 06/19/20 1233 06/20/20 0358  NA 148* 144 142 140 144  K 3.4* 3.2* 3.5 4.1 3.8  CL 117* 111 108 107 110  CO2 _0 GLUCOSE 147* 166* 166* 115* 146*  BUN _1 CREATININE 1.62* 1.73* 1.49* 1.72* 1.60*  CALCIUM 8.4* 8.3* 8.7* 8.8* 8.1*  MG 1.8 1.8 1.9 2.0 1.8  PHOS 4.9* 4.6 4.4 5.2* 5.5*    Liver Function Tests: Recent Labs  Lab 06/14/20 0719 06/15/20 0523 06/16/20 0503 06/17/20 0450 06/18/20 0400 06/19/20 1233  AST 76* 73* 40 28 36  --   ALT _2 --   ALKPHOS 75 73 91 91 110  --   BILITOT 2.2* 2.1* 1.6* 1.4* 1.4*  --   PROT 6.0* 6.3* 6.0* 6.0* 5.9*  --   ALBUMIN 2.5* 2.3* 2.0* 2.0*  1.9* 2.2*   Recent Labs  Lab 06/16/20 0503 06/17/20 0450 06/18/20 0400 06/19/20 0359 06/20/20 0358  LIPASE 45 39 38 44 53*  AMYLASE 91 71 64 67 59   No results for input(s): AMMONIA in the last 168 hours.  CBC: Recent Labs  Lab 06/16/20 0503 06/17/20 0450 06/18/20 0400 06/19/20 0315 06/19/20 1233 06/20/20 0358  WBC 9.8 11.0* 13.2* 12.2*  --  18.1*  HGB 10.5* 10.4* 9.5* 7.7* 9.8* 8.2*  HCT 35.6* 34.0* 31.2* 26.1* 32.4* 27.9*  MCV 102.6* 100.6* 98.4 102.0*  --  102.6*  PLT 298 382 448* 368  --  499*    Cardiac Enzymes: No results for input(s): CKTOTAL, CKMB, CKMBINDEX, TROPONINI in the last 168 hours.  BNP: Invalid input(s): POCBNP  CBG: Recent Labs  Lab 06/19/20 1607 06/19/20 1917 06/19/20 2321 06/20/20 0318 06/20/20 0722  GLUCAP 126* 114* 109* 119* 123*    Microbiology: Results for orders placed or performed during the hospital encounter of 06/11/20  Resp Panel by RT-PCR (Flu A&B, Covid) Nasopharyngeal Swab     Status: None   Collection Time: 06/11/20  9:32 PM   Specimen: Nasopharyngeal Swab; Nasopharyngeal(NP) swabs in vial transport medium  Result Value Ref Range Status   SARS Coronavirus 2 by RT PCR NEGATIVE NEGATIVE  Final    Comment: (NOTE) SARS-CoV-2 target nucleic acids are NOT DETECTED.  The SARS-CoV-2 RNA is generally detectable in upper respiratory specimens during the acute phase of infection. The lowest concentration of SARS-CoV-2 viral copies this assay can detect is 138 copies/mL. A negative result does not preclude SARS-Cov-2 infection and should not be used as the sole basis for treatment or other patient management decisions. A negative result may occur with  improper specimen collection/handling, submission of specimen other than nasopharyngeal swab, presence of viral mutation(s) within the areas targeted by this assay, and inadequate number of viral copies(<138 copies/mL). A negative result must be combined with clinical  observations, patient history, and epidemiological information. The expected result is Negative.  Fact Sheet for Patients:  EntrepreneurPulse.com.au  Fact Sheet for Healthcare Providers:  IncredibleEmployment.be  This test is no t yet approved or cleared by the [place] FDA and  has been authorized for detection and/or diagnosis of SARS-CoV-2 by FDA under an Emergency Use Authorization (EUA). This EUA will remain  in effect (meaning this test can be used) for the duration of the COVID-19 declaration under Section 564(b)(1) of the Act, 21 U.S.C.section 360bbb-3(b)(1), unless the authorization is terminated  or revoked sooner.       Influenza A by PCR NEGATIVE NEGATIVE Final   Influenza B by PCR NEGATIVE NEGATIVE Final    Comment: (NOTE) The Xpert Xpress SARS-CoV-2/FLU/RSV plus assay is intended as an aid in the diagnosis of influenza from Nasopharyngeal swab specimens and should not be used as a sole basis for treatment. Nasal washings and aspirates are unacceptable for Xpert Xpress SARS-CoV-2/FLU/RSV testing.  Fact Sheet for Patients: EntrepreneurPulse.com.au  Fact Sheet for Healthcare Providers: IncredibleEmployment.be  This test is not yet approved or cleared by the [place] FDA and has been authorized for detection and/or diagnosis of SARS-CoV-2 by FDA under an Emergency Use Authorization (EUA). This EUA will remain in effect (meaning this test can be used) for the duration of the COVID-19 declaration under Section 564(b)(1) of the Act, 21 U.S.C. section 360bbb-3(b)(1), unless the authorization is terminated or revoked.  Performed at [facility], [address] [place], [place] [address]   Blood culture (routine x 2)     Status: None   Collection Time: 06/12/20 12:17 AM   Specimen: BLOOD  Result Value Ref Range Status   Specimen Description BLOOD RIGHT FA  Final   Special  Requests   Final    BOTTLES DRAWN AEROBIC AND ANAEROBIC Blood Culture results may not be optimal due to an inadequate volume of blood received in culture bottles   Culture   Final    NO GROWTH 5 DAYS Performed at [facility], [address] [place], [place] [address]    Report Status 06/17/2020 FINAL  Final  Blood culture (routine x 2)     Status: None   Collection Time: 06/12/20 12:45 AM   Specimen: BLOOD  Result Value Ref Range Status   Specimen Description BLOOD RIGHT HAND  Final   Special Requests   Final    BOTTLES DRAWN AEROBIC AND ANAEROBIC Blood Culture results may not be optimal due to an inadequate volume of blood received in culture bottles   Culture   Final    NO GROWTH 5 DAYS Performed at [facility], [address], [place], [place] [address]    Report Status 06/17/2020 FINAL  Final  MRSA PCR Screening     Status: None   Collection Time: 06/12/20  3:37 AM   Specimen: Nasopharyngeal  Result Value Ref Range Status   MRSA by PCR NEGATIVE NEGATIVE Final    Comment:        The GeneXpert MRSA Assay (FDA approved for NASAL specimens only), is one component of a comprehensive MRSA colonization surveillance program. It is not intended to diagnose MRSA infection nor to guide or monitor treatment for MRSA infections. Performed at [facility], [place], [place] [address]   Respiratory (~20 pathogens) panel by PCR     Status: None   Collection Time: 06/14/20  5:30 PM   Specimen: Nasopharyngeal Swab; Respiratory  Result Value Ref Range Status   Adenovirus NOT DETECTED NOT DETECTED Final   Coronavirus 229E NOT DETECTED NOT DETECTED Final    Comment: (NOTE) The Coronavirus on the Respiratory Panel, DOES NOT test for the novel  Coronavirus (2019 nCoV)    Coronavirus HKU1 NOT DETECTED NOT DETECTED Final   Coronavirus NL63 NOT DETECTED NOT DETECTED Final   Coronavirus OC43 NOT DETECTED NOT DETECTED Final   Metapneumovirus  NOT DETECTED NOT DETECTED Final   Rhinovirus / Enterovirus NOT DETECTED NOT DETECTED Final   Influenza A NOT DETECTED NOT DETECTED Final   Influenza B NOT DETECTED NOT DETECTED Final   Parainfluenza Virus 1 NOT DETECTED NOT DETECTED Final   Parainfluenza Virus 2 NOT DETECTED NOT DETECTED Final   Parainfluenza Virus 3 NOT DETECTED NOT DETECTED Final   Parainfluenza Virus 4 NOT DETECTED NOT DETECTED Final   Respiratory Syncytial Virus NOT DETECTED NOT DETECTED Final   Bordetella pertussis NOT DETECTED NOT DETECTED Final   Bordetella Parapertussis NOT DETECTED NOT DETECTED Final   Chlamydophila pneumoniae NOT DETECTED NOT DETECTED Final   Mycoplasma pneumoniae NOT DETECTED NOT DETECTED Final    Comment: Performed at [facility], [place]. [address] [place], [place] [address]  Culture, Respiratory w Gram Stain     Status: None   Collection Time: 06/14/20  5:46 PM   Specimen: Tracheal Aspirate; Respiratory  Result Value Ref Range Status   Specimen Description   Final    TRACHEAL ASPIRATE Performed at [facility], [address] [place], [place] [address]    Special Requests   Final    NONE Performed at [facility], [address] [place], [place] [address]    Gram Stain   Final    MODERATE WBC PRESENT,BOTH PMN AND MONONUCLEAR MODERATE GRAM NEGATIVE RODS FEW GRAM POSITIVE COCCI IN PAIRS IN CLUSTERS    Culture   Final    FEW Normal respiratory flora-no Staph aureus or Pseudomonas seen Performed at [facility], [address] [place], [place] [address]    Report Status 06/17/2020 FINAL  Final  Aerobic/Anaerobic Culture (surgical/deep wound)     Status: None (Preliminary result)   Collection Time: 06/15/20 12:29 PM   Specimen: Abscess  Result Value Ref Range Status   Specimen Description   Final    ABSCESS Performed at [facility], [address] [place], [place] [address]    Special Requests   Final    NONE Performed at [facility], [place], [place] [address]    Gram Stain NO ORGANISMS SEEN  Final   Culture   Final    NO GROWTH 4 DAYS NO ANAEROBES ISOLATED; CULTURE IN PROGRESS FOR 5 DAYS Performed at [facility], [place] [address], [place], [place] [address]    Report Status PENDING  Incomplete  CULTURE, BLOOD (ROUTINE X 2) w Reflex to ID Panel     Status: None (Preliminary result)   Collection Time: 06/18/20  7:47 PM   Specimen: BLOOD  Result Value Ref Range Status   Specimen Description BLOOD BLOOD LEFT HAND  Final   Special Requests   Final    BOTTLES DRAWN AEROBIC AND ANAEROBIC Blood Culture results may not be optimal due to an excessive volume of blood received in culture bottles   Culture   Final    NO GROWTH 2 DAYS Performed at [facility], [address], [place], [place] [address]    Report Status PENDING  Incomplete  CULTURE, BLOOD (ROUTINE X 2) w Reflex to ID Panel     Status: None (Preliminary result)   Collection Time: 06/18/20  7:56 PM   Specimen: BLOOD  Result Value Ref Range Status   Specimen Description BLOOD BLOOD RIGHT HAND  Final   Special Requests   Final    BOTTLES DRAWN AEROBIC AND ANAEROBIC Blood Culture results may not be optimal due to an excessive volume of blood received in culture bottles   Culture   Final    NO GROWTH 2 DAYS Performed at [facility], [address] [place], [place] [address]    Report Status PENDING  Incomplete    Coagulation Studies: No results for input(s): LABPROT, INR in the last 72 hours.  Urinalysis: No results for input(s): COLORURINE, LABSPEC, PHURINE, GLUCOSEU, HGBUR, BILIRUBINUR, KETONESUR, PROTEINUR, UROBILINOGEN, NITRITE, LEUKOCYTESUR in the last 72 hours.  Invalid input(s): APPERANCEUR    Imaging: CT ABDOMEN PELVIS WO CONTRAST  Result Date: 06/19/2020 CLINICAL DATA:  Peritonitis or perforations suspected. Follow-up of pancreatitis. Status post drainage of peripancreatic fluid  collection 06/15/2020. EXAM: CT ABDOMEN AND PELVIS WITHOUT CONTRAST TECHNIQUE: Multidetector CT imaging of the abdomen and pelvis was performed following the standard protocol without IV contrast. COMPARISON:  06/15/2020 abdominopelvic CT. FINDINGS: Lower chest: Left greater than right base atelectasis. Normal heart size with similar small left pleural effusion. Hepatobiliary: Multifactorial degradation, including arm position, lack of IV contrast. Grossly normal noncontrast appearance of the liver, gallbladder, biliary tract. Pancreas: Moderate pancreatitis is similar, as evidenced by enlargement of and edema surrounding the pancreas diffusely. Percutaneous drain within the previously described collection in the gastrocolic ligament, which has resolved. No new well-defined fluid collection, given above limitations. Spleen: Grossly normal spleen. Adrenals/Urinary Tract: Normal adrenal glands. No renal calculi or hydronephrosis. No hydroureter or ureteric calculi. Foley catheter within the urinary bladder. Stomach/Bowel: Feeding tube terminates in the transverse duodenum. Normal colon, appendix, and terminal ileum. Otherwise normal small bowel. Vascular/Lymphatic: Normal caliber of the aorta and branch vessels. Mildly prominent abdominal retroperitoneal nodes are likely reactive. No pelvic adenopathy. Reproductive: Normal prostate. Other: Small volume abdominopelvic fluid, minimally decreased. No free intraperitoneal air. Anasarca. Presumably iatrogenic gas within the anterior abdominal wall subcutaneous tissues. Musculoskeletal: No acute osseous abnormality. IMPRESSION: 1. Multifactorial degradation, arm position, and lack of IV contrast. 2. Similar moderate pancreatitis. Resolution of previously described gastrocolic ligament fluid collection. 3. Decreased small volume abdominopelvic fluid. 4. Similar small left pleural effusion with bibasilar atelectasis. Electronically Signed   By: Abigail Miyamoto M.D.   On:  06/19/2020 10:17     Medications:   . albumin human Stopped (06/19/20 1751)  . dexmedetomidine (PRECEDEX) IV infusion 1.2 mcg/kg/hr (06/20/20 0900)  . feeding supplement (VITAL 1.5 CAL) 25 mL/hr at 06/20/20 0200  . HYDROmorphone 4 mg/hr (06/20/20 0800)  . midazolam 6 mg/hr (  06/20/20 0800)  . piperacillin-tazobactam (ZOSYN)  IV 12.5 mL/hr at 06/20/20 0800   . albuterol  2.5 mg Nebulization Q6H  . chlorhexidine gluconate (MEDLINE KIT)  15 mL Mouth Rinse BID  . Chlorhexidine Gluconate Cloth  6 each Topical Daily  . docusate  100 mg Per Tube BID  . enoxaparin (LOVENOX) injection  0.5 mg/kg Subcutaneous Q24H  . feeding supplement (PROSource TF)  45 mL Per Tube TID  . fentaNYL (SUBLIMAZE) injection  50 mcg Intravenous Once  . folic acid  1 mg Intravenous Daily  . free water  100 mL Per Tube Q4H  . mouth rinse  15 mL Mouth Rinse 10 times per day  . metoCLOPramide (REGLAN) injection  5 mg Intravenous Q8H  . metoprolol tartrate  7.5 mg Intravenous Q6H  . multivitamin with minerals  1 tablet Oral Daily  . pantoprazole (PROTONIX) IV  40 mg Intravenous Q24H  . polyethylene glycol  17 g Per Tube Daily  . sodium chloride flush  10-40 mL Intracatheter Q12H  . sodium chloride flush  3 mL Intravenous Q12H  . sodium chloride flush  5 mL Intracatheter Q8H  . thiamine  100 mg Oral Daily   Or  . thiamine  100 mg Intravenous Daily   acetaminophen (TYLENOL) oral liquid 160 mg/5 mL, HYDROmorphone, LORazepam, midazolam, ondansetron (ZOFRAN) IV, sodium chloride flush, vecuronium  Assessment/ Plan:  Mr. Marcus Small is a 30 y.o. black male with history of left glenohumeral joint dislocation with greater tuberosity fracture status post left shoulder reduction, hypertension, anxiety, alcohol abuse, depression, who was admitted to [facility] on 06/11/2020 for evaluation of seizure and found to have severe pancreatitis.  1.  Acute kidney injury secondary to acute tubular necrosis. Baseline creatinine of  1.04 with normal GFR on 12/01/2018.  Acute kidney injury secondary to ATN due to severe underlying pancreatitis and IV contrast exposure on 06/15/20.   No indication for dialysis.  Creatinine stable. Nonoliguric urine output.   2.  Hypernatremia. Free water deficit of 1.8 liters.  -  Increase free water flushes - Consider restarting dextrose infusion.   3.  Acute respiratory failure: requiring intubation and mechanical ventilation  4. Anemia with renal failure: hemoglobin 8.2, no indication for ESA   LOS: 8 Sarath Kolluru 3/8/20229:14 AM

## 2020-06-20 NOTE — Progress Notes (Signed)
Patient opening eyes spontaneously, wiggled toes on left foot on command.

## 2020-06-20 NOTE — Progress Notes (Signed)
NAME:  Marcus Small, MRN:  [id number], DOB:  June 28, 1990, LOS: 8 ADMISSION DATE:  06/11/2020, CONSULTATION DATE:  06/20/20 REFERRING MD:  Mikki Harbor, CHIEF COMPLAINT:  Etoh abuse  Brief History:  This is a 30 yo male who presents with etoh withdrawal with glenohumeral joint dislocation. Also complicated by pancreatitis  History of Present Illness:  Marcus Small is an 30 y.o. male admitted withleft glenohumeral joint fracture/dislocation with greater pt s/p left shoulder reduction in ER; pancreatitis; and seizure activity secondary to severe ETOH withdrawal requiring precedex gtt.  Required intubation due to progressive encephalopathy due to alcohol withdrawal.  Past Medical History:  Anxiety HTN  Significant Hospital Events:  02/28severe ETOH withdrawal, severe pancreatitis 3/1 severe pancreatitis 3/2 increased WOB and SOB, severe DT's, patient emergently intubated 3/2 PICC line placed 3/3 remains intubated, JP drain placed into 9cm fluid collection by IR 3/3 IR placed dubhoff tube, started trickle feeds 3/4 remains on vent, severe pancreatitis 3/5 OG suction c/w TF's, severe abd distention 3/6 continue vent dependence, anasarca, abdominal distention.  Start albumin, Reglan, restart feeds.   Consults:  Orthopedics PCCM Cardiology Psychiatry General surgery Nephrology   Procedures:  IR drain of abdominal fluid collection   Significant Diagnostic Tests:  CT abdomen on 06/19/20 as below   IMPRESSION: 1. Multifactorial degradation, arm position, and lack of IV contrast. 2. Similar moderate pancreatitis. Resolution of previously described gastrocolic ligament fluid collection. 3. Decreased small volume abdominopelvic fluid. 4. Similar small left pleural effusion with bibasilar atelectasis.   Electronically Signed   By: Jeronimo Greaves M.D.   On: 06/19/2020 10:17  Micro Data:  Blood cultures thus far negative.  Antimicrobials:  Day 7 zosyn  Interim  History / Subjective:  Patient agitated and asynchronous overnight  Objective   Blood pressure 101/68, pulse (!) 101, temperature 100.04 F (37.8 C), resp. rate 10, height 5\' 5"  (1.651 m), weight 103.1 kg, SpO2 100 %.    Vent Mode: PRVC FiO2 (%):  [30 %] 30 % Set Rate:  [15 bmp] 15 bmp Vt Set:  [500 mL] 500 mL PEEP:  [5 cmH20] 5 cmH20   Intake/Output Summary (Last 24 hours) at 06/20/2020 0810 Last data filed at 06/20/2020 0753 Gross per 24 hour  Intake 2291.18 ml  Output 4385 ml  Net -2093.82 ml   Filed Weights   06/18/20 0432 06/19/20 0449 06/20/20 0444  Weight: 103.1 kg 103.1 kg 103.1 kg    Examination: General: Patient intermittently will respond to pain. Sedated on versed fentanyl and precedex HENT: ET tube in place Lungs: Mechanical breath sounds  Cardiovascular: RRR. There is swelling in lower extremities Abdomen: Distended but soft. Not rigid Extremities: Swelling  Neuro: moves all extremities spontaneously  GU: Foley place  Resolved Hospital Problem list     Assessment & Plan:  This is a 29 yo who presents with DT's, pancreatitis, seizures and  Dislocated shoulder    Acute pancreatitis secondary to ETOH abuse  Ileus OG to LIS Postpyloric Dobbhoff for feedings Continue Reglan 5 mg every 8h Wean dilaudid gtt and versed with PO alternatives   Acute respiratory failure with hypoxia-resolving. On minimal vent settings. Needs D/T ETOH withdrawal & severe pancreatitis - recruitment maneuvers initiated for compliance issues with abdominal distension- -Bronchodilators for mucociliary clearance -SAT/SBT when meets criteria  Mildly elevated troponin's likely secondary to demand ischemia in the setting of acute pancreatitis: 93~214; Echo with hyperdynamic LVEF Hx: HTN  Continuous telemetry monitoring  Cardiology consulted appreciate input-recommending no further workup at  this time  deferred iv heparin in the setting of acute pancreatitis,  metoprolol to 7.5 mg  iv q6hrs for heart rate and bp control  PRN labetalol - d/c'd     Leftglenohumeral joint dislocation with greater tuberosity fracture s/p left shoulder reduction in ER Orthopedic surgery consulted appreciate input Conservative nonsurgical management, arm sling LUE  Acute encephalopathy secondary to severe ETOH withdrawal  Seizure activity secondary to ETOH withdrwal  Hx: Anxiety, depression, and suicide ideation . Out of window for DT's Continue CIWA protocol, currently on infusions PRN ativan for seizure activity  Wean versed with PO alternative   Best practice (evaluated daily)  Diet: Feeds at 25/hr Pain/Anxiety/Delirium protocol (if indicated): Weaning to PO. But on fentanyl, Versed and precedex. VAP protocol (if indicated): HOB 30 degrees DVT prophylaxis: lovenox GI prophylaxis: protonix and being fed Glucose control: Well controlled FSBS Q4 Mobility: bed rest Disposition:ICU  Goals of Care:  Last date of multidisciplinary goals of care discussion:06/19/20 Family and staff present: Bedside nurse Code Status: Full  Labs   CBC: Recent Labs  Lab 06/16/20 0503 06/17/20 0450 06/18/20 0400 06/19/20 0315 06/19/20 1233 06/20/20 0358  WBC 9.8 11.0* 13.2* 12.2*  --  18.1*  HGB 10.5* 10.4* 9.5* 7.7* 9.8* 8.2*  HCT 35.6* 34.0* 31.2* 26.1* 32.4* 27.9*  MCV 102.6* 100.6* 98.4 102.0*  --  102.6*  PLT 298 382 448* 368  --  499*    Basic Metabolic Panel: Recent Labs  Lab 06/17/20 0450 06/18/20 0400 06/19/20 0359 06/19/20 1233 06/20/20 0358  NA 148* 144 142 140 144  K 3.4* 3.2* 3.5 4.1 3.8  CL 117* 111 108 107 110  CO2 25 25 26 26 26   GLUCOSE 147* 166* 166* 115* 146*  BUN 9 8 9 11 14   CREATININE 1.62* 1.73* 1.49* 1.72* 1.60*  CALCIUM 8.4* 8.3* 8.7* 8.8* 8.1*  MG 1.8 1.8 1.9 2.0 1.8  PHOS 4.9* 4.6 4.4 5.2* 5.5*   GFR: Estimated Creatinine Clearance: 75.9 mL/min (A) (by C-G formula based on SCr of 1.6 mg/dL (H)). Recent Labs  Lab 06/14/20 1441  06/15/20 0523 06/16/20 0503 06/17/20 0450 06/18/20 0400 06/19/20 0315 06/20/20 0358  PROCALCITON 0.63 0.89 0.45  --   --   --   --   WBC  --  9.5 9.8 11.0* 13.2* 12.2* 18.1*    Liver Function Tests: Recent Labs  Lab 06/14/20 0719 06/15/20 0523 06/16/20 0503 06/17/20 0450 06/18/20 0400 06/19/20 1233  AST 76* 73* 40 28 36  --   ALT 27 28 27 22 22   --   ALKPHOS 75 73 91 91 110  --   BILITOT 2.2* 2.1* 1.6* 1.4* 1.4*  --   PROT 6.0* 6.3* 6.0* 6.0* 5.9*  --   ALBUMIN 2.5* 2.3* 2.0* 2.0* 1.9* 2.2*   Recent Labs  Lab 06/16/20 0503 06/17/20 0450 06/18/20 0400 06/19/20 0359 06/20/20 0358  LIPASE 45 39 38 44 53*  AMYLASE 91 71 64 67 59   No results for input(s): AMMONIA in the last 168 hours.  ABG    Component Value Date/Time   PHART 7.35 06/14/2020 1415   PCO2ART 43 06/14/2020 1415   PO2ART 98 06/14/2020 1415   HCO3 23.7 06/14/2020 1415   ACIDBASEDEF 2.0 06/14/2020 1415   O2SAT 97.3 06/14/2020 1415     Coagulation Profile: No results for input(s): INR, PROTIME in the last 168 hours.  Cardiac Enzymes: No results for input(s): CKTOTAL, CKMB, CKMBINDEX, TROPONINI in the last 168 hours.  HbA1C:  No results found for: HGBA1C  CBG: Recent Labs  Lab 06/19/20 1607 06/19/20 1917 06/19/20 2321 06/20/20 0318 06/20/20 0722  GLUCAP 126* 114* 109* 119* 123*    Review of Systems:   Unable to attain as patient sedated and intubated  Past Medical History:  He,  has a past medical history of Anxiety and Hypertension.   Surgical History:  History reviewed. No pertinent surgical history.   Social History:   reports that he has been smoking cigarettes. He has been smoking about 0.50 packs per day. He has never used smokeless tobacco. He reports current alcohol use of about 18.0 standard drinks of alcohol per week.   Family History:  His family history includes Hypertension in his mother.   Allergies Allergies  Allergen Reactions  . Peanut-Containing Drug  Products Itching  . Shellfish Allergy      Home Medications  Prior to Admission medications   Medication Sig Start Date End Date Taking? Authorizing Provider  Aspirin-Acetaminophen-Caffeine (GOODY HEADACHE PO) Take 1 packet by mouth as needed.   Yes [provider]  ibuprofen (ADVIL) 200 MG tablet Take 200 mg by mouth every 6 (six) hours as needed for headache.   Yes [provider]  albuterol (VENTOLIN HFA) 108 (90 Base) MCG/ACT inhaler Inhale 2 puffs into the lungs every 6 (six) hours as needed for wheezing or shortness of breath. Patient not taking: No sig reported 12/02/18   Don Perking, [place], MD  ketorolac (TORADOL) 10 MG tablet Take 1 tablet (10 mg total) by mouth every 8 (eight) hours as needed. Patient not taking: No sig reported 08/10/15   Darci Current, MD  meloxicam (MOBIC) 15 MG tablet Take 1 tablet (15 mg total) by mouth daily. Patient not taking: No sig reported 09/05/19   Cuthriell, Christiane Ha D, PA-C  ondansetron (ZOFRAN ODT) 4 MG disintegrating tablet Take 1 tablet (4 mg total) by mouth every 8 (eight) hours as needed. Patient not taking: No sig reported 12/02/18   Nita Sickle, MD     Critical care time: 35 minutes

## 2020-06-20 NOTE — Progress Notes (Signed)
Spoke with patient's mother via phone, password provided. Mother updated on patient condition, all questions answered.

## 2020-06-20 NOTE — Progress Notes (Signed)
   06/20/20 1030  Vitals  Temp (!) 101.66 F (38.7 C)   PRN acetaminophen given for fever

## 2020-06-20 NOTE — Progress Notes (Signed)
Recruitment maneuver performed. 

## 2020-06-21 ENCOUNTER — Inpatient Hospital Stay: Payer: Self-pay

## 2020-06-21 DIAGNOSIS — R509 Fever, unspecified: Secondary | ICD-10-CM

## 2020-06-21 DIAGNOSIS — D72829 Elevated white blood cell count, unspecified: Secondary | ICD-10-CM

## 2020-06-21 DIAGNOSIS — K859 Acute pancreatitis without necrosis or infection, unspecified: Secondary | ICD-10-CM

## 2020-06-21 LAB — GLUCOSE, CAPILLARY
Glucose-Capillary: 126 mg/dL — ABNORMAL HIGH (ref 70–99)
Glucose-Capillary: 121 mg/dL — ABNORMAL HIGH (ref 70–99)
Glucose-Capillary: 115 mg/dL — ABNORMAL HIGH (ref 70–99)
Glucose-Capillary: 121 mg/dL — ABNORMAL HIGH (ref 70–99)
Glucose-Capillary: 120 mg/dL — ABNORMAL HIGH (ref 70–99)
Glucose-Capillary: 116 mg/dL — ABNORMAL HIGH (ref 70–99)

## 2020-06-21 LAB — BASIC METABOLIC PANEL
Anion gap: 7 (ref 5–15)
BUN: 22 mg/dL — ABNORMAL HIGH (ref 6–20)
CO2: 29 mmol/L (ref 22–32)
Calcium: 8.9 mg/dL (ref 8.9–10.3)
Chloride: 111 mmol/L (ref 98–111)
Creatinine, Ser: 1.5 mg/dL — ABNORMAL HIGH (ref 0.61–1.24)
GFR, Estimated: >60 mL/min (ref >60)
Glucose, Bld: 134 mg/dL — ABNORMAL HIGH (ref 70–99)
Potassium: 3.9 mmol/L (ref 3.5–5.1)
Sodium: 147 mmol/L — ABNORMAL HIGH (ref 135–145)

## 2020-06-21 LAB — CBC
HCT: 27.9 % — ABNORMAL LOW (ref 39.0–52.0)
Hemoglobin: 8.2 g/dL — ABNORMAL LOW (ref 13.0–17.0)
MCH: 30 pg (ref 26.0–34.0)
MCHC: 29.4 g/dL — ABNORMAL LOW (ref 30.0–36.0)
MCV: 102.2 fL — ABNORMAL HIGH (ref 80.0–100.0)
Platelets: 572 10*3/uL — ABNORMAL HIGH (ref 150–400)
RBC: 2.73 MIL/uL — ABNORMAL LOW (ref 4.22–5.81)
RDW: 13.9 % (ref 11.5–15.5)
WBC: 18.9 10*3/uL — ABNORMAL HIGH (ref 4.0–10.5)
nRBC: 0.2 % (ref 0.0–0.2)

## 2020-06-21 LAB — PROCALCITONIN: Procalcitonin: 0.4 ng/mL

## 2020-06-21 LAB — AMYLASE: Amylase: 48 U/L (ref 28–100)

## 2020-06-21 LAB — PHOSPHORUS: Phosphorus: 4.8 mg/dL — ABNORMAL HIGH (ref 2.5–4.6)

## 2020-06-21 LAB — LIPASE, BLOOD: Lipase: 38 U/L (ref 11–51)

## 2020-06-21 LAB — MAGNESIUM: Magnesium: 2.3 mg/dL (ref 1.7–2.4)

## 2020-06-21 MED ORDER — ALBUTEROL SULFATE (2.5 MG/3ML) 0.083% IN NEBU: 2.5000 mg | INHALATION_SOLUTION | RESPIRATORY_TRACT | Status: DC | PRN

## 2020-06-21 MED ORDER — DEXMEDETOMIDINE HCL IN NACL 400 MCG/100ML IV SOLN
0.4000 ug/kg/h | INTRAVENOUS | Status: DC
  Administered 2020-06-21 – 2020-06-22 (×4): 1.6 ug/kg/h via INTRAVENOUS
  Administered 2020-06-22: 1.5 ug/kg/h via INTRAVENOUS
  Administered 2020-06-22: 1 ug/kg/h via INTRAVENOUS
  Administered 2020-06-22: 1.6 ug/kg/h via INTRAVENOUS
  Administered 2020-06-22: 0.775 ug/kg/h via INTRAVENOUS
  Filled 2020-06-21 (×7): qty 100

## 2020-06-21 MED ORDER — CLONAZEPAM 0.5 MG PO TABS
0.5000 mg | ORAL_TABLET | Freq: Two times a day (BID) | ORAL | Status: DC
  Administered 2020-06-21 – 2020-06-22 (×4): 0.5 mg via ORAL
  Filled 2020-06-21 (×4): qty 1

## 2020-06-21 MED ORDER — METOPROLOL TARTRATE 25 MG PO TABS
12.5000 mg | ORAL_TABLET | Freq: Once | ORAL | Status: AC
  Administered 2020-06-21: 12.5 mg via ORAL
  Filled 2020-06-21: qty 1

## 2020-06-21 MED ORDER — SODIUM CHLORIDE 0.9 % IV SOLN
1.0000 g | Freq: Three times a day (TID) | INTRAVENOUS | Status: DC
  Administered 2020-06-21 – 2020-06-23 (×6): 1 g via INTRAVENOUS
  Filled 2020-06-21 (×8): qty 1

## 2020-06-21 MED ORDER — FLEET ENEMA 7-19 GM/118ML RE ENEM: 1.0000 | ENEMA | Freq: Every day | RECTAL | Status: DC | PRN

## 2020-06-21 MED ORDER — LACTULOSE 10 GM/15ML PO SOLN
30.0000 g | Freq: Three times a day (TID) | ORAL | Status: DC
  Administered 2020-06-21: 30 g via ORAL
  Filled 2020-06-21 (×2): qty 60

## 2020-06-21 MED ORDER — FREE WATER
300.0000 mL | Status: DC
  Administered 2020-06-21: 300 mL

## 2020-06-21 MED ORDER — FUROSEMIDE 10 MG/ML IJ SOLN
40.0000 mg | Freq: Every day | INTRAMUSCULAR | Status: AC
  Administered 2020-06-21 – 2020-06-23 (×3): 40 mg via INTRAVENOUS
  Filled 2020-06-21 (×3): qty 4

## 2020-06-21 MED ORDER — IPRATROPIUM-ALBUTEROL 0.5-2.5 (3) MG/3ML IN SOLN
3.0000 mL | Freq: Four times a day (QID) | RESPIRATORY_TRACT | Status: DC
  Administered 2020-06-21 – 2020-06-22 (×6): 3 mL via RESPIRATORY_TRACT
  Filled 2020-06-21 (×7): qty 3

## 2020-06-21 MED ORDER — CLONIDINE HCL 0.3 MG/24HR TD PTWK
0.3000 mg | MEDICATED_PATCH | TRANSDERMAL | Status: DC
  Administered 2020-06-21: 0.3 mg via TRANSDERMAL
  Filled 2020-06-21: qty 1

## 2020-06-21 MED ORDER — METOPROLOL TARTRATE 5 MG/5ML IV SOLN
5.0000 mg | Freq: Four times a day (QID) | INTRAVENOUS | Status: DC | PRN
  Administered 2020-06-21 – 2020-06-23 (×5): 5 mg via INTRAVENOUS
  Filled 2020-06-21 (×4): qty 5

## 2020-06-21 MED ORDER — METOPROLOL TARTRATE 25 MG PO TABS
25.0000 mg | ORAL_TABLET | Freq: Two times a day (BID) | ORAL | Status: DC
  Administered 2020-06-21 – 2020-06-23 (×4): 25 mg
  Filled 2020-06-21 (×4): qty 1

## 2020-06-21 MED ORDER — METOPROLOL TARTRATE 5 MG/5ML IV SOLN
INTRAVENOUS | Status: AC
  Filled 2020-06-21: qty 5

## 2020-06-21 MED ORDER — LABETALOL HCL 5 MG/ML IV SOLN
10.0000 mg | Freq: Once | INTRAVENOUS | Status: AC
  Administered 2020-06-21: 10 mg via INTRAVENOUS

## 2020-06-21 NOTE — Consult Note (Addendum)
PHARMACY CONSULT NOTE - FOLLOW UP  Pharmacy Consult for Electrolyte Monitoring and Replacement   Recent Labs: Potassium (mmol/L)  Date Value  06/21/2020 3.9  08/18/2012 4.3   Magnesium (mg/dL)  Date Value  49/44/9675 2.3   Calcium (mg/dL)  Date Value  91/63/8466 8.9   Calcium, Total (mg/dL)  Date Value  59/93/5701 9.2   Albumin (g/dL)  Date Value  77/93/9030 2.2 (L)  08/18/2012 4.2   Phosphorus (mg/dL)  Date Value  01/05/3006 4.8 (H)   Sodium (mmol/L)  Date Value  06/21/2020 147 (H)  08/18/2012 139    Assessment: 30 yo M admitted withleft glenohumeral jointfracture/dislocation with greater pt s/p left shoulder reduction in ER, pancreatitis, and seizure activity secondary to severe ETOH withdrawal. Pt with gastrocolic ligament abscess s/p drainage on 3/3 and with biliary drain. SBT 3/9. Pt on feeding supplement 4x daily.  Goal of Therapy:  Electrolytes WNL  Plan:  --Continue free water 300 mL per tube q4h for hypernatremia --All other electrolytes WNL, no electrolyte replacement at this time --Follow-up electrolytes with AM labs tomorrow  Reatha Armour, PharmD Pharmacy Resident  06/21/2020 9:49 AM

## 2020-06-21 NOTE — Consult Note (Signed)
NAME: Marcus Small  DOB: 03-23-1991  MRN: [id number]  Date/Time: 06/21/2020 1:23 PM  REQUESTING PROVIDER: Lanney Gins Subjective:  REASON FOR CONSULT: fever ?No history available from patient, chart reviewed Marcus Small is a 30 y.o. 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  He dislocated the left shoulder because of that patient was postictal on arrival and was confused.  In the ED vitals BP 176/122, temp 97.8, heart rate 126, respiratory rate 36, sats 100% oxygen WBC 23.5, Hb 15, platelet 359, creatinine 1.55. CT head and cervical spine normal CT abdomen pelvis showed peripancreatic fat stranding.  The pancreas was uniformly enhancing.  There was small amount of intraperitoneal free fluid and retroperitoneal free fluid.  The findings were consistent with acute interstitial pancreatitis.  Blood culture was sent on 06/12/2020 was negative.  Respiratory panel was negative. On 06/14/2020 patient was intubated He had a Foley catheter placed A repeat CT abdomen done on 06/15/2020 showed acute edematous pancreatitis with progressive inflammation and possible mild necrosis at the uncinate process.  There was inflammatory without visible luminal thrombus or bowel ischemia.  There was a 9 cm nonorganized fluid collection at the gastrocolic ligament.  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  A drain was placed on 08/21/5275 in the gastrocolic collection.And a percutaneous drain within the previously described collection in the gastrocolic ligament which had resolved.  On 06/19/2020 he underwent a repeat CT abdomen which showed moderate pancreatitis  Patient has been on Zosyn since 06/14/2020 Before that he got a dose of cefepime, vancomycin, metronidazole on admission on 06/04/2020. I am asked to see the patient has been spiking fever and is having high white count.   LDA Triple-lumen PICC placed on 06/14/2020 Urethral catheter  06/14/2020 Airway 06/14/2020 JP tube 06/15/2020  OG tube 06/15/2020    Past Medical History:  Diagnosis Date  . Anxiety   . Hypertension     History reviewed. No pertinent surgical history.  Social History   Socioeconomic History  . Marital status: Single    Spouse name: Not on file  . Number of children: Not on file  . Years of education: Not on file  . Highest education level: Not on file  Occupational History  . Not on file  Tobacco Use  . Smoking status: Current Every Day Smoker    Packs/day: 0.50    Types: Cigarettes    Last attempt to quit: 04/17/2015    Years since quitting: 5.1  . Smokeless tobacco: Never Used  Substance and Sexual Activity  . Alcohol use: Yes    Alcohol/week: 18.0 standard drinks    Types: 18 Cans of beer per week    Comment: daily - beer plus 2 pints of liquor a day  . Drug use: Not on file  . Sexual activity: Not on file  Other Topics Concern  . Not on file  Social History Narrative  . Not on file   Social Determinants of Health   Financial Resource Strain: Not on file  Food Insecurity: Not on file  Transportation Needs: Not on file  Physical Activity: Not on file  Stress: Not on file  Social Connections: Not on file  Intimate Partner Violence: Not on file    Family History  Problem Relation Age of Onset  . Hypertension Mother    Allergies  Allergen Reactions  . Peanut-Containing Drug Products Itching  . Shellfish Allergy    I? Current Facility-Administered Medications  Medication Dose Route Frequency Provider Last Rate Last Admin  . metoprolol tartrate (LOPRESSOR) 5 MG/5ML injection           . acetaminophen (TYLENOL) 160 MG/5ML solution 650 mg  650 mg Per Tube Q6H PRN Flora Lipps, MD   650 mg at 06/20/20 1937  . albuterol (PROVENTIL) (2.5 MG/3ML) 0.083% nebulizer solution 2.5 mg  2.5 mg Nebulization Q4H PRN Ottie Glazier, MD      . chlorhexidine gluconate (MEDLINE KIT) (PERIDEX) 0.12 % solution 15 mL  15 mL Mouth Rinse BID  Flora Lipps, MD   15 mL at 06/21/20 0823  . Chlorhexidine Gluconate Cloth 2 % PADS 6 each  6 each Topical Daily Marcelyn Bruins, MD   6 each at 06/21/20 [phone]  . clonazePAM (KLONOPIN) tablet 0.5 mg  0.5 mg Oral BID Tyna Jaksch, MD   0.5 mg at 06/21/20 1941  . dexmedetomidine (PRECEDEX) 400 MCG/100ML (4 mcg/mL) infusion  0.4-1.2 mcg/kg/hr Intravenous Titrated Shawna Clamp, MD 27 mL/hr at 06/21/20 1040 1.2 mcg/kg/hr at 06/21/20 1040  . docusate (COLACE) 50 MG/5ML liquid 100 mg  100 mg Per Tube BID Darel Hong D, NP   100 mg at 06/21/20 0932  . enoxaparin (LOVENOX) injection 52.5 mg  0.5 mg/kg Subcutaneous Q24H Flora Lipps, MD   52.5 mg at 06/21/20 0936  . feeding supplement (PROSource TF) liquid 90 mL  90 mL Per Tube QID Tyler Pita, MD   90 mL at 06/21/20 0933  . feeding supplement (VITAL 1.5 CAL) liquid 1,000 mL  1,000 mL Per Tube Q24H Tyler Pita, MD 25 mL/hr at 06/20/20 2323 1,000 mL at 06/20/20 2323  . fentaNYL (SUBLIMAZE) injection 50 mcg  50 mcg Intravenous Once Darel Hong D, NP      . folic acid injection 1 mg  1 mg Intravenous Daily Nolberto Hanlon, MD   1 mg at 06/21/20 0934  . free water 300 mL  300 mL Per Tube Q4H Tyna Jaksch, MD   300 mL at 06/21/20 7408  . furosemide (LASIX) injection 40 mg  40 mg Intravenous Daily Ottie Glazier, MD      . HYDROmorphone (DILAUDID) 50 mg in sodium chloride 0.9 % 100 mL (0.5 mg/mL) infusion  0.5-4 mg/hr Intravenous Continuous Rust-Chester, Huel Cote, NP   Stopped at 06/21/20 1050  . HYDROmorphone (DILAUDID) bolus via infusion 0.5 mg  0.5 mg Intravenous Q30 min PRN Rust-Chester, Britton L, NP   0.5 mg at 06/20/20 1549  . ipratropium-albuterol (DUONEB) 0.5-2.5 (3) MG/3ML nebulizer solution 3 mL  3 mL Nebulization Q6H Aleskerov, Fuad, MD      . lactulose (CHRONULAC) 10 GM/15ML solution 30 g  30 g Oral TID Tyna Jaksch, MD   30 g at 06/21/20 0947  . LORazepam (ATIVAN) injection 2 mg  2 mg Intravenous Q4H PRN  Flora Lipps, MD   2 mg at 06/18/20 1648  . MEDLINE mouth rinse  15 mL Mouth Rinse 10 times per day Flora Lipps, MD   15 mL at 06/21/20 1203  . metoCLOPramide (REGLAN) injection 5 mg  5 mg Intravenous Q8H Tyler Pita, MD   5 mg at 06/21/20 0512  . metoprolol tartrate (LOPRESSOR) injection 5 mg  5 mg Intravenous Q6H PRN Ottie Glazier, MD   5 mg at 06/21/20 1318  . metoprolol tartrate (LOPRESSOR) tablet 12.5 mg  12.5 mg Per Tube BID Tyna Jaksch, MD   12.5 mg at 06/21/20 0931  . midazolam (VERSED) 50  mg/50 mL (1 mg/mL) premix infusion  0-10 mg/hr Intravenous Continuous Bradly Bienenstock, NP   Stopped at 06/21/20 1015  . midazolam (VERSED) bolus via infusion 1-2 mg  1-2 mg Intravenous Q2H PRN Bradly Bienenstock, NP   2 mg at 06/20/20 1931  . multivitamin with minerals tablet 1 tablet  1 tablet Oral Daily Marcelyn Bruins, MD   1 tablet at 06/21/20 0932  . ondansetron (ZOFRAN) injection 4 mg  4 mg Intravenous Q6H PRN Shawna Clamp, MD   4 mg at 06/12/20 1321  . oxyCODONE (Oxy IR/ROXICODONE) immediate release tablet 5 mg  5 mg Per Tube Q6H Tyna Jaksch, MD   5 mg at 06/21/20 5027  . pantoprazole (PROTONIX) injection 40 mg  40 mg Intravenous Q24H Awilda Bill, NP   40 mg at 06/20/20 2130  . piperacillin-tazobactam (ZOSYN) IVPB 3.375 g  3.375 g Intravenous Q8H Kasa, Kurian, MD 12.5 mL/hr at 06/21/20 0700 Infusion Verify at 06/21/20 0700  . sodium chloride flush (NS) 0.9 % injection 10-40 mL  10-40 mL Intracatheter Q12H Nolberto Hanlon, MD   10 mL at 06/20/20 2133  . sodium chloride flush (NS) 0.9 % injection 10-40 mL  10-40 mL Intracatheter PRN Nolberto Hanlon, MD   10 mL at 06/16/20 1400  . sodium chloride flush (NS) 0.9 % injection 3 mL  3 mL Intravenous Q12H Marcelyn Bruins, MD   3 mL at 06/21/20 0947  . sodium chloride flush (NS) 0.9 % injection 5 mL  5 mL Intracatheter Q8H Sandi Mariscal, MD   5 mL at 06/21/20 0513  . sodium phosphate (FLEET) 7-19 GM/118ML enema 1 enema  1  enema Rectal Daily PRN Tyna Jaksch, MD      . thiamine tablet 100 mg  100 mg Oral Daily Marcelyn Bruins, MD   100 mg at 06/21/20 0932   Or  . thiamine (B-1) injection 100 mg  100 mg Intravenous Daily Marcelyn Bruins, MD   100 mg at 06/20/20 1024  . vecuronium (NORCURON) injection 10 mg  10 mg Intravenous Q1H PRN Flora Lipps, MD   10 mg at 06/20/20 0142     Abtx:  Anti-infectives (From admission, onward)   Start     Dose/Rate Route Frequency Ordered Stop   06/14/20 1100  piperacillin-tazobactam (ZOSYN) IVPB 3.375 g        3.375 g 12.5 mL/hr over 240 Minutes Intravenous Every 8 hours 06/14/20 1009     06/12/20 0015  vancomycin (VANCOCIN) IVPB 1000 mg/200 mL premix       "Followed by" Linked Group Details   1,000 mg 200 mL/hr over 60 Minutes Intravenous  Once 06/12/20 0002 06/12/20 0313   06/12/20 0015  vancomycin (VANCOREADY) IVPB 1250 mg/250 mL       "Followed by" Linked Group Details   1,250 mg 166.7 mL/hr over 90 Minutes Intravenous  Once 06/12/20 0002 06/12/20 0631   06/12/20 0000  ceFEPIme (MAXIPIME) 2 g in sodium chloride 0.9 % 100 mL IVPB        2 g 200 mL/hr over 30 Minutes Intravenous  Once 06/11/20 2352 06/12/20 0115   06/12/20 0000  metroNIDAZOLE (FLAGYL) IVPB 500 mg        500 mg 100 mL/hr over 60 Minutes Intravenous  Once 06/11/20 2352 06/12/20 0232   06/12/20 0000  vancomycin (VANCOCIN) IVPB 1000 mg/200 mL premix  Status:  Discontinued        1,000 mg 200 mL/hr over 60 Minutes  Intravenous  Once 06/11/20 2352 06/12/20 0000      REVIEW OF SYSTEMS:  NA Objective:  VITALS:  BP (!) 163/94   Pulse (!) 111   Temp (!) 100.4 F (38 C)   Resp 17   Ht 5' 5" (1.651 m)   Wt 103.2 kg   SpO2 97%   BMI 37.86 kg/m  PHYSICAL EXAM:  General: self extubated, on bipap Head: Normocephalic, without obvious abnormality, atraumatic. Eyes: Conjunctivae clear, anicteric sclerae. Pupils are equal ENT cannot examine Lungs: b/l air entry Heart:Tachycardia Abdomen:   distended. Bowel sounds present Foley catheter rt PICC Extremities: edematous Skin: No rashes or lesions. Or bruising Lymph: Cervical, supraclavicular normal. Neurologic: cannot assess Pertinent Labs Lab Results CBC      Component Value Date/Time   WBC 18.9 (H) 06/21/2020 0400   RBC 2.73 (L) 06/21/2020 0400   HGB 8.2 (L) 06/21/2020 0400   HGB 14.9 08/18/2012 1154   HCT 27.9 (L) 06/21/2020 0400   HCT 44.8 08/18/2012 1154   PLT 572 (H) 06/21/2020 0400   PLT 349 08/18/2012 1154   MCV 102.2 (H) 06/21/2020 0400   MCV 89 08/18/2012 1154   MCH 30.0 06/21/2020 0400   MCHC 29.4 (L) 06/21/2020 0400   RDW 13.9 06/21/2020 0400   RDW 13.2 08/18/2012 1154   LYMPHSABS 0.6 (L) 06/11/2020 2132   MONOABS 1.0 06/11/2020 2132   EOSABS 0.0 06/11/2020 2132   BASOSABS 0.1 06/11/2020 2132    CMP Latest Ref Rng & Units 06/21/2020 06/20/2020 06/19/2020  Glucose 70 - 99 mg/dL 134(H) 146(H) 115(H)  BUN 6 - 20 mg/dL 22(H) 14 11  Creatinine 0.61 - 1.24 mg/dL 1.50(H) 1.60(H) 1.72(H)  Sodium 135 - 145 mmol/L 147(H) 144 140  Potassium 3.5 - 5.1 mmol/L 3.9 3.8 4.1  Chloride 98 - 111 mmol/L 111 110 107  CO2 22 - 32 mmol/L _0 Calcium 8.9 - 10.3 mg/dL 8.9 8.1(L) 8.8(L)  Total Protein 6.5 - 8.1 g/dL - - -  Total Bilirubin 0.3 - 1.2 mg/dL - - -  Alkaline Phos 38 - 126 U/L - - -  AST 15 - 41 U/L - - -  ALT 0 - 44 U/L - - -      Microbiology: Recent Results (from the past 240 hour(s))  Resp Panel by RT-PCR (Flu A&B, Covid) Nasopharyngeal Swab     Status: None   Collection Time: 06/11/20  9:32 PM   Specimen: Nasopharyngeal Swab; Nasopharyngeal(NP) swabs in vial transport medium  Result Value Ref Range Status   SARS Coronavirus 2 by RT PCR NEGATIVE NEGATIVE Final    Comment: (NOTE) SARS-CoV-2 target nucleic acids are NOT DETECTED.  The SARS-CoV-2 RNA is generally detectable in upper respiratory specimens during the acute phase of infection. The lowest concentration of SARS-CoV-2 viral copies  this assay can detect is 138 copies/mL. A negative result does not preclude SARS-Cov-2 infection and should not be used as the sole basis for treatment or other patient management decisions. A negative result may occur with  improper specimen collection/handling, submission of specimen other than nasopharyngeal swab, presence of viral mutation(s) within the areas targeted by this assay, and inadequate number of viral copies(<138 copies/mL). A negative result must be combined with clinical observations, patient history, and epidemiological information. The expected result is Negative.  Fact Sheet for Patients:  EntrepreneurPulse.com.au  Fact Sheet for Healthcare Providers:  IncredibleEmployment.be  This test is no t yet approved or cleared by the [place] and  has been authorized for detection and/or diagnosis of SARS-CoV-2 by FDA under an Emergency Use Authorization (EUA). This EUA will remain  in effect (meaning this test can be used) for the duration of the COVID-19 declaration under Section 564(b)(1) of the Act, 21 U.S.C.section 360bbb-3(b)(1), unless the authorization is terminated  or revoked sooner.       Influenza A by PCR NEGATIVE NEGATIVE Final   Influenza B by PCR NEGATIVE NEGATIVE Final    Comment: (NOTE) The Xpert Xpress SARS-CoV-2/FLU/RSV plus assay is intended as an aid in the diagnosis of influenza from Nasopharyngeal swab specimens and should not be used as a sole basis for treatment. Nasal washings and aspirates are unacceptable for Xpert Xpress SARS-CoV-2/FLU/RSV testing.  Fact Sheet for Patients: EntrepreneurPulse.com.au  Fact Sheet for Healthcare Providers: IncredibleEmployment.be  This test is not yet approved or cleared by the [place] FDA and has been authorized for detection and/or diagnosis of SARS-CoV-2 by FDA under an Emergency Use Authorization (EUA). This EUA  will remain in effect (meaning this test can be used) for the duration of the COVID-19 declaration under Section 564(b)(1) of the Act, 21 U.S.C. section 360bbb-3(b)(1), unless the authorization is terminated or revoked.  Performed at [facility], [address] [place], [place] [address]   Blood culture (routine x 2)     Status: None   Collection Time: 06/12/20 12:17 AM   Specimen: BLOOD  Result Value Ref Range Status   Specimen Description BLOOD RIGHT FA  Final   Special Requests   Final    BOTTLES DRAWN AEROBIC AND ANAEROBIC Blood Culture results may not be optimal due to an inadequate volume of blood received in culture bottles   Culture   Final    NO GROWTH 5 DAYS Performed at [facility], [address] [place], [place] [address]    Report Status 06/17/2020 FINAL  Final  Blood culture (routine x 2)     Status: None   Collection Time: 06/12/20 12:45 AM   Specimen: BLOOD  Result Value Ref Range Status   Specimen Description BLOOD RIGHT HAND  Final   Special Requests   Final    BOTTLES DRAWN AEROBIC AND ANAEROBIC Blood Culture results may not be optimal due to an inadequate volume of blood received in culture bottles   Culture   Final    NO GROWTH 5 DAYS Performed at [facility], [address] [place], [place] [address]    Report Status 06/17/2020 FINAL  Final  MRSA PCR Screening     Status: None   Collection Time: 06/12/20  3:27 AM   Specimen: Nasopharyngeal  Result Value Ref Range Status   MRSA by PCR NEGATIVE NEGATIVE Final    Comment:        The GeneXpert MRSA Assay (FDA approved for NASAL specimens only), is one component of a comprehensive MRSA colonization surveillance program. It is not intended to diagnose MRSA infection nor to guide or monitor treatment for MRSA infections. Performed at [facility], [place], [place] [address]   Respiratory (~20 pathogens) panel by PCR     Status: None    Collection Time: 06/14/20  5:30 PM   Specimen: Nasopharyngeal Swab; Respiratory  Result Value Ref Range Status   Adenovirus NOT DETECTED NOT DETECTED Final   Coronavirus 229E NOT DETECTED NOT DETECTED Final    Comment: (NOTE) The Coronavirus on the Respiratory Panel, DOES NOT test for the novel  Coronavirus (2019 nCoV)  Coronavirus HKU1 NOT DETECTED NOT DETECTED Final   Coronavirus NL63 NOT DETECTED NOT DETECTED Final   Coronavirus OC43 NOT DETECTED NOT DETECTED Final   Metapneumovirus NOT DETECTED NOT DETECTED Final   Rhinovirus / Enterovirus NOT DETECTED NOT DETECTED Final   Influenza A NOT DETECTED NOT DETECTED Final   Influenza B NOT DETECTED NOT DETECTED Final   Parainfluenza Virus 1 NOT DETECTED NOT DETECTED Final   Parainfluenza Virus 2 NOT DETECTED NOT DETECTED Final   Parainfluenza Virus 3 NOT DETECTED NOT DETECTED Final   Parainfluenza Virus 4 NOT DETECTED NOT DETECTED Final   Respiratory Syncytial Virus NOT DETECTED NOT DETECTED Final   Bordetella pertussis NOT DETECTED NOT DETECTED Final   Bordetella Parapertussis NOT DETECTED NOT DETECTED Final   Chlamydophila pneumoniae NOT DETECTED NOT DETECTED Final   Mycoplasma pneumoniae NOT DETECTED NOT DETECTED Final    Comment: Performed at [facility], [place] [address] [place], [place] [address]  Culture, Respiratory w Gram Stain     Status: None   Collection Time: 06/14/20  5:46 PM   Specimen: Tracheal Aspirate; Respiratory  Result Value Ref Range Status   Specimen Description   Final    TRACHEAL ASPIRATE Performed at [facility], [address], [place], [place] [address]    Special Requests   Final    NONE Performed at [facility], [address] [place], [place] [address]    Gram Stain   Final    MODERATE WBC PRESENT,BOTH PMN AND MONONUCLEAR MODERATE GRAM NEGATIVE RODS FEW GRAM POSITIVE COCCI IN PAIRS IN CLUSTERS    Culture   Final    FEW Normal respiratory flora-no Staph aureus or  Pseudomonas seen Performed at [facility], [address] [place], [place] [address]    Report Status 06/17/2020 FINAL  Final  Aerobic/Anaerobic Culture (surgical/deep wound)     Status: None   Collection Time: 06/15/20 12:29 PM   Specimen: Abscess  Result Value Ref Range Status   Specimen Description   Final    ABSCESS Performed at [facility], [address], [place], [place] [address]    Special Requests   Final    NONE Performed at [facility], [address] [place], [place] [address]    Gram Stain NO ORGANISMS SEEN  Final   Culture   Final    No growth aerobically or anaerobically. Performed at [facility], [place] [address] [place], [place] [address]    Report Status 06/20/2020 FINAL  Final  CULTURE, BLOOD (ROUTINE X 2) w Reflex to ID Panel     Status: None (Preliminary result)   Collection Time: 06/18/20  7:47 PM   Specimen: BLOOD  Result Value Ref Range Status   Specimen Description BLOOD BLOOD LEFT HAND  Final   Special Requests   Final    BOTTLES DRAWN AEROBIC AND ANAEROBIC Blood Culture results may not be optimal due to an excessive volume of blood received in culture bottles   Culture   Final    NO GROWTH 3 DAYS Performed at [facility], [address] [place],  [address]    Report Status PENDING  Incomplete  CULTURE, BLOOD (ROUTINE X 2) w Reflex to ID Panel     Status: None (Preliminary result)   Collection Time: 06/18/20  7:56 PM   Specimen: BLOOD  Result Value Ref Range Status   Specimen Description BLOOD BLOOD RIGHT HAND  Final   Special Requests  Final    BOTTLES DRAWN AEROBIC AND ANAEROBIC Blood Culture results may not be optimal due to an excessive volume of blood received in culture bottles   Culture   Final    NO GROWTH 3 DAYS Performed at [facility], [address], [place], [place] [address]    Report Status PENDING  Incomplete    IMAGING RESULTS:  I have personally reviewed  the films ? Impression/Recommendation ? Acute pancreatitis   Fever with leucocytosis- no pneumonia, UTI no diarrhea Likely source pancreatitis- will DC zosyn and change to meropenem  Seizures due to alcohol withdrawal. Was admitted in post ictal phase   Acute hypoxic respiratory failure was intubated , self extubated today ? ? ___________________________________________________ Discussed the management with the care team Note:  This document was prepared using Dragon voice recognition software and may include unintentional dictation errors.

## 2020-06-21 NOTE — Progress Notes (Signed)
RT called to room for pt self extubation.

## 2020-06-21 NOTE — Progress Notes (Signed)
[facility]  ROUNDING NOTE   Subjective:   Tmax 101.6 Wbc 18.9 (18.1)  UOP 2188m  Na 147 (144)  Objective:  Vital signs in last 24 hours:  Temp:  [98.6 F (37 C)-101.66 F (38.7 C)] 98.6 F (37 C) (03/09 0700) Pulse Rate:  [103-117] 104 (03/09 0739) Resp:  [7-18] 15 (03/09 0739) BP: (101-141)/(31-77) 123/63 (03/09 0700) SpO2:  [94 %-100 %] 98 % (03/09 0739) FiO2 (%):  [30 %] 30 % (03/09 0739) Weight:  [103.2 kg] 103.2 kg (03/09 0250)  Weight change: 0.1 kg Filed Weights   06/19/20 0449 06/20/20 0444 06/21/20 0250  Weight: 103.1 kg 103.1 kg 103.2 kg    Intake/Output: I/O last 3 completed shifts: In: 4444.4 [I.V.:1519.4; Other:5; NUP/BD:5789.7 IV Piggyback:275.5] Out: 58478[Urine:3675; Emesis/NG output:1500; Drains:5]   Intake/Output this shift:  No intake/output data recorded.  Physical Exam: General: Critically ill  Head: ETT   Eyes: PERRL  Neck: trachea midline  Lungs:  PRVC 30%  Heart: Regular rate and rhythm  Abdomen:  Soft  Extremities:  no peripheral edema.  Neurologic: Intubated and sedated  Skin: No lesions  GU: Foley with urine    Basic Metabolic Panel: Recent Labs  Lab 06/18/20 0400 06/19/20 0359 06/19/20 1233 06/20/20 0358 06/21/20 0400  NA 144 142 140 144 147*  K 3.2* 3.5 4.1 3.8 3.9  CL 111 108 107 110 111  CO2 _0 GLUCOSE 166* 166* 115* 146* 134*  BUN _1 22*  CREATININE 1.73* 1.49* 1.72* 1.60* 1.50*  CALCIUM 8.3* 8.7* 8.8* 8.1* 8.9  MG 1.8 1.9 2.0 1.8 2.3  PHOS 4.6 4.4 5.2* 5.5* 4.8*    Liver Function Tests: Recent Labs  Lab 06/15/20 0523 06/16/20 0503 06/17/20 0450 06/18/20 0400 06/19/20 1233  AST 73* 40 28 36  --   ALT _2 --   ALKPHOS 73 91 91 110  --   BILITOT 2.1* 1.6* 1.4* 1.4*  --   PROT 6.3* 6.0* 6.0* 5.9*  --   ALBUMIN 2.3* 2.0* 2.0* 1.9* 2.2*   Recent Labs  Lab 06/17/20 0450 06/18/20 0400 06/19/20 0359 06/20/20 0358 06/21/20 0400  LIPASE 39 38 44 53* 38   AMYLASE 71 64 67 59 48   No results for input(s): AMMONIA in the last 168 hours.  CBC: Recent Labs  Lab 06/17/20 0450 06/18/20 0400 06/19/20 0315 06/19/20 1233 06/20/20 0358 06/21/20 0400  WBC 11.0* 13.2* 12.2*  --  18.1* 18.9*  HGB 10.4* 9.5* 7.7* 9.8* 8.2* 8.2*  HCT 34.0* 31.2* 26.1* 32.4* 27.9* 27.9*  MCV 100.6* 98.4 102.0*  --  102.6* 102.2*  PLT 382 448* 368  --  499* 572*    Cardiac Enzymes: No results for input(s): CKTOTAL, CKMB, CKMBINDEX, TROPONINI in the last 168 hours.  BNP: Invalid input(s): POCBNP  CBG: Recent Labs  Lab 06/20/20 1621 06/20/20 1934 06/20/20 2309 06/21/20 0305 06/21/20 0719  GLUCAP 134* 133* 127* 126* 121*    Microbiology: Results for orders placed or performed during the hospital encounter of 06/11/20  Resp Panel by RT-PCR (Flu A&B, Covid) Nasopharyngeal Swab     Status: None   Collection Time: 06/11/20  9:32 PM   Specimen: Nasopharyngeal Swab; Nasopharyngeal(NP) swabs in vial transport medium  Result Value Ref Range Status   SARS Coronavirus 2 by RT PCR NEGATIVE NEGATIVE Final    Comment: (NOTE) SARS-CoV-2 target nucleic acids are NOT DETECTED.  The SARS-CoV-2 RNA is generally  detectable in upper respiratory specimens during the acute phase of infection. The lowest concentration of SARS-CoV-2 viral copies this assay can detect is 138 copies/mL. A negative result does not preclude SARS-Cov-2 infection and should not be used as the sole basis for treatment or other patient management decisions. A negative result may occur with  improper specimen collection/handling, submission of specimen other than nasopharyngeal swab, presence of viral mutation(s) within the areas targeted by this assay, and inadequate number of viral copies(<138 copies/mL). A negative result must be combined with clinical observations, patient history, and epidemiological information. The expected result is Negative.  Fact Sheet for Patients:   EntrepreneurPulse.com.au  Fact Sheet for Healthcare Providers:  IncredibleEmployment.be  This test is no t yet approved or cleared by the [place] FDA and  has been authorized for detection and/or diagnosis of SARS-CoV-2 by FDA under an Emergency Use Authorization (EUA). This EUA will remain  in effect (meaning this test can be used) for the duration of the COVID-19 declaration under Section 564(b)(1) of the Act, 21 U.S.C.section 360bbb-3(b)(1), unless the authorization is terminated  or revoked sooner.       Influenza A by PCR NEGATIVE NEGATIVE Final   Influenza B by PCR NEGATIVE NEGATIVE Final    Comment: (NOTE) The Xpert Xpress SARS-CoV-2/FLU/RSV plus assay is intended as an aid in the diagnosis of influenza from Nasopharyngeal swab specimens and should not be used as a sole basis for treatment. Nasal washings and aspirates are unacceptable for Xpert Xpress SARS-CoV-2/FLU/RSV testing.  Fact Sheet for Patients: EntrepreneurPulse.com.au  Fact Sheet for Healthcare Providers: IncredibleEmployment.be  This test is not yet approved or cleared by the [place] FDA and has been authorized for detection and/or diagnosis of SARS-CoV-2 by FDA under an Emergency Use Authorization (EUA). This EUA will remain in effect (meaning this test can be used) for the duration of the COVID-19 declaration under Section 564(b)(1) of the Act, 21 U.S.C. section 360bbb-3(b)(1), unless the authorization is terminated or revoked.  Performed at [facility], [address] [place], [place] [address]   Blood culture (routine x 2)     Status: None   Collection Time: 06/12/20 12:17 AM   Specimen: BLOOD  Result Value Ref Range Status   Specimen Description BLOOD RIGHT FA  Final   Special Requests   Final    BOTTLES DRAWN AEROBIC AND ANAEROBIC Blood Culture results may not be optimal due to an inadequate volume  of blood received in culture bottles   Culture   Final    NO GROWTH 5 DAYS Performed at [facility], [address] [place], [place] [address]    Report Status 06/17/2020 FINAL  Final  Blood culture (routine x 2)     Status: None   Collection Time: 06/12/20 12:45 AM   Specimen: BLOOD  Result Value Ref Range Status   Specimen Description BLOOD RIGHT HAND  Final   Special Requests   Final    BOTTLES DRAWN AEROBIC AND ANAEROBIC Blood Culture results may not be optimal due to an inadequate volume of blood received in culture bottles   Culture   Final    NO GROWTH 5 DAYS Performed at [facility], [address], [place], [place] [address]    Report Status 06/17/2020 FINAL  Final  MRSA PCR Screening     Status: None   Collection Time: 06/12/20  3:27 AM   Specimen: Nasopharyngeal  Result Value Ref Range Status   MRSA by PCR NEGATIVE  NEGATIVE Final    Comment:        The GeneXpert MRSA Assay (FDA approved for NASAL specimens only), is one component of a comprehensive MRSA colonization surveillance program. It is not intended to diagnose MRSA infection nor to guide or monitor treatment for MRSA infections. Performed at [facility], [place], [place] [address]   Respiratory (~20 pathogens) panel by PCR     Status: None   Collection Time: 06/14/20  5:30 PM   Specimen: Nasopharyngeal Swab; Respiratory  Result Value Ref Range Status   Adenovirus NOT DETECTED NOT DETECTED Final   Coronavirus 229E NOT DETECTED NOT DETECTED Final    Comment: (NOTE) The Coronavirus on the Respiratory Panel, DOES NOT test for the novel  Coronavirus (2019 nCoV)    Coronavirus HKU1 NOT DETECTED NOT DETECTED Final   Coronavirus NL63 NOT DETECTED NOT DETECTED Final   Coronavirus OC43 NOT DETECTED NOT DETECTED Final   Metapneumovirus NOT DETECTED NOT DETECTED Final   Rhinovirus / Enterovirus NOT DETECTED NOT DETECTED Final   Influenza A NOT DETECTED NOT  DETECTED Final   Influenza B NOT DETECTED NOT DETECTED Final   Parainfluenza Virus 1 NOT DETECTED NOT DETECTED Final   Parainfluenza Virus 2 NOT DETECTED NOT DETECTED Final   Parainfluenza Virus 3 NOT DETECTED NOT DETECTED Final   Parainfluenza Virus 4 NOT DETECTED NOT DETECTED Final   Respiratory Syncytial Virus NOT DETECTED NOT DETECTED Final   Bordetella pertussis NOT DETECTED NOT DETECTED Final   Bordetella Parapertussis NOT DETECTED NOT DETECTED Final   Chlamydophila pneumoniae NOT DETECTED NOT DETECTED Final   Mycoplasma pneumoniae NOT DETECTED NOT DETECTED Final    Comment: Performed at [facility], [place], [place], [place] [address]  Culture, Respiratory w Gram Stain     Status: None   Collection Time: 06/14/20  5:46 PM   Specimen: Tracheal Aspirate; Respiratory  Result Value Ref Range Status   Specimen Description   Final    TRACHEAL ASPIRATE Performed at [facility], [address] [place], [place] [address]    Special Requests   Final    NONE Performed at [facility], [address] [place], [place] [address]    Gram Stain   Final    MODERATE WBC PRESENT,BOTH PMN AND MONONUCLEAR MODERATE GRAM NEGATIVE RODS FEW GRAM POSITIVE COCCI IN PAIRS IN CLUSTERS    Culture   Final    FEW Normal respiratory flora-no Staph aureus or Pseudomonas seen Performed at [facility], [address], [place], [place] [address]    Report Status 06/17/2020 FINAL  Final  Aerobic/Anaerobic Culture (surgical/deep wound)     Status: None   Collection Time: 06/15/20 12:29 PM   Specimen: Abscess  Result Value Ref Range Status   Specimen Description   Final    ABSCESS Performed at [facility], [address] [place], [place] [address]    Special Requests   Final    NONE Performed at [facility] [address] [place], [place] [address]    Gram Stain NO ORGANISMS SEEN  Final   Culture   Final    No growth aerobically or  anaerobically. Performed at [facility], [place] [address], [place], [place] [address]    Report Status 06/20/2020 FINAL  Final  CULTURE, BLOOD (ROUTINE X 2) w Reflex to ID Panel     Status: None (Preliminary result)   Collection Time: 06/18/20  7:47  PM   Specimen: BLOOD  Result Value Ref Range Status   Specimen Description BLOOD BLOOD LEFT HAND  Final   Special Requests   Final    BOTTLES DRAWN AEROBIC AND ANAEROBIC Blood Culture results may not be optimal due to an excessive volume of blood received in culture bottles   Culture   Final    NO GROWTH 3 DAYS Performed at [facility], [address] [place], [place] [address]    Report Status PENDING  Incomplete  CULTURE, BLOOD (ROUTINE X 2) w Reflex to ID Panel     Status: None (Preliminary result)   Collection Time: 06/18/20  7:56 PM   Specimen: BLOOD  Result Value Ref Range Status   Specimen Description BLOOD BLOOD RIGHT HAND  Final   Special Requests   Final    BOTTLES DRAWN AEROBIC AND ANAEROBIC Blood Culture results may not be optimal due to an excessive volume of blood received in culture bottles   Culture   Final    NO GROWTH 3 DAYS Performed at [facility], [address] [place], [place] [address]    Report Status PENDING  Incomplete    Coagulation Studies: No results for input(s): LABPROT, INR in the last 72 hours.  Urinalysis: No results for input(s): COLORURINE, LABSPEC, PHURINE, GLUCOSEU, HGBUR, BILIRUBINUR, KETONESUR, PROTEINUR, UROBILINOGEN, NITRITE, LEUKOCYTESUR in the last 72 hours.  Invalid input(s): APPERANCEUR    Imaging: CT ABDOMEN PELVIS WO CONTRAST  Result Date: 06/19/2020 CLINICAL DATA:  Peritonitis or perforations suspected. Follow-up of pancreatitis. Status post drainage of peripancreatic fluid collection 06/15/2020. EXAM: CT ABDOMEN AND PELVIS WITHOUT CONTRAST TECHNIQUE: Multidetector CT imaging of the abdomen and pelvis was performed following the standard protocol  without IV contrast. COMPARISON:  06/15/2020 abdominopelvic CT. FINDINGS: Lower chest: Left greater than right base atelectasis. Normal heart size with similar small left pleural effusion. Hepatobiliary: Multifactorial degradation, including arm position, lack of IV contrast. Grossly normal noncontrast appearance of the liver, gallbladder, biliary tract. Pancreas: Moderate pancreatitis is similar, as evidenced by enlargement of and edema surrounding the pancreas diffusely. Percutaneous drain within the previously described collection in the gastrocolic ligament, which has resolved. No new well-defined fluid collection, given above limitations. Spleen: Grossly normal spleen. Adrenals/Urinary Tract: Normal adrenal glands. No renal calculi or hydronephrosis. No hydroureter or ureteric calculi. Foley catheter within the urinary bladder. Stomach/Bowel: Feeding tube terminates in the transverse duodenum. Normal colon, appendix, and terminal ileum. Otherwise normal small bowel. Vascular/Lymphatic: Normal caliber of the aorta and branch vessels. Mildly prominent abdominal retroperitoneal nodes are likely reactive. No pelvic adenopathy. Reproductive: Normal prostate. Other: Small volume abdominopelvic fluid, minimally decreased. No free intraperitoneal air. Anasarca. Presumably iatrogenic gas within the anterior abdominal wall subcutaneous tissues. Musculoskeletal: No acute osseous abnormality. IMPRESSION: 1. Multifactorial degradation, arm position, and lack of IV contrast. 2. Similar moderate pancreatitis. Resolution of previously described gastrocolic ligament fluid collection. 3. Decreased small volume abdominopelvic fluid. 4. Similar small left pleural effusion with bibasilar atelectasis. Electronically Signed   By: Abigail Miyamoto M.D.   On: 06/19/2020 10:17   DG Abd 1 View  Result Date: 06/21/2020 CLINICAL DATA:  Ileus. EXAM: ABDOMEN - 1 VIEW COMPARISON:  CT 06/19/2020.   06/18/2020. FINDINGS: Feeding tube in  unchanged position. Left upper quadrant drainage catheter in stable position. Mild colonic prominence. No small bowel distention. No free air. IMPRESSION: 1. Feeding tube in unchanged position. Left upper quadrant drainage catheter stable position. 2.  Mild colonic prominence. No small bowel distention. No  free air. Electronically Signed   By: Marcello Moores  Register   On: 06/21/2020 08:37     Medications:   . albumin human Stopped (06/20/20 1830)  . dexmedetomidine (PRECEDEX) IV infusion 1.2 mcg/kg/hr (06/21/20 0700)  . HYDROmorphone 4 mg/hr (06/21/20 0700)  . midazolam 4 mg/hr (06/21/20 0821)  . piperacillin-tazobactam (ZOSYN)  IV 12.5 mL/hr at 06/21/20 0700   . albuterol  2.5 mg Nebulization Q6H  . chlorhexidine gluconate (MEDLINE KIT)  15 mL Mouth Rinse BID  . Chlorhexidine Gluconate Cloth  6 each Topical Daily  . clonazePAM  0.5 mg Oral BID  . docusate  100 mg Per Tube BID  . enoxaparin (LOVENOX) injection  0.5 mg/kg Subcutaneous Q24H  . feeding supplement (PROSource TF)  90 mL Per Tube QID  . feeding supplement (VITAL 1.5 CAL)  1,000 mL Per Tube Q24H  . fentaNYL (SUBLIMAZE) injection  50 mcg Intravenous Once  . folic acid  1 mg Intravenous Daily  . free water  300 mL Per Tube Q4H  . mouth rinse  15 mL Mouth Rinse 10 times per day  . metoCLOPramide (REGLAN) injection  5 mg Intravenous Q8H  . metoprolol tartrate  12.5 mg Per Tube BID  . multivitamin with minerals  1 tablet Oral Daily  . oxyCODONE  5 mg Per Tube Q6H  . pantoprazole (PROTONIX) IV  40 mg Intravenous Q24H  . sodium chloride flush  10-40 mL Intracatheter Q12H  . sodium chloride flush  3 mL Intravenous Q12H  . sodium chloride flush  5 mL Intracatheter Q8H  . thiamine  100 mg Oral Daily   Or  . thiamine  100 mg Intravenous Daily   acetaminophen (TYLENOL) oral liquid 160 mg/5 mL, HYDROmorphone, LORazepam, midazolam, ondansetron (ZOFRAN) IV, sodium chloride flush, vecuronium  Assessment/ Plan:  Mr. Marcus Small is a  30 y.o. black male with history of left glenohumeral joint dislocation with greater tuberosity fracture status post left shoulder reduction, hypertension, anxiety, alcohol abuse, depression, who was admitted to [facility] on 06/11/2020 for evaluation of seizure and found to have severe pancreatitis.  1.  Acute kidney injury secondary to acute tubular necrosis. Baseline creatinine of 1.04 with normal GFR on 12/01/2018.  Acute kidney injury secondary to ATN due to severe underlying pancreatitis and IV contrast exposure on 06/15/20.   No indication for dialysis.  Creatinine stable. Nonoliguric urine output.   2.  Hypernatremia. Free water deficit of 3.1 liters.  - Increased free water flushes - Consider restarting dextrose infusion. If not extubated today.   3.  Acute respiratory failure: requiring intubation and mechanical ventilation  4. Anemia with renal failure: hemoglobin 8.2, no indication for ESA   LOS: 9 Sarath Kolluru 3/9/20228:59 AM

## 2020-06-21 NOTE — Plan of Care (Signed)
  Problem: Clinical Measurements: Goal: Cardiovascular complication will be avoided Outcome: Progressing   Problem: Activity: Goal: Risk for activity intolerance will decrease Outcome: Progressing   Problem: Nutrition: Goal: Adequate nutrition will be maintained Outcome: Progressing   Problem: Elimination: Goal: Will not experience complications related to bowel motility Outcome: Progressing Goal: Will not experience complications related to urinary retention Outcome: Progressing   Problem: Pain Managment: Goal: General experience of comfort will improve Outcome: Progressing   Problem: Safety: Goal: Ability to remain free from injury will improve Outcome: Progressing   Problem: Skin Integrity: Goal: Risk for impaired skin integrity will decrease Outcome: Progressing   Problem: Education: Goal: Knowledge of General Education information will improve Description: Including pain rating scale, medication(s)/side effects and non-pharmacologic comfort measures Outcome: Not Progressing   Problem: Clinical Measurements: Goal: Respiratory complications will improve Outcome: Not Progressing   Problem: Coping: Goal: Level of anxiety will decrease Outcome: Not Progressing

## 2020-06-21 NOTE — Progress Notes (Signed)
NAME:  Marcus Small, MRN:  [id number], DOB:  09-15-1990, LOS: 9 ADMISSION DATE:  06/11/2020, CONSULTATION DATE:  06/20/20 REFERRING MD:  Mikki Harbor, CHIEF COMPLAINT:  Etoh abuse  Brief History:  This is a 30 yo male who presents with etoh withdrawal with glenohumeral joint dislocation. Also complicated by pancreatitis  History of Present Illness:  Marcus Small is an 30 y.o. male admitted withleft glenohumeral joint fracture/dislocation with greater pt s/p left shoulder reduction in ER; pancreatitis; and seizure activity secondary to severe ETOH withdrawal requiring precedex gtt.  Required intubation due to progressive encephalopathy due to alcohol withdrawal.  Past Medical History:  Anxiety HTN  Significant Hospital Events:  02/28severe ETOH withdrawal, severe pancreatitis 3/1 severe pancreatitis 3/2 increased WOB and SOB, severe DT's, patient emergently intubated 3/2 PICC line placed 3/3 remains intubated, JP drain placed into 9cm fluid collection by IR 3/3 IR placed dubhoff tube, started trickle feeds 3/4 remains on vent, severe pancreatitis 3/5 OG suction c/w TF's, severe abd distention 3/6 continue vent dependence, anasarca, abdominal distention.  Start albumin, Reglan, restart feeds. 06/21/20- patient on SBT today. OGT feeds stopping. Extubated    Consults:  Orthopedics PCCM Cardiology Psychiatry General surgery Nephrology   Procedures:  IR drain of abdominal fluid collection   Significant Diagnostic Tests:  CT abdomen on 06/19/20 as below   IMPRESSION: 1. Multifactorial degradation, arm position, and lack of IV contrast. 2. Similar moderate pancreatitis. Resolution of previously described gastrocolic ligament fluid collection. 3. Decreased small volume abdominopelvic fluid. 4. Similar small left pleural effusion with bibasilar atelectasis.   Electronically Signed   By: Jeronimo Greaves M.D.   On: 06/19/2020 10:17  Micro Data:  Blood cultures thus  far negative.  Antimicrobials:  Day 7 zosyn  Interim History / Subjective:  Patient agitated and asynchronous overnight  Objective   Blood pressure 129/72, pulse 100, temperature 98.6 F (37 C), resp. rate 11, height 5\' 5"  (1.651 m), weight 103.2 kg, SpO2 98 %.    Vent Mode: PRVC FiO2 (%):  [30 %] 30 % Set Rate:  [15 bmp] 15 bmp Vt Set:  [500 mL] 500 mL PEEP:  [5 cmH20] 5 cmH20 Pressure Support:  [10 cmH20] 10 cmH20 Plateau Pressure:  [35 cmH20] 35 cmH20   Intake/Output Summary (Last 24 hours) at 06/21/2020 1007 Last data filed at 06/21/2020 0700 Gross per 24 hour  Intake 3266.21 ml  Output 2300 ml  Net 966.21 ml   Filed Weights   06/19/20 0449 06/20/20 0444 06/21/20 0250  Weight: 103.1 kg 103.1 kg 103.2 kg    Examination: General: Patient intermittently will respond to pain. Sedated on versed fentanyl and precedex HENT: ET tube in place Lungs: Mechanical breath sounds  Cardiovascular: RRR. There is swelling in lower extremities Abdomen: Distended but soft. Not rigid Extremities: Swelling  Neuro: moves all extremities spontaneously  GU: Foley place  Resolved Hospital Problem list     Assessment & Plan:  This is a 30 yo who presents with DT's, pancreatitis, seizures and  Dislocated shoulder    Acute pancreatitis secondary to ETOH abuse  Ileus OG to LIS Postpyloric Dobbhoff for feedings Continue Reglan 5 mg every 8h Wean dilaudid gtt and versed with PO alternatives   Acute respiratory failure with hypoxia-resolving. On minimal vent settings. Needs D/T ETOH withdrawal & severe pancreatitis - recruitment maneuvers initiated for compliance issues with abdominal distension- -Bronchodilators for mucociliary clearance -SAT/SBT when meets criteria         -plan for extubation today  if patient passess   Mildly elevated troponin's likely secondary to demand ischemia in the setting of acute pancreatitis: 93~214; Echo with hyperdynamic LVEF Hx: HTN  Continuous  telemetry monitoring  Cardiology consulted appreciate input-recommending no further workup at this time  deferred iv heparin in the setting of acute pancreatitis,  metoprolol to 7.5 mg iv q6hrs for heart rate and bp control  PRN labetalol - d/c'd     Leftglenohumeral joint dislocation with greater tuberosity fracture s/p left shoulder reduction in ER Orthopedic surgery consulted appreciate input Conservative nonsurgical management, arm sling LUE  Acute encephalopathy secondary to severe ETOH withdrawal  Seizure activity secondary to ETOH withdrwal  Hx: Anxiety, depression, and suicide ideation . Out of window for DT's Continue CIWA protocol, currently on infusions PRN ativan for seizure activity  Wean versed with PO alternative   Best practice (evaluated daily)  Diet: Feeds at 25/hr Pain/Anxiety/Delirium protocol (if indicated): Weaning to PO. But on fentanyl, Versed and precedex. VAP protocol (if indicated): HOB 30 degrees DVT prophylaxis: lovenox GI prophylaxis: protonix and being fed Glucose control: Well controlled FSBS Q4 Mobility: bed rest Disposition:ICU  Goals of Care:  Last date of multidisciplinary goals of care discussion:06/19/20 Family and staff present: Bedside nurse Code Status: Full  Labs   CBC: Recent Labs  Lab 06/17/20 0450 06/18/20 0400 06/19/20 0315 06/19/20 1233 06/20/20 0358 06/21/20 0400  WBC 11.0* 13.2* 12.2*  --  18.1* 18.9*  HGB 10.4* 9.5* 7.7* 9.8* 8.2* 8.2*  HCT 34.0* 31.2* 26.1* 32.4* 27.9* 27.9*  MCV 100.6* 98.4 102.0*  --  102.6* 102.2*  PLT 382 448* 368  --  499* 572*    Basic Metabolic Panel: Recent Labs  Lab 06/18/20 0400 06/19/20 0359 06/19/20 1233 06/20/20 0358 06/21/20 0400  NA 144 142 140 144 147*  K 3.2* 3.5 4.1 3.8 3.9  CL 111 108 107 110 111  CO2 25 26 26 26 29   GLUCOSE 166* 166* 115* 146* 134*  BUN 8 9 11 14  22*  CREATININE 1.73* 1.49* 1.72* 1.60* 1.50*  CALCIUM 8.3* 8.7* 8.8* 8.1* 8.9  MG 1.8 1.9 2.0  1.8 2.3  PHOS 4.6 4.4 5.2* 5.5* 4.8*   GFR: Estimated Creatinine Clearance: 81.1 mL/min (A) (by C-G formula based on SCr of 1.5 mg/dL (H)). Recent Labs  Lab 06/14/20 1441 06/15/20 0523 06/15/20 0523 06/16/20 0503 06/17/20 0450 06/18/20 0400 06/19/20 0315 06/20/20 0358 06/21/20 0400  PROCALCITON 0.63 0.89  --  0.45  --   --   --   --  0.40  WBC  --  9.5   < > 9.8   < > 13.2* 12.2* 18.1* 18.9*   < > = values in this interval not displayed.    Liver Function Tests: Recent Labs  Lab 06/15/20 0523 06/16/20 0503 06/17/20 0450 06/18/20 0400 06/19/20 1233  AST 73* 40 28 36  --   ALT 28 27 22 22   --   ALKPHOS 73 91 91 110  --   BILITOT 2.1* 1.6* 1.4* 1.4*  --   PROT 6.3* 6.0* 6.0* 5.9*  --   ALBUMIN 2.3* 2.0* 2.0* 1.9* 2.2*   Recent Labs  Lab 06/17/20 0450 06/18/20 0400 06/19/20 0359 06/20/20 0358 06/21/20 0400  LIPASE 39 38 44 53* 38  AMYLASE 71 64 67 59 48   No results for input(s): AMMONIA in the last 168 hours.  ABG    Component Value Date/Time   PHART 7.35 06/14/2020 1415   PCO2ART 43 06/14/2020 1415  PO2ART 98 06/14/2020 1415   HCO3 23.7 06/14/2020 1415   ACIDBASEDEF 2.0 06/14/2020 1415   O2SAT 97.3 06/14/2020 1415     Coagulation Profile: No results for input(s): INR, PROTIME in the last 168 hours.  Cardiac Enzymes: No results for input(s): CKTOTAL, CKMB, CKMBINDEX, TROPONINI in the last 168 hours.  HbA1C: No results found for: HGBA1C  CBG: Recent Labs  Lab 06/20/20 1621 06/20/20 1934 06/20/20 2309 06/21/20 0305 06/21/20 0719  GLUCAP 134* 133* 127* 126* 121*    Review of Systems:   Unable to attain as patient sedated and intubated  Past Medical History:  He,  has a past medical history of Anxiety and Hypertension.   Surgical History:  History reviewed. No pertinent surgical history.   Social History:   reports that he has been smoking cigarettes. He has been smoking about 0.50 packs per day. He has never used smokeless tobacco.  He reports current alcohol use of about 18.0 standard drinks of alcohol per week.   Family History:  His family history includes Hypertension in his mother.   Allergies Allergies  Allergen Reactions  . Peanut-Containing Drug Products Itching  . Shellfish Allergy      Home Medications  Prior to Admission medications   Medication Sig Start Date End Date Taking? Authorizing Provider  Aspirin-Acetaminophen-Caffeine (GOODY HEADACHE PO) Take 1 packet by mouth as needed.   Yes [provider]  ibuprofen (ADVIL) 200 MG tablet Take 200 mg by mouth every 6 (six) hours as needed for headache.   Yes [provider]  albuterol (VENTOLIN HFA) 108 (90 Base) MCG/ACT inhaler Inhale 2 puffs into the lungs every 6 (six) hours as needed for wheezing or shortness of breath. Patient not taking: No sig reported 12/02/18   Don Perking, [place], MD  ketorolac (TORADOL) 10 MG tablet Take 1 tablet (10 mg total) by mouth every 8 (eight) hours as needed. Patient not taking: No sig reported 08/10/15   Darci Current, MD  meloxicam (MOBIC) 15 MG tablet Take 1 tablet (15 mg total) by mouth daily. Patient not taking: No sig reported 09/05/19   Cuthriell, Christiane Ha D, PA-C  ondansetron (ZOFRAN ODT) 4 MG disintegrating tablet Take 1 tablet (4 mg total) by mouth every 8 (eight) hours as needed. Patient not taking: No sig reported 12/02/18   Nita Sickle, MD      Critical care provider statement:    Critical care time (minutes):  33   Critical care time was exclusive of:  Separately billable procedures and  treating other patients   Critical care was necessary to treat or prevent imminent or  life-threatening deterioration of the following conditions:  alocholism, abd fluid collection   Critical care was time spent personally by me on the following  activities:  Development of treatment plan with patient or surrogate,  discussions with consultants, evaluation of patient's response to  treatment,  examination of patient, obtaining history from patient or  surrogate, ordering and performing treatments and interventions, ordering  and review of laboratory studies and re-evaluation of patient's condition   I assumed direction of critical care for this patient from another  provider in my specialty: no

## 2020-06-21 NOTE — Progress Notes (Signed)
1250: Patient self extubated. RT was notified. Dr. Tim Lair notified.   Patient placed on nasal cannula and eventually BIPAP.   SBP 190's-200's. HR 150's. MD notified. Ordered Metoprolol 5mg  IVP. Med given and BP and HR still remains elevated. MD ordered for Labetalol 10 mg IV and restart the Precedex drip.   Mom and sister at bedside.

## 2020-06-22 DIAGNOSIS — L8990 Pressure ulcer of unspecified site, unspecified stage: Secondary | ICD-10-CM | POA: Insufficient documentation

## 2020-06-22 LAB — CBC
HCT: 29.2 % — ABNORMAL LOW (ref 39.0–52.0)
Hemoglobin: 9 g/dL — ABNORMAL LOW (ref 13.0–17.0)
MCH: 30 pg (ref 26.0–34.0)
MCHC: 30.8 g/dL (ref 30.0–36.0)
MCV: 97.3 fL (ref 80.0–100.0)
Platelets: 686 10*3/uL — ABNORMAL HIGH (ref 150–400)
RBC: 3 MIL/uL — ABNORMAL LOW (ref 4.22–5.81)
RDW: 13.3 % (ref 11.5–15.5)
WBC: 21 10*3/uL — ABNORMAL HIGH (ref 4.0–10.5)
nRBC: 0.1 % (ref 0.0–0.2)

## 2020-06-22 LAB — AMYLASE: Amylase: 43 U/L (ref 28–100)

## 2020-06-22 LAB — GLUCOSE, CAPILLARY
Glucose-Capillary: 117 mg/dL — ABNORMAL HIGH (ref 70–99)
Glucose-Capillary: 84 mg/dL (ref 70–99)
Glucose-Capillary: 118 mg/dL — ABNORMAL HIGH (ref 70–99)
Glucose-Capillary: 102 mg/dL — ABNORMAL HIGH (ref 70–99)
Glucose-Capillary: 102 mg/dL — ABNORMAL HIGH (ref 70–99)
Glucose-Capillary: 112 mg/dL — ABNORMAL HIGH (ref 70–99)

## 2020-06-22 LAB — BASIC METABOLIC PANEL
Anion gap: 12 (ref 5–15)
BUN: 19 mg/dL (ref 6–20)
CO2: 29 mmol/L (ref 22–32)
Calcium: 8.9 mg/dL (ref 8.9–10.3)
Chloride: 107 mmol/L (ref 98–111)
Creatinine, Ser: 1.31 mg/dL — ABNORMAL HIGH (ref 0.61–1.24)
GFR, Estimated: >60 mL/min (ref >60)
Glucose, Bld: 106 mg/dL — ABNORMAL HIGH (ref 70–99)
Potassium: 3.3 mmol/L — ABNORMAL LOW (ref 3.5–5.1)
Sodium: 148 mmol/L — ABNORMAL HIGH (ref 135–145)

## 2020-06-22 LAB — PROCALCITONIN: Procalcitonin: 0.43 ng/mL

## 2020-06-22 LAB — MAGNESIUM: Magnesium: 2.3 mg/dL (ref 1.7–2.4)

## 2020-06-22 LAB — PHOSPHORUS: Phosphorus: 3.9 mg/dL (ref 2.5–4.6)

## 2020-06-22 LAB — LIPASE, BLOOD: Lipase: 39 U/L (ref 11–51)

## 2020-06-22 MED ORDER — FREE WATER: 400.0000 mL | Status: DC

## 2020-06-22 MED ORDER — PANTOPRAZOLE SODIUM 40 MG PO PACK
40.0000 mg | PACK | Freq: Every day | ORAL | Status: DC
  Administered 2020-06-23: 40 mg
  Filled 2020-06-22: qty 20

## 2020-06-22 MED ORDER — PROSOURCE TF PO LIQD
45.0000 mL | Freq: Two times a day (BID) | ORAL | Status: DC
  Filled 2020-06-22 (×3): qty 45

## 2020-06-22 MED ORDER — HYDROXYZINE HCL 25 MG PO TABS
25.0000 mg | ORAL_TABLET | Freq: Four times a day (QID) | ORAL | Status: DC | PRN
Start: 2020-06-22 — End: 2020-06-24
  Administered 2020-06-22 (×2): 25 mg via ORAL
  Filled 2020-06-22 (×3): qty 1

## 2020-06-22 MED ORDER — POTASSIUM CHLORIDE 10 MEQ/50ML IV SOLN
10.0000 meq | INTRAVENOUS | Status: AC
  Administered 2020-06-22 (×6): 10 meq via INTRAVENOUS
  Filled 2020-06-22 (×6): qty 50

## 2020-06-22 MED ORDER — VITAL 1.5 CAL PO LIQD
1000.0000 mL | ORAL | Status: DC
  Administered 2020-06-22: 1000 mL

## 2020-06-22 MED ORDER — CHLORDIAZEPOXIDE HCL 25 MG PO CAPS
25.0000 mg | ORAL_CAPSULE | Freq: Four times a day (QID) | ORAL | Status: DC | PRN
Start: 2020-06-22 — End: 2020-06-24
  Administered 2020-06-22: 25 mg via ORAL
  Filled 2020-06-22: qty 1

## 2020-06-22 MED ORDER — FREE WATER
30.0000 mL | Status: DC
  Administered 2020-06-22 (×2): 30 mL

## 2020-06-22 MED ORDER — DEXTROSE 5 % IV SOLN: INTRAVENOUS | Status: DC

## 2020-06-22 MED ORDER — HYDRALAZINE HCL 20 MG/ML IJ SOLN
10.0000 mg | INTRAMUSCULAR | Status: DC | PRN
  Administered 2020-06-22 – 2020-06-23 (×2): 20 mg via INTRAVENOUS
  Filled 2020-06-22 (×2): qty 1

## 2020-06-22 NOTE — Progress Notes (Signed)
NAME:  Marcus Small, MRN:  [id number], DOB:  10-28-1990, LOS: 10 ADMISSION DATE:  06/11/2020, CONSULTATION DATE:  06/20/20 REFERRING MD:  Mikki Harbor, CHIEF COMPLAINT:  Etoh abuse  Brief History:  This is a 30 yo male who presents with etoh withdrawal with glenohumeral joint dislocation. Also complicated by pancreatitis  History of Present Illness:  Marcus Small is an 30 y.o. male admitted withleft glenohumeral joint fracture/dislocation with greater pt s/p left shoulder reduction in ER; pancreatitis; and seizure activity secondary to severe ETOH withdrawal requiring precedex gtt.  Required intubation due to progressive encephalopathy due to alcohol withdrawal.  Past Medical History:  Anxiety HTN  Significant Hospital Events:  02/28severe ETOH withdrawal, severe pancreatitis 3/1 severe pancreatitis 3/2 increased WOB and SOB, severe DT's, patient emergently intubated 3/2 PICC line placed 3/3 remains intubated, JP drain placed into 9cm fluid collection by IR 3/3 IR placed dubhoff tube, started trickle feeds 3/4 remains on vent, severe pancreatitis 3/5 OG suction c/w TF's, severe abd distention 3/6 continue vent dependence, anasarca, abdominal distention.  Start albumin, Reglan, restart feeds. 06/21/20- patient on SBT today. OGT feeds stopping. Extubated   06/22/20- patient had no overnight events, plan today to optimize medically for [facility] transfer  Consults:  Orthopedics PCCM Cardiology Psychiatry General surgery Nephrology   Procedures:  IR drain of abdominal fluid collection   Significant Diagnostic Tests:  CT abdomen on 06/19/20 as below   IMPRESSION: 1. Multifactorial degradation, arm position, and lack of IV contrast. 2. Similar moderate pancreatitis. Resolution of previously described gastrocolic ligament fluid collection. 3. Decreased small volume abdominopelvic fluid. 4. Similar small left pleural effusion with bibasilar atelectasis.   Electronically  Signed   By: Jeronimo Greaves M.D.   On: 06/19/2020 10:17  Micro Data:  Blood cultures thus far negative.  Antimicrobials:  Day 7 zosyn  Interim History / Subjective:  Patient agitated and asynchronous overnight  Objective   Blood pressure (!) 193/106, pulse 93, temperature (!) 100.7 F (38.2 C), temperature source Axillary, resp. rate (!) 29, height 5\' 5"  (1.651 m), weight 103.4 kg, SpO2 100 %.    Vent Mode: PCV FiO2 (%):  [30 %-36 %] 32 % PEEP:  [5 cmH20] 5 cmH20 Pressure Support:  [5 cmH20] 5 cmH20   Intake/Output Summary (Last 24 hours) at 06/22/2020 0842 Last data filed at 06/22/2020 0811 Gross per 24 hour  Intake 1792.78 ml  Output 5655 ml  Net -3862.22 ml   Filed Weights   06/20/20 0444 06/21/20 0250 06/22/20 0301  Weight: 103.1 kg 103.2 kg 103.4 kg    Examination: General: Patient intermittently will respond to pain. Sedated on versed fentanyl and precedex HENT: ET tube in place Lungs: Mechanical breath sounds  Cardiovascular: RRR. There is swelling in lower extremities Abdomen: Distended but soft. Not rigid Extremities: Swelling  Neuro: moves all extremities spontaneously  GU: Foley place  Resolved Hospital Problem list     Assessment & Plan:  This is a 30 yo who presents with DT's, pancreatitis, seizures and  Dislocated shoulder    Severe Acute pancreatitis  secondary to ETOH abuse  Postpyloric Dobbhoff for feedings Continue Reglan 5 mg every 8h Wean dilaudid gtt and versed with PO alternatives             -Fluid collection with drain and culture is negative to date            -Infectious disease consultation - appreciate input-zosyn dcd merem started.  Acute respiratory failure with hypoxia     Due to moderate ARDS secondary to pancreatitis                  S/p aggressive IVF- net +10L         - patient is being diuresed- 3l negative over 24h               NSTEMI type 2 Continuous telemetry monitoring  - due to  cardiac stress with demand ischemia secondary to severe acute pancreatitis     Leftglenohumeral joint dislocation        -  with greater tuberosity fracture s/p left shoulder reduction in ER Orthopedic surgery consulted appreciate input Conservative nonsurgical management, arm sling LUE    Acute encephalopathy secondary to severe ETOH withdrawal  Seizure activity secondary to ETOH withdrwal  Hx: Anxiety, depression, and suicide ideation . Out of window for DT's Continue CIWA protocol, currently on infusions PRN ativan for seizure activity  Wean versed with PO alternative   Best practice (evaluated daily)  Diet: Feeds at 25/hr Pain/Anxiety/Delirium protocol (if indicated): Weaning to PO. But on fentanyl, Versed and precedex. VAP protocol (if indicated): HOB 30 degrees DVT prophylaxis: lovenox GI prophylaxis: protonix and being fed Glucose control: Well controlled FSBS Q4 Mobility: bed rest Disposition:ICU  Goals of Care:  Last date of multidisciplinary goals of care discussion:06/19/20 Family and staff present: Bedside nurse Code Status: Full  Labs   CBC: Recent Labs  Lab 06/18/20 0400 06/19/20 0315 06/19/20 1233 06/20/20 0358 06/21/20 0400 06/22/20 0400  WBC 13.2* 12.2*  --  18.1* 18.9* 21.0*  HGB 9.5* 7.7* 9.8* 8.2* 8.2* 9.0*  HCT 31.2* 26.1* 32.4* 27.9* 27.9* 29.2*  MCV 98.4 102.0*  --  102.6* 102.2* 97.3  PLT 448* 368  --  499* 572* 686*    Basic Metabolic Panel: Recent Labs  Lab 06/19/20 0359 06/19/20 1233 06/20/20 0358 06/21/20 0400 06/22/20 0400  NA 142 140 144 147* 148*  K 3.5 4.1 3.8 3.9 3.3*  CL 108 107 110 111 107  CO2 26 26 26 29 29   GLUCOSE 166* 115* 146* 134* 106*  BUN 9 11 14  22* 19  CREATININE 1.49* 1.72* 1.60* 1.50* 1.31*  CALCIUM 8.7* 8.8* 8.1* 8.9 8.9  MG 1.9 2.0 1.8 2.3 2.3  PHOS 4.4 5.2* 5.5* 4.8* 3.9   GFR: Estimated Creatinine Clearance: 93 mL/min (A) (by C-G formula based on SCr of 1.31 mg/dL (H)). Recent Labs  Lab  06/16/20 0503 06/17/20 0450 06/19/20 0315 06/20/20 0358 06/21/20 0400 06/22/20 0400  PROCALCITON 0.45  --   --   --  0.40 0.43  WBC 9.8   < > 12.2* 18.1* 18.9* 21.0*   < > = values in this interval not displayed.    Liver Function Tests: Recent Labs  Lab 06/16/20 0503 06/17/20 0450 06/18/20 0400 06/19/20 1233  AST 40 28 36  --   ALT 27 22 22   --   ALKPHOS 91 91 110  --   BILITOT 1.6* 1.4* 1.4*  --   PROT 6.0* 6.0* 5.9*  --   ALBUMIN 2.0* 2.0* 1.9* 2.2*   Recent Labs  Lab 06/18/20 0400 06/19/20 0359 06/20/20 0358 06/21/20 0400 06/22/20 0400  LIPASE 38 44 53* 38 39  AMYLASE 64 67 59 48 43   No results for input(s): AMMONIA in the last 168 hours.  ABG    Component Value Date/Time   PHART 7.35 06/14/2020 1415   PCO2ART 43 06/14/2020  1415   PO2ART 98 06/14/2020 1415   HCO3 23.7 06/14/2020 1415   ACIDBASEDEF 2.0 06/14/2020 1415   O2SAT 97.3 06/14/2020 1415     Coagulation Profile: No results for input(s): INR, PROTIME in the last 168 hours.  Cardiac Enzymes: No results for input(s): CKTOTAL, CKMB, CKMBINDEX, TROPONINI in the last 168 hours.  HbA1C: No results found for: HGBA1C  CBG: Recent Labs  Lab 06/21/20 1501 06/21/20 1911 06/21/20 2303 06/22/20 0309 06/22/20 0714  GLUCAP 121* 120* 116* 117* 84    Review of Systems:   Unable to attain as patient sedated and intubated  Past Medical History:  He,  has a past medical history of Anxiety and Hypertension.   Surgical History:  History reviewed. No pertinent surgical history.   Social History:   reports that he has been smoking cigarettes. He has been smoking about 0.50 packs per day. He has never used smokeless tobacco. He reports current alcohol use of about 18.0 standard drinks of alcohol per week.   Family History:  His family history includes Hypertension in his mother.   Allergies Allergies  Allergen Reactions  . Peanut-Containing Drug Products Itching  . Shellfish Allergy       Home Medications  Prior to Admission medications   Medication Sig Start Date End Date Taking? Authorizing Provider  Aspirin-Acetaminophen-Caffeine (GOODY HEADACHE PO) Take 1 packet by mouth as needed.   Yes [provider]  ibuprofen (ADVIL) 200 MG tablet Take 200 mg by mouth every 6 (six) hours as needed for headache.   Yes [provider]  albuterol (VENTOLIN HFA) 108 (90 Base) MCG/ACT inhaler Inhale 2 puffs into the lungs every 6 (six) hours as needed for wheezing or shortness of breath. Patient not taking: No sig reported 12/02/18   Don Perking, [place], MD  ketorolac (TORADOL) 10 MG tablet Take 1 tablet (10 mg total) by mouth every 8 (eight) hours as needed. Patient not taking: No sig reported 08/10/15   Darci Current, MD  meloxicam (MOBIC) 15 MG tablet Take 1 tablet (15 mg total) by mouth daily. Patient not taking: No sig reported 09/05/19   Cuthriell, Christiane Ha D, PA-C  ondansetron (ZOFRAN ODT) 4 MG disintegrating tablet Take 1 tablet (4 mg total) by mouth every 8 (eight) hours as needed. Patient not taking: No sig reported 12/02/18   Nita Sickle, MD      Critical care provider statement:    Critical care time (minutes):  33   Critical care time was exclusive of:  Separately billable procedures and  treating other patients   Critical care was necessary to treat or prevent imminent or  life-threatening deterioration of the following conditions:  alocholism, abd fluid collection   Critical care was time spent personally by me on the following  activities:  Development of treatment plan with patient or surrogate,  discussions with consultants, evaluation of patient's response to  treatment, examination of patient, obtaining history from patient or  surrogate, ordering and performing treatments and interventions, ordering  and review of laboratory studies and re-evaluation of patient's condition   I assumed direction of critical care for this patient from  another  provider in my specialty: no

## 2020-06-22 NOTE — Progress Notes (Signed)
Date of Admission:  06/11/2020      ID: Marcus Small is a 30 y.o. male  Principal Problem:   Delirium tremens ([place]) Active Problems:   Alcohol withdrawal seizure ([place])   HTN (hypertension)   Alcohol withdrawal ([place])   Alcohol use   Alcoholic pancreatitis   Leukocytosis   AKI (acute kidney injury) ([place])   Hypokalemia   High anion gap metabolic acidosis   Closed dislocation of left shoulder   Elevated troponin   Tachycardia   Acute pancreatitis    Subjective: Doing better More awake and alert Conversant Off BiPAP Was able to drink some fluids  Medications:  . chlorhexidine gluconate (MEDLINE KIT)  15 mL Mouth Rinse BID  . Chlorhexidine Gluconate Cloth  6 each Topical Daily  . clonazePAM  0.5 mg Oral BID  . cloNIDine  0.3 mg Transdermal Weekly  . docusate  100 mg Per Tube BID  . enoxaparin (LOVENOX) injection  0.5 mg/kg Subcutaneous Q24H  . feeding supplement (PROSource TF)  45 mL Per Tube BID  . folic acid  1 mg Intravenous Daily  . free water  30 mL Per Tube Q4H  . furosemide  40 mg Intravenous Daily  . ipratropium-albuterol  3 mL Nebulization Q6H  . mouth rinse  15 mL Mouth Rinse 10 times per day  . metoCLOPramide (REGLAN) injection  5 mg Intravenous Q8H  . metoprolol tartrate  25 mg Per Tube BID  . multivitamin with minerals  1 tablet Oral Daily  . [START ON 06/23/2020] pantoprazole sodium  40 mg Per Tube Daily  . sodium chloride flush  10-40 mL Intracatheter Q12H  . sodium chloride flush  3 mL Intravenous Q12H  . sodium chloride flush  5 mL Intracatheter Q8H  . thiamine  100 mg Oral Daily   Or  . thiamine  100 mg Intravenous Daily    Objective: Vital signs in last 24 hours:  Patient Vitals for the past 24 hrs:  BP Temp Temp src Pulse Resp SpO2 Weight  06/22/20 1900 (!) 172/104 -- -- (!) 114 (!) 22 98 % --  06/22/20 1830 (!) 188/123 -- -- (!) 119 19 98 % --  06/22/20 1800 (!) 198/131 -- -- (!) 125 (!) 22 97 % --  06/22/20 1730 (!) 170/113 -- --  (!) 111 17 99 % --  06/22/20 1544 -- 99.8 F (37.7 C) Axillary -- -- -- --  06/22/20 1530 (!) 148/96 -- -- (!) 102 (!) 28 97 % --  06/22/20 1500 (!) 170/95 -- -- 98 (!) 32 99 % --  06/22/20 1430 (!) 172/94 -- -- 95 (!) 35 96 % --  06/22/20 1400 (!) 157/90 -- -- 97 (!) 28 97 % --  06/22/20 1300 (!) 164/103 -- -- (!) 103 (!) 24 95 % --  06/22/20 1230 (!) 155/100 -- -- 92 (!) 21 98 % --  06/22/20 1200 (!) 167/97 100 F (37.8 C) Oral 94 (!) 27 99 % --  06/22/20 1130 (!) 157/93 -- -- 96 (!) 26 98 % --  06/22/20 1100 (!) 159/91 -- -- 96 (!) 26 98 % --  06/22/20 1030 (!) 149/92 -- -- 95 (!) 26 99 % --  06/22/20 1000 (!) 148/85 -- -- 97 (!) 27 97 % --  06/22/20 0930 (!) 159/96 -- -- 97 (!) 22 98 % --  06/22/20 0900 (!) 164/91 -- -- 98 (!) 26 97 % --  06/22/20 0830 (!) 159/101 -- -- Marland Kitchen 104 Marland Kitchen)  29 98 % --  06/22/20 0800 (!) 193/106 (!) 100.7 F (38.2 C) Axillary 93 (!) 29 100 % --  06/22/20 0700 (!) 186/110 -- -- 95 (!) 28 97 % --  06/22/20 0600 (!) 186/88 99.5 F (37.5 C) Oral 96 (!) 32 99 % --  06/22/20 0400 (!) 178/103 99.7 F (37.6 C) Oral (!) 103 (!) 28 97 % --  06/22/20 0301 -- -- -- -- -- -- 103.4 kg  06/22/20 0200 (!) 166/93 -- -- (!) 106 (!) 26 97 % --  06/22/20 0136 -- -- -- -- -- 97 % --  06/22/20 0100 (!) 160/87 -- -- (!) 105 (!) 26 98 % --  06/22/20 0000 (!) 144/86 100 F (37.8 C) Oral (!) 106 (!) 29 96 % --  06/21/20 2300 (!) 152/88 (!) 102.1 F (38.9 C) Oral (!) 117 (!) 31 100 % --  06/21/20 2200 (!) 161/90 -- -- (!) 118 (!) 22 98 % --  06/21/20 2106 140/89 -- -- (!) 119 -- -- --  06/21/20 2100 140/89 -- -- (!) 117 (!) 29 98 % --  06/21/20 2000 -- -- -- (!) 117 (!) 26 98 % --  PHYSICAL EXAM:  General: Awake more alert No distress Lips, mucosa, and tongue normal. No Thrush Neck: Supple, symmetrical, no adenopathy, thyroid: non tender no carotid bruit and no JVD. Lungs: Bilateral air entry.  Crypts in the bases Heart: S1-S2.  Tachycardia Abdomen:  distended. Bowel  sounds normal. No masses Extremities: Edematous Skin: No rashes or lesions. Or bruising Lymph: Cervical, supraclavicular normal. Neurologic: Grossly non-focal  Lab Results Recent Labs    06/21/20 0400 06/22/20 0400  WBC 18.9* 21.0*  HGB 8.2* 9.0*  HCT 27.9* 29.2*  NA 147* 148*  K 3.9 3.3*  CL 111 107  CO2 29 29  BUN 22* 19  CREATININE 1.50* 1.31*   Liver Panel No results for input(s): PROT, ALBUMIN, AST, ALT, ALKPHOS, BILITOT, BILIDIR, IBILI in the last 72 hours. Sedimentation Rate No results for input(s): ESRSEDRATE in the last 72 hours. C-Reactive Protein No results for input(s): CRP in the last 72 hours.  Microbiology:  Studies/Results: DG Abd 1 View  Result Date: 06/21/2020 CLINICAL DATA:  Ileus. EXAM: ABDOMEN - 1 VIEW COMPARISON:  CT 06/19/2020.   06/18/2020. FINDINGS: Feeding tube in unchanged position. Left upper quadrant drainage catheter in stable position. Mild colonic prominence. No small bowel distention. No free air. IMPRESSION: 1. Feeding tube in unchanged position. Left upper quadrant drainage catheter stable position. 2.  Mild colonic prominence. No small bowel distention. No free air. Electronically Signed   By: Marcello Moores  Register   On: 06/21/2020 08:37   DG Chest Port 1 View  Result Date: 06/21/2020 CLINICAL DATA:  Difficulty with extubation EXAM: PORTABLE CHEST 1 VIEW COMPARISON:  June 18, 2020 FINDINGS: Endotracheal tube is been removed. Enteric tube tip is below the diaphragm. Central catheter tip is in the superior vena cava. No pneumothorax. The lungs are clear. The heart size and pulmonary vascularity are normal. No adenopathy. No bone lesions. IMPRESSION: Tube and catheter positions as described without evident pneumothorax. Lungs clear. Cardiac silhouette within normal limits. Electronically Signed   By: Lowella Grip III M.D.   On: 06/21/2020 13:37     Assessment/Plan:  Fever improving.  Likely due to pancreatitis  Leukocytosis around  21 Currently on meropenem  Acute hypoxic respiratory failure was intubated S/P self extubation before the planned.  Doing well  Acute pancreatitis  AKI improving  Anemia received blood transfusion  Seizures due to alcohol withdrawal on presentation..  Discussed the management with the care team.

## 2020-06-22 NOTE — Consult Note (Addendum)
PHARMACY CONSULT NOTE - FOLLOW UP  Pharmacy Consult for Electrolyte Monitoring and Replacement   Recent Labs: Potassium (mmol/L)  Date Value  06/22/2020 3.3 (L)  08/18/2012 4.3   Magnesium (mg/dL)  Date Value  32/99/2426 2.3   Calcium (mg/dL)  Date Value  83/41/9622 8.9   Calcium, Total (mg/dL)  Date Value  29/79/8921 9.2   Albumin (g/dL)  Date Value  19/41/7408 2.2 (L)  08/18/2012 4.2   Phosphorus (mg/dL)  Date Value  14/48/1856 3.9   Sodium (mmol/L)  Date Value  06/22/2020 148 (H)  08/18/2012 139    Assessment: 30 yo M admitted withleft glenohumeral jointfracture/dislocation with greater pt s/p left shoulder reduction in ER, pancreatitis, and seizure activity secondary to severe ETOH withdrawal. Pt with gastrocolic ligament abscess s/p drainage on 3/3 and with biliary drain. Pt self extubated 3/9. Pt on furosemide 40 mg IV daily, tube feeds, and feeding supplement 4x daily.  Goal of Therapy:  Electrolytes WNL  Plan:  --Potassium 10 mEq x 6 doses IV ordered by NP --Free water for hypernatremia discontinued by MD, D5 @ 100 mL/hr added --All other electrolytes WNL, no electrolyte replacement at this time --Follow-up electrolytes with AM labs tomorrow  Reatha Armour, PharmD Pharmacy Resident  06/22/2020 7:08 AM

## 2020-06-22 NOTE — Progress Notes (Signed)
PT Cancellation Note  Patient Details Name: Marcus Small MRN: [id number] DOB: 1990/10/13   Cancelled Treatment:    Reason Eval/Treat Not Completed: Fatigue/lethargy limiting ability to participate;Patient not medically ready (Per RN, and pt's mother, pt has been sedated most of day. Will wake for brief moments when engaged, mumbles, difficult to understand, quickly back asleep.) Per RN precedex drip being DC today and pt to be monitored for any adverse response. Will attempt again at later date/time.    Anays Detore C 06/22/2020, 3:06 PM

## 2020-06-22 NOTE — Progress Notes (Signed)
[facility]  ROUNDING NOTE   Subjective:   Extubated. Girlfriend at bedside.   UOP 5523m Not getting free water or tube feeds  Na 148   Objective:  Vital signs in last 24 hours:  Temp:  [98.8 F (37.1 C)-102.8 F (39.3 C)] 100.7 F (38.2 C) (03/10 0800) Pulse Rate:  [93-158] 98 (03/10 0900) Resp:  [12-32] 26 (03/10 0900) BP: (140-226)/(64-140) 164/91 (03/10 0900) SpO2:  [96 %-100 %] 97 % (03/10 0900) FiO2 (%):  [30 %-36 %] 32 % (03/10 0600) Weight:  [103.4 kg] 103.4 kg (03/10 0301)  Weight change: 0.2 kg Filed Weights   06/20/20 0444 06/21/20 0250 06/22/20 0301  Weight: 103.1 kg 103.2 kg 103.4 kg    Intake/Output: I/O last 3 completed shifts: In: 3424.4 [I.V.:1262.3; Other:20; NG/GT:1668.8; IV Piggyback:473.3] Out: 72010[[OFHQR:9758 Emesis/NG output:575; Drains:20]   Intake/Output this shift:  Total I/O In: 97.9 [I.V.:89.8; IV Piggyback:8.1] Out: 1760 [Urine:1760]  Physical Exam: General:  ill  Head: Dry mucosal membranes, NGT  Eyes: PERRL  Neck: trachea midline  Lungs:  clear  Heart: Regular rate and rhythm  Abdomen:  Soft  Extremities:  no peripheral edema.  Neurologic: Answering questions with head nods  Skin: No lesions  GU: Foley with urine    Basic Metabolic Panel: Recent Labs  Lab 06/19/20 0359 06/19/20 1233 06/20/20 0358 06/21/20 0400 06/22/20 0400  NA 142 140 144 147* 148*  K 3.5 4.1 3.8 3.9 3.3*  CL 108 107 110 111 107  CO2 26 26 26 29 29   GLUCOSE 166* 115* 146* 134* 106*  BUN 9 11 14  22* 19  CREATININE 1.49* 1.72* 1.60* 1.50* 1.31*  CALCIUM 8.7* 8.8* 8.1* 8.9 8.9  MG 1.9 2.0 1.8 2.3 2.3  PHOS 4.4 5.2* 5.5* 4.8* 3.9    Liver Function Tests: Recent Labs  Lab 06/16/20 0503 06/17/20 0450 06/18/20 0400 06/19/20 1233  AST 40 28 36  --   ALT 27 22 22   --   ALKPHOS 91 91 110  --   BILITOT 1.6* 1.4* 1.4*  --   PROT 6.0* 6.0* 5.9*  --   ALBUMIN 2.0* 2.0* 1.9* 2.2*   Recent Labs  Lab 06/18/20 0400 06/19/20 0359  06/20/20 0358 06/21/20 0400 06/22/20 0400  LIPASE 38 44 53* 38 39  AMYLASE 64 67 59 48 43   No results for input(s): AMMONIA in the last 168 hours.  CBC: Recent Labs  Lab 06/18/20 0400 06/19/20 0315 06/19/20 1233 06/20/20 0358 06/21/20 0400 06/22/20 0400  WBC 13.2* 12.2*  --  18.1* 18.9* 21.0*  HGB 9.5* 7.7* 9.8* 8.2* 8.2* 9.0*  HCT 31.2* 26.1* 32.4* 27.9* 27.9* 29.2*  MCV 98.4 102.0*  --  102.6* 102.2* 97.3  PLT 448* 368  --  499* 572* 686*    Cardiac Enzymes: No results for input(s): CKTOTAL, CKMB, CKMBINDEX, TROPONINI in the last 168 hours.  BNP: Invalid input(s): POCBNP  CBG: Recent Labs  Lab 06/21/20 1501 06/21/20 1911 06/21/20 2303 06/22/20 0309 06/22/20 0714  GLUCAP 121* 120* 116* 117* 835   Microbiology: Results for orders placed or performed during the hospital encounter of 06/11/20  Resp Panel by RT-PCR (Flu A&B, Covid) Nasopharyngeal Swab     Status: None   Collection Time: 06/11/20  9:32 PM   Specimen: Nasopharyngeal Swab; Nasopharyngeal(NP) swabs in vial transport medium  Result Value Ref Range Status   SARS Coronavirus 2 by RT PCR NEGATIVE NEGATIVE Final    Comment: (NOTE) SARS-CoV-2 target nucleic acids  are NOT DETECTED.  The SARS-CoV-2 RNA is generally detectable in upper respiratory specimens during the acute phase of infection. The lowest concentration of SARS-CoV-2 viral copies this assay can detect is 138 copies/mL. A negative result does not preclude SARS-Cov-2 infection and should not be used as the sole basis for treatment or other patient management decisions. A negative result may occur with  improper specimen collection/handling, submission of specimen other than nasopharyngeal swab, presence of viral mutation(s) within the areas targeted by this assay, and inadequate number of viral copies(<138 copies/mL). A negative result must be combined with clinical observations, patient history, and epidemiological information. The  expected result is Negative.  Fact Sheet for Patients:  EntrepreneurPulse.com.au  Fact Sheet for Healthcare Providers:  IncredibleEmployment.be  This test is no t yet approved or cleared by the [place] FDA and  has been authorized for detection and/or diagnosis of SARS-CoV-2 by FDA under an Emergency Use Authorization (EUA). This EUA will remain  in effect (meaning this test can be used) for the duration of the COVID-19 declaration under Section 564(b)(1) of the Act, 21 U.S.C.section 360bbb-3(b)(1), unless the authorization is terminated  or revoked sooner.       Influenza A by PCR NEGATIVE NEGATIVE Final   Influenza B by PCR NEGATIVE NEGATIVE Final    Comment: (NOTE) The Xpert Xpress SARS-CoV-2/FLU/RSV plus assay is intended as an aid in the diagnosis of influenza from Nasopharyngeal swab specimens and should not be used as a sole basis for treatment. Nasal washings and aspirates are unacceptable for Xpert Xpress SARS-CoV-2/FLU/RSV testing.  Fact Sheet for Patients: EntrepreneurPulse.com.au  Fact Sheet for Healthcare Providers: IncredibleEmployment.be  This test is not yet approved or cleared by the [place] FDA and has been authorized for detection and/or diagnosis of SARS-CoV-2 by FDA under an Emergency Use Authorization (EUA). This EUA will remain in effect (meaning this test can be used) for the duration of the COVID-19 declaration under Section 564(b)(1) of the Act, 21 U.S.C. section 360bbb-3(b)(1), unless the authorization is terminated or revoked.  Performed at [facility], [address] [place], [place] [address]   Blood culture (routine x 2)     Status: None   Collection Time: 06/12/20 12:17 AM   Specimen: BLOOD  Result Value Ref Range Status   Specimen Description BLOOD RIGHT FA  Final   Special Requests   Final    BOTTLES DRAWN AEROBIC AND ANAEROBIC Blood Culture  results may not be optimal due to an inadequate volume of blood received in culture bottles   Culture   Final    NO GROWTH 5 DAYS Performed at [facility], [address] [place], [place] [address]    Report Status 06/17/2020 FINAL  Final  Blood culture (routine x 2)     Status: None   Collection Time: 06/12/20 12:45 AM   Specimen: BLOOD  Result Value Ref Range Status   Specimen Description BLOOD RIGHT HAND  Final   Special Requests   Final    BOTTLES DRAWN AEROBIC AND ANAEROBIC Blood Culture results may not be optimal due to an inadequate volume of blood received in culture bottles   Culture   Final    NO GROWTH 5 DAYS Performed at [facility], [address], [place], [place] [address]    Report Status 06/17/2020 FINAL  Final  MRSA PCR Screening     Status: None   Collection Time: 06/12/20  3:27 AM   Specimen: Nasopharyngeal  Result Value  Ref Range Status   MRSA by PCR NEGATIVE NEGATIVE Final    Comment:        The GeneXpert MRSA Assay (FDA approved for NASAL specimens only), is one component of a comprehensive MRSA colonization surveillance program. It is not intended to diagnose MRSA infection nor to guide or monitor treatment for MRSA infections. Performed at [facility], [place], [place] [address]   Respiratory (~20 pathogens) panel by PCR     Status: None   Collection Time: 06/14/20  5:30 PM   Specimen: Nasopharyngeal Swab; Respiratory  Result Value Ref Range Status   Adenovirus NOT DETECTED NOT DETECTED Final   Coronavirus 229E NOT DETECTED NOT DETECTED Final    Comment: (NOTE) The Coronavirus on the Respiratory Panel, DOES NOT test for the novel  Coronavirus (2019 nCoV)    Coronavirus HKU1 NOT DETECTED NOT DETECTED Final   Coronavirus NL63 NOT DETECTED NOT DETECTED Final   Coronavirus OC43 NOT DETECTED NOT DETECTED Final   Metapneumovirus NOT DETECTED NOT DETECTED Final   Rhinovirus / Enterovirus NOT DETECTED  NOT DETECTED Final   Influenza A NOT DETECTED NOT DETECTED Final   Influenza B NOT DETECTED NOT DETECTED Final   Parainfluenza Virus 1 NOT DETECTED NOT DETECTED Final   Parainfluenza Virus 2 NOT DETECTED NOT DETECTED Final   Parainfluenza Virus 3 NOT DETECTED NOT DETECTED Final   Parainfluenza Virus 4 NOT DETECTED NOT DETECTED Final   Respiratory Syncytial Virus NOT DETECTED NOT DETECTED Final   Bordetella pertussis NOT DETECTED NOT DETECTED Final   Bordetella Parapertussis NOT DETECTED NOT DETECTED Final   Chlamydophila pneumoniae NOT DETECTED NOT DETECTED Final   Mycoplasma pneumoniae NOT DETECTED NOT DETECTED Final    Comment: Performed at [facility], [place], [place], [place] [address]  Culture, Respiratory w Gram Stain     Status: None   Collection Time: 06/14/20  5:46 PM   Specimen: Tracheal Aspirate; Respiratory  Result Value Ref Range Status   Specimen Description   Final    TRACHEAL ASPIRATE Performed at [facility], [address], [place], [place] [address]    Special Requests   Final    NONE Performed at [facility], [address] [place], [place] [address]    Gram Stain   Final    MODERATE WBC PRESENT,BOTH PMN AND MONONUCLEAR MODERATE GRAM NEGATIVE RODS FEW GRAM POSITIVE COCCI IN PAIRS IN CLUSTERS    Culture   Final    FEW Normal respiratory flora-no Staph aureus or Pseudomonas seen Performed at [facility], [address], [place], [place] [address]    Report Status 06/17/2020 FINAL  Final  Aerobic/Anaerobic Culture (surgical/deep wound)     Status: None   Collection Time: 06/15/20 12:29 PM   Specimen: Abscess  Result Value Ref Range Status   Specimen Description   Final    ABSCESS Performed at [facility], [address], [place], [place] [address]    Special Requests   Final    NONE Performed at [facility], [address] [place], [place] [address]    Gram Stain NO ORGANISMS SEEN   Final   Culture   Final    No growth aerobically or anaerobically. Performed at [facility], [place] [address] [place], [place] [address]    Report Status 06/20/2020 FINAL  Final  CULTURE, BLOOD (ROUTINE X 2) w Reflex to ID Panel     Status: None (  Preliminary result)   Collection Time: 06/18/20  7:47 PM   Specimen: BLOOD  Result Value Ref Range Status   Specimen Description BLOOD BLOOD LEFT HAND  Final   Special Requests   Final    BOTTLES DRAWN AEROBIC AND ANAEROBIC Blood Culture results may not be optimal due to an excessive volume of blood received in culture bottles   Culture   Final    NO GROWTH 4 DAYS Performed at [facility], [address] [place], [place] [address]    Report Status PENDING  Incomplete  CULTURE, BLOOD (ROUTINE X 2) w Reflex to ID Panel     Status: None (Preliminary result)   Collection Time: 06/18/20  7:56 PM   Specimen: BLOOD  Result Value Ref Range Status   Specimen Description BLOOD BLOOD RIGHT HAND  Final   Special Requests   Final    BOTTLES DRAWN AEROBIC AND ANAEROBIC Blood Culture results may not be optimal due to an excessive volume of blood received in culture bottles   Culture   Final    NO GROWTH 4 DAYS Performed at [facility], [address] [place], [place] [address]    Report Status PENDING  Incomplete    Coagulation Studies: No results for input(s): LABPROT, INR in the last 72 hours.  Urinalysis: No results for input(s): COLORURINE, LABSPEC, PHURINE, GLUCOSEU, HGBUR, BILIRUBINUR, KETONESUR, PROTEINUR, UROBILINOGEN, NITRITE, LEUKOCYTESUR in the last 72 hours.  Invalid input(s): APPERANCEUR    Imaging: DG Abd 1 View  Result Date: 06/21/2020 CLINICAL DATA:  Ileus. EXAM: ABDOMEN - 1 VIEW COMPARISON:  CT 06/19/2020.   06/18/2020. FINDINGS: Feeding tube in unchanged position. Left upper quadrant drainage catheter in stable position. Mild colonic prominence. No small bowel distention. No free air. IMPRESSION: 1.  Feeding tube in unchanged position. Left upper quadrant drainage catheter stable position. 2.  Mild colonic prominence. No small bowel distention. No free air. Electronically Signed   By: Marcello Moores  Register   On: 06/21/2020 08:37   DG Chest Port 1 View  Result Date: 06/21/2020 CLINICAL DATA:  Difficulty with extubation EXAM: PORTABLE CHEST 1 VIEW COMPARISON:  June 18, 2020 FINDINGS: Endotracheal tube is been removed. Enteric tube tip is below the diaphragm. Central catheter tip is in the superior vena cava. No pneumothorax. The lungs are clear. The heart size and pulmonary vascularity are normal. No adenopathy. No bone lesions. IMPRESSION: Tube and catheter positions as described without evident pneumothorax. Lungs clear. Cardiac silhouette within normal limits. Electronically Signed   By: Lowella Grip III M.D.   On: 06/21/2020 13:37     Medications:   . dexmedetomidine (PRECEDEX) IV infusion 1.5 mcg/kg/hr (06/22/20 0811)  . dextrose    . meropenem (MERREM) IV Stopped (06/22/20 0536)  . potassium chloride 10 mEq (06/22/20 0950)   . chlorhexidine gluconate (MEDLINE KIT)  15 mL Mouth Rinse BID  . Chlorhexidine Gluconate Cloth  6 each Topical Daily  . clonazePAM  0.5 mg Oral BID  . cloNIDine  0.3 mg Transdermal Weekly  . docusate  100 mg Per Tube BID  . enoxaparin (LOVENOX) injection  0.5 mg/kg Subcutaneous Q24H  . feeding supplement (PROSource TF)  90 mL Per Tube QID  . feeding supplement (VITAL 1.5 CAL)  1,000 mL Per Tube Q24H  . folic acid  1 mg Intravenous Daily  . furosemide  40 mg Intravenous Daily  . ipratropium-albuterol  3 mL Nebulization Q6H  . lactulose  30 g Oral TID  . mouth rinse  15 mL Mouth Rinse 10 times per day  . metoCLOPramide (REGLAN) injection  5 mg Intravenous Q8H  . metoprolol tartrate  25 mg Per Tube BID  . multivitamin with minerals  1 tablet Oral Daily  . pantoprazole (PROTONIX) IV  40 mg Intravenous Q24H  . sodium chloride flush  10-40 mL Intracatheter Q12H   . sodium chloride flush  3 mL Intravenous Q12H  . sodium chloride flush  5 mL Intracatheter Q8H  . thiamine  100 mg Oral Daily   Or  . thiamine  100 mg Intravenous Daily   acetaminophen (TYLENOL) oral liquid 160 mg/5 mL, metoprolol tartrate, ondansetron (ZOFRAN) IV, sodium chloride flush, sodium phosphate  Assessment/ Plan:  Mr. CHESTON COURY is a 30 y.o. black male with history of left glenohumeral joint dislocation with greater tuberosity fracture status post left shoulder reduction, hypertension, anxiety, alcohol abuse, depression, who was admitted to [facility] on 06/11/2020 for evaluation of seizure and found to have severe pancreatitis.  1.  Acute kidney injury secondary to acute tubular necrosis. Baseline creatinine of 1.04 with normal GFR on 12/01/2018.  Acute kidney injury secondary to ATN due to severe underlying pancreatitis and IV contrast exposure on 06/15/20.   No indication for dialysis.  Creatinine improving. Nonoliguric urine output.   2.  Hypernatremia. Free water deficit  - Increased free water flushes - Start dextrose infusion.   3.  Acute respiratory failure: required intubation and mechanical ventilation. Now extubated.   4. Anemia with renal failure: hemoglobin 8.2, no indication for ESA   LOS: 10 Marcus Small 3/10/202210:20 AM

## 2020-06-22 NOTE — Progress Notes (Signed)
Pt weaned off precedex gtt. A&OX3. Passed bedside Yale swallow study. Pt has baseline cough but not with PO intake. PRN lopressor administered for elevated BP and HR. Atarax and Librium prn for CIWA score.

## 2020-06-22 NOTE — Plan of Care (Signed)
  Problem: Clinical Measurements: Goal: Ability to maintain clinical measurements within normal limits will improve Outcome: Progressing Goal: Will remain free from infection Outcome: Progressing Goal: Diagnostic test results will improve Outcome: Progressing Goal: Cardiovascular complication will be avoided Outcome: Progressing   Problem: Activity: Goal: Risk for activity intolerance will decrease Outcome: Progressing   Problem: Elimination: Goal: Will not experience complications related to bowel motility Outcome: Progressing Goal: Will not experience complications related to urinary retention Outcome: Progressing   Problem: Pain Managment: Goal: General experience of comfort will improve Outcome: Progressing   Problem: Skin Integrity: Goal: Risk for impaired skin integrity will decrease Outcome: Progressing   Problem: Education: Goal: Knowledge of General Education information will improve Description: Including pain rating scale, medication(s)/side effects and non-pharmacologic comfort measures Outcome: Not Progressing   Problem: Clinical Measurements: Goal: Respiratory complications will improve Outcome: Not Progressing   Problem: Nutrition: Goal: Adequate nutrition will be maintained Outcome: Not Progressing   Problem: Coping: Goal: Level of anxiety will decrease Outcome: Not Progressing   Problem: Safety: Goal: Ability to remain free from injury will improve Outcome: Not Progressing

## 2020-06-22 NOTE — Progress Notes (Signed)
[facility] hospitalist acceptance note  30 year old male history of alcohol abuse presented to ED on 2/27.  Was found to have seizure activity secondary to severe alcohol withdrawal and associated pancreatitis.  On 3/2 was emergently intubated due to severe DTs and increased work of breathing.  Recurrent imaging demonstrating peripancreatic fluid collection.  Drains placed.  Patient successfully extubated on 3/9.  Infectious disease and nephrology following.  Patient stable for transfer to [facility] service.  [facility] to assume primary care of this patient on 3/11 Signout received via secure chat from [facility] attending  Lolita Patella MD

## 2020-06-22 NOTE — Progress Notes (Signed)
Nutrition Follow Up Note   DOCUMENTATION CODES:   Not applicable  INTERVENTION:   Vital 1.5 Cal at 82m/hr- initiate at 330mhr and advance by 10 mL/hr every 8 hours to goal rate is reached  Provide PROSource TF 45 mL BID per tube  Free water flushes 3076m4 hours to maintain tube patency   Goal regimen provides 2240 kcal, 119 grams of protein, 1280 mL H2O daily  NUTRITION DIAGNOSIS:   Inadequate oral intake related to inability to eat as evidenced by NPO status.  GOAL:   Patient will meet greater than or equal to 90% of their needs  -not met   MONITOR:   Vent status,Labs,Weight trends,TF tolerance,I & O's  ASSESSMENT:   [age]ar old male with PMHx of HTN, anxiety, EtOH abuse admitted with left glenohumeral joint dislocation with greater tuberosity fracture s/p left shoulder reduction in ER, pancreatitis, seizure activity secondary to severe EtOH withdrawal.   Pt s/p IR guided post-pyloric nasogastric tube and drain 3/3  Pt extubated 3/9. NGT remains in place; will plan to restart tube feeds today. Pt is having bowel function now. JP drain in place with 22m39mtput. UOP [id number] chart, pt up ~30lbs since admit; pt +9.1L on his I & O's.   Medications reviewed and include: colace, lovenox, folic acid, reglan, MVI, protonix, precedex, 5% dextrose @100ml /hr, thiamine, meropenem, KCl  Labs reviewed: Na 148(H), K 3.3(L), creat 1.31(H), P 3.9 wnl, Mg 2.3 wnl Wbc- 21.0(H), Hgb 9.0(L), Hct 29.2(L)  Diet Order:   Diet Order            Diet NPO time specified  Diet effective now                EDUCATION NEEDS:   No education needs have been identified at this time  Skin:  Skin Assessment: Reviewed RN Assessment  Last BM:  3/10- type 7  Height:   Ht Readings from Last 1 Encounters:  06/21/20 5' 5"  (1.651 m)   Weight:   Wt Readings from Last 1 Encounters:  06/22/20 103.4 kg   Ideal Body Weight:  61.8 kg  BMI:  Body mass index is 37.93 kg/m.  Estimated  Nutritional Needs:   Kcal:  2200-2500kcal/day  Protein:  110-125g/day  Fluid:  >/= 2 L/day  [name], LDN Please refer to [facility] and/or RD on-call/weekend/after hours pager

## 2020-06-23 ENCOUNTER — Inpatient Hospital Stay: Payer: Self-pay

## 2020-06-23 LAB — CBC
HCT: 33.1 % — ABNORMAL LOW (ref 39.0–52.0)
Hemoglobin: 10.5 g/dL — ABNORMAL LOW (ref 13.0–17.0)
MCH: 30 pg (ref 26.0–34.0)
MCHC: 31.7 g/dL (ref 30.0–36.0)
MCV: 94.6 fL (ref 80.0–100.0)
Platelets: 882 10*3/uL — ABNORMAL HIGH (ref 150–400)
RBC: 3.5 MIL/uL — ABNORMAL LOW (ref 4.22–5.81)
RDW: 13.2 % (ref 11.5–15.5)
WBC: 22.6 10*3/uL — ABNORMAL HIGH (ref 4.0–10.5)
nRBC: 0.1 % (ref 0.0–0.2)

## 2020-06-23 LAB — MAGNESIUM: Magnesium: 2.2 mg/dL (ref 1.7–2.4)

## 2020-06-23 LAB — BASIC METABOLIC PANEL
Anion gap: 13 (ref 5–15)
BUN: 19 mg/dL (ref 6–20)
CO2: 28 mmol/L (ref 22–32)
Calcium: 9.1 mg/dL (ref 8.9–10.3)
Chloride: 107 mmol/L (ref 98–111)
Creatinine, Ser: 1.15 mg/dL (ref 0.61–1.24)
GFR, Estimated: >60 mL/min (ref >60)
Glucose, Bld: 119 mg/dL — ABNORMAL HIGH (ref 70–99)
Potassium: 2.9 mmol/L — ABNORMAL LOW (ref 3.5–5.1)
Sodium: 148 mmol/L — ABNORMAL HIGH (ref 135–145)

## 2020-06-23 LAB — GLUCOSE, CAPILLARY
Glucose-Capillary: 109 mg/dL — ABNORMAL HIGH (ref 70–99)
Glucose-Capillary: 96 mg/dL (ref 70–99)
Glucose-Capillary: 104 mg/dL — ABNORMAL HIGH (ref 70–99)
Glucose-Capillary: 119 mg/dL — ABNORMAL HIGH (ref 70–99)

## 2020-06-23 LAB — CULTURE, BLOOD (ROUTINE X 2)
Culture: NO GROWTH
Culture: NO GROWTH

## 2020-06-23 LAB — LIPASE, BLOOD: Lipase: 48 U/L (ref 11–51)

## 2020-06-23 LAB — PHOSPHORUS: Phosphorus: 2.6 mg/dL (ref 2.5–4.6)

## 2020-06-23 LAB — POTASSIUM: Potassium: 3.1 mmol/L — ABNORMAL LOW (ref 3.5–5.1)

## 2020-06-23 MED ORDER — POTASSIUM CHLORIDE 10 MEQ/50ML IV SOLN
10.0000 meq | INTRAVENOUS | Status: AC
  Administered 2020-06-23 – 2020-06-24 (×4): 10 meq via INTRAVENOUS
  Filled 2020-06-23 (×4): qty 50

## 2020-06-23 MED ORDER — CLONAZEPAM 0.25 MG PO TBDP
1.0000 mg | ORAL_TABLET | Freq: Two times a day (BID) | ORAL | Status: DC
  Administered 2020-06-23 – 2020-06-26 (×7): 1 mg via ORAL
  Filled 2020-06-23: qty 4
  Filled 2020-06-23 (×2): qty 2
  Filled 2020-06-23 (×3): qty 4
  Filled 2020-06-23: qty 2

## 2020-06-23 MED ORDER — DILTIAZEM HCL-DEXTROSE 125-5 MG/125ML-% IV SOLN (PREMIX)
5.0000 mg/h | INTRAVENOUS | Status: DC
  Administered 2020-06-23: 5 mg/h via INTRAVENOUS
  Administered 2020-06-23: 15 mg/h via INTRAVENOUS
  Administered 2020-06-24: 12.5 mg/h via INTRAVENOUS
  Administered 2020-06-24: 15 mg/h via INTRAVENOUS
  Administered 2020-06-24: 7.5 mg/h via INTRAVENOUS
  Administered 2020-06-25 (×2): 5 mg/h via INTRAVENOUS
  Filled 2020-06-23 (×5): qty 125

## 2020-06-23 MED ORDER — POTASSIUM CHLORIDE CRYS ER 20 MEQ PO TBCR
20.0000 meq | EXTENDED_RELEASE_TABLET | ORAL | Status: AC
  Administered 2020-06-23 – 2020-06-24 (×2): 20 meq via ORAL
  Filled 2020-06-23 (×2): qty 1

## 2020-06-23 MED ORDER — ENSURE ENLIVE PO LIQD
237.0000 mL | Freq: Three times a day (TID) | ORAL | Status: DC
  Administered 2020-06-23: 237 mL via ORAL

## 2020-06-23 MED ORDER — POTASSIUM CHLORIDE 10 MEQ/50ML IV SOLN
10.0000 meq | INTRAVENOUS | Status: AC
  Administered 2020-06-23 (×4): 10 meq via INTRAVENOUS
  Filled 2020-06-23 (×4): qty 50

## 2020-06-23 MED ORDER — POTASSIUM CHLORIDE CRYS ER 20 MEQ PO TBCR
40.0000 meq | EXTENDED_RELEASE_TABLET | Freq: Once | ORAL | Status: AC
  Administered 2020-06-23: 40 meq via ORAL
  Filled 2020-06-23: qty 2

## 2020-06-23 MED ORDER — PANTOPRAZOLE SODIUM 40 MG PO TBEC
40.0000 mg | DELAYED_RELEASE_TABLET | Freq: Every day | ORAL | Status: DC
  Administered 2020-06-24 – 2020-06-26 (×3): 40 mg via ORAL
  Filled 2020-06-23 (×3): qty 1

## 2020-06-23 MED ORDER — METOPROLOL TARTRATE 5 MG/5ML IV SOLN
7.5000 mg | Freq: Once | INTRAVENOUS | Status: AC
  Administered 2020-06-23: 7.5 mg via INTRAVENOUS
  Filled 2020-06-23: qty 10

## 2020-06-23 MED ORDER — METOCLOPRAMIDE HCL 5 MG/ML IJ SOLN
5.0000 mg | Freq: Four times a day (QID) | INTRAMUSCULAR | Status: DC
  Administered 2020-06-23: 5 mg via INTRAVENOUS
  Filled 2020-06-23: qty 2

## 2020-06-23 MED ORDER — METOPROLOL TARTRATE 25 MG PO TABS
25.0000 mg | ORAL_TABLET | Freq: Three times a day (TID) | ORAL | Status: DC
  Administered 2020-06-23 – 2020-06-26 (×8): 25 mg via ORAL
  Filled 2020-06-23 (×8): qty 1

## 2020-06-23 MED ORDER — MAGNESIUM SULFATE IN D5W 1-5 GM/100ML-% IV SOLN
1.0000 g | Freq: Once | INTRAVENOUS | Status: AC
  Administered 2020-06-23: 1 g via INTRAVENOUS
  Filled 2020-06-23: qty 100

## 2020-06-23 MED ORDER — SODIUM CHLORIDE 0.9 % IV SOLN
100.0000 mg | INTRAVENOUS | Status: DC
  Administered 2020-06-24 – 2020-06-25 (×2): 100 mg via INTRAVENOUS
  Filled 2020-06-23 (×4): qty 100

## 2020-06-23 MED ORDER — SODIUM CHLORIDE 0.9 % IV SOLN
200.0000 mg | Freq: Once | INTRAVENOUS | Status: AC
  Administered 2020-06-23: 200 mg via INTRAVENOUS
  Filled 2020-06-23: qty 200

## 2020-06-23 MED ORDER — FUROSEMIDE 10 MG/ML IJ SOLN
40.0000 mg | Freq: Every day | INTRAMUSCULAR | Status: DC
  Administered 2020-06-24 – 2020-06-25 (×2): 40 mg via INTRAVENOUS
  Filled 2020-06-23 (×2): qty 4

## 2020-06-23 MED ORDER — PANCRELIPASE (LIP-PROT-AMYL) 12000-38000 UNITS PO CPEP
24000.0000 [IU] | ORAL_CAPSULE | Freq: Three times a day (TID) | ORAL | Status: DC
  Administered 2020-06-23 – 2020-06-26 (×9): 24000 [IU] via ORAL
  Filled 2020-06-23 (×10): qty 2

## 2020-06-23 MED ORDER — MIRTAZAPINE 15 MG PO TABS
15.0000 mg | ORAL_TABLET | Freq: Every day | ORAL | Status: DC
  Administered 2020-06-23 – 2020-06-25 (×3): 15 mg via ORAL
  Filled 2020-06-23 (×3): qty 1

## 2020-06-23 MED ORDER — NICARDIPINE HCL IN NACL 20-0.86 MG/200ML-% IV SOLN
3.0000 mg/h | INTRAVENOUS | Status: DC
  Administered 2020-06-23: 5 mg/h via INTRAVENOUS
  Filled 2020-06-23: qty 200

## 2020-06-23 MED ORDER — METOCLOPRAMIDE HCL 5 MG/ML IJ SOLN: 5.0000 mg | Freq: Four times a day (QID) | INTRAMUSCULAR | Status: DC | PRN

## 2020-06-23 MED ORDER — SODIUM CHLORIDE 0.9 % IV SOLN
3.0000 g | Freq: Four times a day (QID) | INTRAVENOUS | Status: DC
  Administered 2020-06-23 – 2020-06-26 (×12): 3 g via INTRAVENOUS
  Filled 2020-06-23 (×11): qty 3
  Filled 2020-06-23: qty 8
  Filled 2020-06-23: qty 3
  Filled 2020-06-23: qty 8

## 2020-06-23 NOTE — Progress Notes (Signed)
PT Cancellation Note  Patient Details Name: Marcus Small MRN: [id number] DOB: [birth year]   Cancelled Treatment:    Reason Eval/Treat Not Completed: Medical issues which prohibited therapy (Per chart review, BP and HR remain elevated to a point that would preclude exercise at this time. WIll defer physical therapy evaluation to later date/time.)   Ramona Ruark C 06/23/2020, 8:34 AM

## 2020-06-23 NOTE — Progress Notes (Addendum)
[facility]  - [place] at [facility]   PATIENT NAME: Marcus Small    MR#:  [id number]  DATE OF BIRTH:  17-Aug-1990  SUBJECTIVE:   Awake and alert. Extubated two days ago. Able to swallow. Complains of mild abdominal pain. Marcus family in the room. REVIEW OF SYSTEMS:   Review of Systems  Constitutional: Positive for malaise/fatigue. Negative for chills, fever and weight loss.  HENT: Negative for ear discharge, ear pain and nosebleeds.   Eyes: Negative for blurred vision, pain and discharge.  Respiratory: Negative for sputum production, shortness of breath, wheezing and stridor.   Cardiovascular: Negative for chest pain, palpitations, orthopnea and PND.  Gastrointestinal: Positive for abdominal pain. Negative for diarrhea, nausea and vomiting.  Genitourinary: Negative for frequency and urgency.  Musculoskeletal: Negative for back pain and joint pain.  Neurological: Positive for weakness. Negative for sensory change, speech change and focal weakness.  Psychiatric/Behavioral: Negative for depression and hallucinations. The patient is not nervous/anxious.    Tolerating Diet:yes Tolerating PT:   DRUG ALLERGIES:   Allergies  Allergen Reactions  . Peanut-Containing Drug Products Itching  . Shellfish Allergy     VITALS:  Blood pressure (!) 161/111, pulse (!) 140, temperature 99.1 F (37.3 C), temperature source Axillary, resp. rate (!) 30, height 5\' 5"  (1.651 m), weight 98.9 kg, SpO2 94 %.  PHYSICAL EXAMINATION:   Physical Exam  GENERAL:  30 y.o.-year-old patient lying in the bed with Marcus acute distress.  LUNGS: Normal breath sounds bilaterally, Marcus wheezing, rales, rhonchi. Marcus use of accessory muscles of respiration.  CARDIOVASCULAR: S1, S2 normal. Marcus murmurs, rubs, or gallops.  ABDOMEN: Soft, + tender, nondistended. Bowel sounds present. EXTREMITIES: Marcus cyanosis, clubbing or edema b/l.    NEUROLOGIC: Cranial nerves II through XII are intact. Marcus focal Motor or  sensory deficits b/l.   PSYCHIATRIC:  patient is alert and oriented x 2 SKIN: Marcus obvious rash, lesion, or ulcer.   LABORATORY PANEL:  CBC Recent Labs  Lab 06/23/20 0443  WBC 22.6*  HGB 10.5*  HCT 33.1*  PLT 882*    Chemistries  Recent Labs  Lab 06/18/20 0400 06/19/20 0359 06/23/20 0443  NA 144   < > 148*  K 3.2*   < > 2.9*  CL 111   < > 107  CO2 25   < > 28  GLUCOSE 166*   < > 119*  BUN 8   < > 19  CREATININE 1.73*   < > 1.15  CALCIUM 8.3*   < > 9.1  MG 1.8   < > 2.2  AST 36  --   --   ALT 22  --   --   ALKPHOS 110  --   --   BILITOT 1.4*  --   --    < > = values in this interval not displayed.   Cardiac Enzymes Marcus results for input(s): TROPONINI in the last 168 hours. RADIOLOGY:  Marcus results found. ASSESSMENT AND PLAN:  Marcus Small is a 30 y.o. male with medical history significant of alcohol use, anxiety, hypertension who presents following seizure at home. Patient's family reports that he had a tonic-clonic seizure that lasted for about 1 minute.  Patient has a history of heavy alcohol use  Severe DT's with heavy Alcohol abuse Alcohol withdrawal Alcohol withdrawal seizures -- patient was intubated on 06/14/20 due to increased work of breathing and severe DTs. He was extubated 06/21/20 -- CT head and C-spine were without acute abnormalities --  Marcus history of seizures, presentation consistent with alcohol withdrawal seizure. --Continue CIWA with prn chlordiazepoxide, -- Marcus signs of withdrawal.  Acute severe Pancreatitis   Leukocytosis --Patient with evidence of pancreatitis on CT and lipase elevated to 629 in the setting of known heavy alcohol use also admitted for withdrawal and withdrawal seizure as above. -- Patient underwent drainage of fluid collection around the pancreas. Marcus growth so far. -- continues spike fever. -- Infectious disease consultation appreciated-- currently on Unasyn and Anidulafungin (antifungal) --BC negative so far --cont  Pancrealipase  AKI Hypokalemia AGMA -- pharmacy to replete electrolyte's  Left shoulder dislocation and fracture -- This occurred before the witnessed seizure, possibility that he had an unwitnessed 1 prior does not recall trauma --- conservative management with slight  Tachycardia Troponin elevation -- Initial high-sensitivity troponin elevated to 93 in the setting of seizure and tachycardia, suspect demand ischemia. --- Trend troponin  malignant hypertension -- clonidine and metoprolol. -- PRN hydralazine  Depression -- on clonazepam --will have Dr Toni Amend revisit pt. --there is mention of SI by family Small notes.    Procedures: Family communication :none today Consults : ID,PCCM CODE STATUS: full DVT Prophylaxis :lovenox Level of care: ICU Status is: Inpatient  Remains inpatient appropriate because:Inpatient level of care appropriate due to severity of illness   Dispo: The patient is from: Home              Anticipated d/c is to: Home              Patient currently is not medically stable to d/c.   Difficult to place patient Marcus        TOTAL TIME TAKING CARE OF THIS PATIENT: 25 minutes.  >50% time spent on counselling and coordination of care  Note: This dictation was prepared with Dragon dictation along with smaller phrase technology. Any transcriptional errors that result from this process are unintentional.  Marcus Small M.D    [facility]   CC: Primary care physician; Patient, Marcus Pcp PerPatient ID: Marcus Small, male   DOB: 09/22/1990, 30 y.o.   MRN: [id number]

## 2020-06-23 NOTE — Progress Notes (Signed)
[facility] ADULT ICU REPLACEMENT PROTOCOL   The patient does apply for the [facility] Adult ICU Electrolyte Replacment Protocol based on the criteria listed below:   1. Is GFR >/= 30 ml/min? Yes.    Patient's GFR today is >60 2. Is SCr </= 2? Yes.   Patient's SCr is 1.15 ml/kg/hr 3. Did SCr increase >/= 0.5 in 24 hours? No. 4. Abnormal electrolyte(s): k 2.9 5. Ordered repletion with: protocol 6. If a panic level lab has been reported, has the CCM MD in charge been notified? No..   Physician:    Markus Daft A 06/23/2020 6:50 AM

## 2020-06-23 NOTE — Progress Notes (Signed)
Date of Admission:  06/11/2020   Antibiotic Use   cefepime  06/12/20-1 dose Metronidazole 1 dose 06/12/20 Vancomycin 2/28 Zosyn 3/2>>3/9 Meropenem 3/9>>3/11 Anidulafungin 3/11 Unasyn 3/11    LDA PICC 06/14/20 Rt arm Biliary tube 06/15/20  MICRO BC 06/19/20 NG  ID: Marcus Small is a 30 y.o. male  Principal Problem:   Delirium tremens (Marcus Small) Active Problems:   Alcohol withdrawal seizure (Marcus Small)   HTN (hypertension)   Alcohol withdrawal (Marcus Small)   Alcohol use   Alcoholic pancreatitis   Leukocytosis   AKI (acute kidney injury) (Marcus Small)   Hypokalemia   High anion gap metabolic acidosis   Closed dislocation of left shoulder   Elevated troponin   Tachycardia   Acute pancreatitis   Pressure injury of skin    Subjective: Pt is fatigued Says he is okay  Medications:  . chlorhexidine gluconate (MEDLINE KIT)  15 mL Mouth Rinse BID  . Chlorhexidine Gluconate Cloth  6 each Topical Daily  . clonazepam  1 mg Oral BID  . cloNIDine  0.3 mg Transdermal Weekly  . docusate  100 mg Per Tube BID  . enoxaparin (LOVENOX) injection  0.5 mg/kg Subcutaneous Q24H  . feeding supplement (PROSource TF)  45 mL Per Tube BID  . folic acid  1 mg Intravenous Daily  . free water  30 mL Per Tube Q4H  . ipratropium-albuterol  3 mL Nebulization Q6H  . lipase/protease/amylase  24,000 Units Oral TID AC  . mouth rinse  15 mL Mouth Rinse 10 times per day  . metoCLOPramide (REGLAN) injection  5 mg Intravenous Q6H  . metoprolol tartrate  25 mg Per Tube BID  . multivitamin with minerals  1 tablet Oral Daily  . pantoprazole sodium  40 mg Per Tube Daily  . sodium chloride flush  10-40 mL Intracatheter Q12H  . sodium chloride flush  3 mL Intravenous Q12H  . sodium chloride flush  5 mL Intracatheter Q8H  . thiamine  100 mg Oral Daily   Or  . thiamine  100 mg Intravenous Daily    Objective: Vital signs in last 24 hours: Patient Vitals for the past 24 hrs:  BP Temp Temp src Pulse Resp SpO2 Weight  06/23/20  1115 (!) 161/111 99.1 F (37.3 C) Axillary (!) 140 (!) 30 94 % --  06/23/20 1100 138/82 -- -- (!) 140 (!) 27 91 % --  06/23/20 1045 136/86 -- -- (!) 142 (!) 27 90 % --  06/23/20 1030 134/84 -- -- (!) 139 16 97 % --  06/23/20 1015 (!) 167/89 -- -- (!) 138 12 100 % --  06/23/20 1000 (!) 164/95 -- -- (!) 126 20 98 % --  06/23/20 0900 (!) 160/66 (!) 101.8 F (38.8 C) Axillary (!) 146 (!) 27 95 % --  06/23/20 0800 (!) 174/103 -- -- (!) 148 (!) 32 (!) 88 % --  06/23/20 0500 (!) 181/94 100.3 F (37.9 C) Oral (!) 132 (!) 28 95 % 98.9 kg  06/23/20 0400 (!) 176/97 -- -- (!) 133 (!) 27 98 % --  06/23/20 0330 (!) 185/98 -- -- (!) 142 (!) 23 95 % --  06/23/20 0100 (!) 194/101 -- -- (!) 121 (!) 26 91 % --  06/23/20 0000 (!) 196/99 100.2 F (37.9 C) Oral (!) 134 (!) 22 92 % --  06/22/20 2300 (!) 177/101 -- -- (!) 134 (!) 24 97 % --  06/22/20 2200 (!) 165/78 99.8 F (37.7 C) Oral (!) 149 (!) 23 98 % --  06/22/20 2100 (!) 198/133 -- -- (!) 138 19 94 % --  06/22/20 2000 (!) 181/101 -- -- (!) 122 19 100 % --  06/22/20 1900 (!) 172/104 -- -- (!) 114 (!) 22 98 % --  06/22/20 1830 (!) 188/123 -- -- (!) 119 19 98 % --  06/22/20 1800 (!) 198/131 -- -- (!) 125 (!) 22 97 % --  06/22/20 1730 (!) 170/113 -- -- (!) 111 17 99 % --  06/22/20 1544 -- 99.8 F (37.7 C) Axillary -- -- -- --  06/22/20 1530 (!) 148/96 -- -- (!) 102 (!) 28 97 % --  06/22/20 1500 (!) 170/95 -- -- 98 (!) 32 99 % --  06/22/20 1430 (!) 172/94 -- -- 95 (!) 35 96 % --  06/22/20 1400 (!) 157/90 -- -- 97 (!) 28 97 % --  06/22/20 1300 (!) 164/103 -- -- (!) 103 (!) 24 95 % --  06/22/20 1230 (!) 155/100 -- -- 92 (!) 21 98 % --   PHYSICAL EXAM:  General: somnolent- on calling his name he opens eyes and answers simple questions Some resp distress  Head: Normocephalic, without obvious abnormality, atraumatic. Eyes: Conjunctivae clear, anicteric sclerae. Pupils are equal ENT Nares normal. No drainage or sinus tenderness. Lips, mucosa, and  tongue normal. No Thrush Neck: Supple, symmetrical, no adenopathy, thyroid: non tender no carotid bruit and no JVD. Back: No CVA tenderness. Lungs: b/l air entry decreased bases Heart: tachycardia Abdomen: Soft,  distended. Bowel sounds normal. No masses left upper quadrant drain Extremities: atraumatic, no cyanosis.some  edema. No clubbing Skin: No rashes or lesions. Or bruising Lymph: Cervical, supraclavicular normal. Neurologic: Grossly non-focal  Lab Results Recent Labs    06/22/20 0400 06/23/20 0443  WBC 21.0* 22.6*  HGB 9.0* 10.5*  HCT 29.2* 33.1*  NA 148* 148*  K 3.3* 2.9*  CL 107 107  CO2 29 28  BUN 19 19  CREATININE 1.31* 1.15   Microbiology: 06/19/20 BC NG Studies/Results: CXR 06/23/20- normal  Assessment/Plan: Acute pancreatitis Patient has had fever since 06/18/2020.  Blood culture from 06/19/2020 was negative.  No UTI He did not respond to Zosyn or meropenem. We will discontinue meropenem today.  We will start him on anidulafungin along with Unasyn.  chest x-ray  rule out aspiration pneumonitis.  Seizures due to alcohol withdrawal  Acute hypoxic respiratory failure status post extubation.  Discussed the management with care team

## 2020-06-23 NOTE — Progress Notes (Signed)
Nutrition Follow Up Note   DOCUMENTATION CODES:   Not applicable  INTERVENTION:   Ensure Enlive po TID, each supplement provides 350 kcal and 20 grams of protein  MVI po daily   Low Fat diet- via [employer]  Pt at high refeed risk; recommend monitor potassium, magnesium and phosphorus labs daily until stable  NUTRITION DIAGNOSIS:   Inadequate oral intake related to inability to eat as evidenced by NPO status.  GOAL:   Patient will meet greater than or equal to 90% of their needs  -not met   MONITOR:   Vent status,Labs,Weight trends,TF tolerance,I & O's  ASSESSMENT:   30 year old male with PMHx of HTN, anxiety, EtOH abuse admitted with left glenohumeral joint dislocation with greater tuberosity fracture s/p left shoulder reduction in ER, pancreatitis, seizure activity secondary to severe EtOH withdrawal.   Met with pt in room today. Pt is sitting up and is able to communicate today. Pt reports that he is feeling "peachy" today. Pt removed his NGT overnight. Pt has been able to tolerate clear liquid diet and some Ensure for breakfast. Pt reports that he is hungry and is requesting italian ice at time of RD visit. RD discussed with pt the importance of adequate nutrition needed to preserve lean muscle. Pt reports that he is willing to continue drinking the Ensure. Will initiate pt on a low fat diet today and monitor his intakes. Pt will need post pyloric nasogastric tube replaced if he does not tolerate oral intake. Pt did vomit once this morning.   Per chart, pt up ~20lbs since admit.   Medications reviewed and include: colace, lovenox, creon, folic acid, reglan, MVI, protonix, thiamine, unasyn, Eraxis, Mg sulfate   Labs reviewed: Na 148(H), K 2.9(L), P 2.6 wnl, Mg 2.2 wnl Wbc- 22.6(H), Hgb 10.5(L), Hct 33.1(L)  Diet Order:   Diet Order            Diet regular Room service appropriate? Yes; Fluid consistency: Thin  Diet effective now                EDUCATION NEEDS:    No education needs have been identified at this time  Skin:  Skin Assessment: Reviewed RN Assessment  Last BM:  3/11- type 7  Height:   Ht Readings from Last 1 Encounters:  06/21/20 5' 5"  (1.651 m)   Weight:   Wt Readings from Last 1 Encounters:  06/23/20 98.9 kg   Ideal Body Weight:  61.8 kg  BMI:  Body mass index is 36.28 kg/m.  Estimated Nutritional Needs:   Kcal:  2200-2500kcal/day  Protein:  110-125g/day  Fluid:  >/= 2 L/day  Koleen Distance MS, RD, LDN Please refer to [facility] for RD and/or RD on-call/weekend/after hours pager

## 2020-06-23 NOTE — Progress Notes (Signed)
[facility]  ROUNDING NOTE   Subjective:   Patient eating on his own.   UOP 3936m  Not getting dextrose infusion.  Na 148   Objective:  Vital signs in last 24 hours:  Temp:  [99.8 F (37.7 C)-100.3 F (37.9 C)] 100.3 F (37.9 C) (03/11 0500) Pulse Rate:  [92-149] 132 (03/11 0500) Resp:  [17-35] 28 (03/11 0500) BP: (148-198)/(78-133) 181/94 (03/11 0500) SpO2:  [91 %-100 %] 95 % (03/11 0500) Weight:  [98.9 kg] 98.9 kg (03/11 0500)  Weight change: -4.5 kg Filed Weights   06/21/20 0250 06/22/20 0301 06/23/20 0500  Weight: 103.2 kg 103.4 kg 98.9 kg    Intake/Output: I/O last 3 completed shifts: In: 1912.2 [I.V.:936.4; Other:20; NG/GT:282.5; IV Piggyback:673.3] Out: 55188 [CZYSA:6301 Drains:10]   Intake/Output this shift:  No intake/output data recorded.  Physical Exam: General:  ill  Head: Moist mucosal membranes  Eyes: PERRL  Neck: trachea midline  Lungs:  clear  Heart: Regular rate and rhythm  Abdomen:  Soft  Extremities:  no peripheral edema.  Neurologic: Answering questions , sleepy but oriented  Skin: No lesions  GU: Foley with urine    Basic Metabolic Panel: Recent Labs  Lab 06/19/20 1233 06/20/20 0358 06/21/20 0400 06/22/20 0400 06/23/20 0443  NA 140 144 147* 148* 148*  K 4.1 3.8 3.9 3.3* 2.9*  CL 107 110 111 107 107  CO2 26 26 29 29 28   GLUCOSE 115* 146* 134* 106* 119*  BUN 11 14 22* 19 19  CREATININE 1.72* 1.60* 1.50* 1.31* 1.15  CALCIUM 8.8* 8.1* 8.9 8.9 9.1  MG 2.0 1.8 2.3 2.3 2.2  PHOS 5.2* 5.5* 4.8* 3.9 2.6    Liver Function Tests: Recent Labs  Lab 06/17/20 0450 06/18/20 0400 06/19/20 1233  AST 28 36  --   ALT 22 22  --   ALKPHOS 91 110  --   BILITOT 1.4* 1.4*  --   PROT 6.0* 5.9*  --   ALBUMIN 2.0* 1.9* 2.2*   Recent Labs  Lab 06/18/20 0400 06/19/20 0359 06/20/20 0358 06/21/20 0400 06/22/20 0400 06/23/20 0443  LIPASE 38 44 53* 38 39 48  AMYLASE 64 67 59 48 43  --    No results for input(s):  AMMONIA in the last 168 hours.  CBC: Recent Labs  Lab 06/19/20 0315 06/19/20 1233 06/20/20 0358 06/21/20 0400 06/22/20 0400 06/23/20 0443  WBC 12.2*  --  18.1* 18.9* 21.0* 22.6*  HGB 7.7* 9.8* 8.2* 8.2* 9.0* 10.5*  HCT 26.1* 32.4* 27.9* 27.9* 29.2* 33.1*  MCV 102.0*  --  102.6* 102.2* 97.3 94.6  PLT 368  --  499* 572* 686* 882*    Cardiac Enzymes: No results for input(s): CKTOTAL, CKMB, CKMBINDEX, TROPONINI in the last 168 hours.  BNP: Invalid input(s): POCBNP  CBG: Recent Labs  Lab 06/22/20 1127 06/22/20 1517 06/22/20 1949 06/22/20 2315 06/23/20 0725  GLUCAP 118* 102* 102* 112* 109*    Microbiology: Results for orders placed or performed during the hospital encounter of 06/11/20  Resp Panel by RT-PCR (Flu A&B, Covid) Nasopharyngeal Swab     Status: None   Collection Time: 06/11/20  9:32 PM   Specimen: Nasopharyngeal Swab; Nasopharyngeal(NP) swabs in vial transport medium  Result Value Ref Range Status   SARS Coronavirus 2 by RT PCR NEGATIVE NEGATIVE Final    Comment: (NOTE) SARS-CoV-2 target nucleic acids are NOT DETECTED.  The SARS-CoV-2 RNA is generally detectable in upper respiratory specimens during the acute phase of infection. The  lowest concentration of SARS-CoV-2 viral copies this assay can detect is 138 copies/mL. A negative result does not preclude SARS-Cov-2 infection and should not be used as the sole basis for treatment or other patient management decisions. A negative result may occur with  improper specimen collection/handling, submission of specimen other than nasopharyngeal swab, presence of viral mutation(s) within the areas targeted by this assay, and inadequate number of viral copies(<138 copies/mL). A negative result must be combined with clinical observations, patient history, and epidemiological information. The expected result is Negative.  Fact Sheet for Patients:  EntrepreneurPulse.com.au  Fact Sheet for  Healthcare Providers:  IncredibleEmployment.be  This test is no t yet approved or cleared by the [place] FDA and  has been authorized for detection and/or diagnosis of SARS-CoV-2 by FDA under an Emergency Use Authorization (EUA). This EUA will remain  in effect (meaning this test can be used) for the duration of the COVID-19 declaration under Section 564(b)(1) of the Act, 21 U.S.C.section 360bbb-3(b)(1), unless the authorization is terminated  or revoked sooner.       Influenza A by PCR NEGATIVE NEGATIVE Final   Influenza B by PCR NEGATIVE NEGATIVE Final    Comment: (NOTE) The Xpert Xpress SARS-CoV-2/FLU/RSV plus assay is intended as an aid in the diagnosis of influenza from Nasopharyngeal swab specimens and should not be used as a sole basis for treatment. Nasal washings and aspirates are unacceptable for Xpert Xpress SARS-CoV-2/FLU/RSV testing.  Fact Sheet for Patients: EntrepreneurPulse.com.au  Fact Sheet for Healthcare Providers: IncredibleEmployment.be  This test is not yet approved or cleared by the [place] FDA and has been authorized for detection and/or diagnosis of SARS-CoV-2 by FDA under an Emergency Use Authorization (EUA). This EUA will remain in effect (meaning this test can be used) for the duration of the COVID-19 declaration under Section 564(b)(1) of the Act, 21 U.S.C. section 360bbb-3(b)(1), unless the authorization is terminated or revoked.  Performed at [facility], [address] [place], [place] [address]   Blood culture (routine x 2)     Status: None   Collection Time: 06/12/20 12:17 AM   Specimen: BLOOD  Result Value Ref Range Status   Specimen Description BLOOD RIGHT FA  Final   Special Requests   Final    BOTTLES DRAWN AEROBIC AND ANAEROBIC Blood Culture results may not be optimal due to an inadequate volume of blood received in culture bottles   Culture   Final    NO  GROWTH 5 DAYS Performed at [facility], [address] [place], [place] [address]    Report Status 06/17/2020 FINAL  Final  Blood culture (routine x 2)     Status: None   Collection Time: 06/12/20 12:45 AM   Specimen: BLOOD  Result Value Ref Range Status   Specimen Description BLOOD RIGHT HAND  Final   Special Requests   Final    BOTTLES DRAWN AEROBIC AND ANAEROBIC Blood Culture results may not be optimal due to an inadequate volume of blood received in culture bottles   Culture   Final    NO GROWTH 5 DAYS Performed at [facility], [address], [place], [place] [address]    Report Status 06/17/2020 FINAL  Final  MRSA PCR Screening     Status: None   Collection Time: 06/12/20  3:27 AM   Specimen: Nasopharyngeal  Result Value Ref Range Status   MRSA by PCR NEGATIVE NEGATIVE Final    Comment:  The GeneXpert MRSA Assay (FDA approved for NASAL specimens only), is one component of a comprehensive MRSA colonization surveillance program. It is not intended to diagnose MRSA infection nor to guide or monitor treatment for MRSA infections. Performed at [facility], [place], [place] [address]   Respiratory (~20 pathogens) panel by PCR     Status: None   Collection Time: 06/14/20  5:30 PM   Specimen: Nasopharyngeal Swab; Respiratory  Result Value Ref Range Status   Adenovirus NOT DETECTED NOT DETECTED Final   Coronavirus 229E NOT DETECTED NOT DETECTED Final    Comment: (NOTE) The Coronavirus on the Respiratory Panel, DOES NOT test for the novel  Coronavirus (2019 nCoV)    Coronavirus HKU1 NOT DETECTED NOT DETECTED Final   Coronavirus NL63 NOT DETECTED NOT DETECTED Final   Coronavirus OC43 NOT DETECTED NOT DETECTED Final   Metapneumovirus NOT DETECTED NOT DETECTED Final   Rhinovirus / Enterovirus NOT DETECTED NOT DETECTED Final   Influenza A NOT DETECTED NOT DETECTED Final   Influenza B NOT DETECTED NOT DETECTED Final    Parainfluenza Virus 1 NOT DETECTED NOT DETECTED Final   Parainfluenza Virus 2 NOT DETECTED NOT DETECTED Final   Parainfluenza Virus 3 NOT DETECTED NOT DETECTED Final   Parainfluenza Virus 4 NOT DETECTED NOT DETECTED Final   Respiratory Syncytial Virus NOT DETECTED NOT DETECTED Final   Bordetella pertussis NOT DETECTED NOT DETECTED Final   Bordetella Parapertussis NOT DETECTED NOT DETECTED Final   Chlamydophila pneumoniae NOT DETECTED NOT DETECTED Final   Mycoplasma pneumoniae NOT DETECTED NOT DETECTED Final    Comment: Performed at [facility], [place] [place], [place] [address]  Culture, Respiratory w Gram Stain     Status: None   Collection Time: 06/14/20  5:46 PM   Specimen: Tracheal Aspirate; Respiratory  Result Value Ref Range Status   Specimen Description   Final    TRACHEAL ASPIRATE Performed at [facility], [address], [place], [place] [address]    Special Requests   Final    NONE Performed at [facility], [address] [place], [place] [address]    Gram Stain   Final    MODERATE WBC PRESENT,BOTH PMN AND MONONUCLEAR MODERATE GRAM NEGATIVE RODS FEW GRAM POSITIVE COCCI IN PAIRS IN CLUSTERS    Culture   Final    FEW Normal respiratory flora-no Staph aureus or Pseudomonas seen Performed at [facility], [address], [place], [place] [address]    Report Status 06/17/2020 FINAL  Final  Aerobic/Anaerobic Culture (surgical/deep wound)     Status: None   Collection Time: 06/15/20 12:29 PM   Specimen: Abscess  Result Value Ref Range Status   Specimen Description   Final    ABSCESS Performed at [facility], [address] [place], [place] [address]    Special Requests   Final    NONE Performed at [facility], [address] [place], [place] [address]    Gram Stain NO ORGANISMS SEEN  Final   Culture   Final    No growth aerobically or anaerobically. Performed at [facility], [place] [address] [place], [place] [address]    Report Status 06/20/2020 FINAL  Final  CULTURE, BLOOD (ROUTINE X 2) w Reflex to ID Panel     Status: None   Collection Time: 06/18/20  7:47 PM   Specimen: BLOOD  Result Value Ref Range Status   Specimen Description  BLOOD BLOOD LEFT HAND  Final   Special Requests   Final    BOTTLES DRAWN AEROBIC AND ANAEROBIC Blood Culture results may not be optimal due to an excessive volume of blood received in culture bottles   Culture   Final    NO GROWTH 5 DAYS Performed at [facility], [address] [place], [place] [address]    Report Status 06/23/2020 FINAL  Final  CULTURE, BLOOD (ROUTINE X 2) w Reflex to ID Panel     Status: None   Collection Time: 06/18/20  7:56 PM   Specimen: BLOOD  Result Value Ref Range Status   Specimen Description BLOOD BLOOD RIGHT HAND  Final   Special Requests   Final    BOTTLES DRAWN AEROBIC AND ANAEROBIC Blood Culture results may not be optimal due to an excessive volume of blood received in culture bottles   Culture   Final    NO GROWTH 5 DAYS Performed at [facility], [address] [place], [place] [address]    Report Status 06/23/2020 FINAL  Final    Coagulation Studies: No results for input(s): LABPROT, INR in the last 72 hours.  Urinalysis: No results for input(s): COLORURINE, LABSPEC, PHURINE, GLUCOSEU, HGBUR, BILIRUBINUR, KETONESUR, PROTEINUR, UROBILINOGEN, NITRITE, LEUKOCYTESUR in the last 72 hours.  Invalid input(s): APPERANCEUR    Imaging: DG Chest Port 1 View  Result Date: 06/21/2020 CLINICAL DATA:  Difficulty with extubation EXAM: PORTABLE CHEST 1 VIEW COMPARISON:  June 18, 2020 FINDINGS: Endotracheal tube is been removed. Enteric tube tip is below the diaphragm. Central catheter tip is in the superior vena cava. No pneumothorax. The lungs are clear. The heart size and pulmonary vascularity are normal. No adenopathy. No bone lesions. IMPRESSION: Tube and catheter positions as described without  evident pneumothorax. Lungs clear. Cardiac silhouette within normal limits. Electronically Signed   By: Lowella Grip III M.D.   On: 06/21/2020 13:37     Medications:   . dexmedetomidine (PRECEDEX) IV infusion Stopped (06/22/20 1422)  . feeding supplement (VITAL 1.5 CAL) 1,000 mL (06/22/20 1355)  . meropenem (MERREM) IV 1 g (06/23/20 0546)  . niCARDipine 5 mg/hr (06/23/20 0738)  . potassium chloride 10 mEq (06/23/20 0939)   . chlorhexidine gluconate (MEDLINE KIT)  15 mL Mouth Rinse BID  . Chlorhexidine Gluconate Cloth  6 each Topical Daily  . clonazePAM  0.5 mg Oral BID  . cloNIDine  0.3 mg Transdermal Weekly  . docusate  100 mg Per Tube BID  . enoxaparin (LOVENOX) injection  0.5 mg/kg Subcutaneous Q24H  . feeding supplement (PROSource TF)  45 mL Per Tube BID  . folic acid  1 mg Intravenous Daily  . free water  30 mL Per Tube Q4H  . ipratropium-albuterol  3 mL Nebulization Q6H  . mouth rinse  15 mL Mouth Rinse 10 times per day  . metoCLOPramide (REGLAN) injection  5 mg Intravenous Q8H  . metoprolol tartrate  25 mg Per Tube BID  . multivitamin with minerals  1 tablet Oral Daily  . pantoprazole sodium  40 mg Per Tube Daily  . potassium chloride  40 mEq Oral Once  . sodium chloride flush  10-40 mL Intracatheter Q12H  . sodium chloride flush  3 mL Intravenous Q12H  . sodium chloride flush  5 mL Intracatheter Q8H  . thiamine  100 mg Oral Daily   Or  . thiamine  100 mg Intravenous Daily   acetaminophen (TYLENOL) oral liquid 160 mg/5 mL, chlordiazePOXIDE, hydrALAZINE, hydrOXYzine,  metoprolol tartrate, ondansetron (ZOFRAN) IV, sodium chloride flush, sodium phosphate  Assessment/ Plan:  Mr. Marcus Small is a 30 y.o. black male with history of left glenohumeral joint dislocation with greater tuberosity fracture status post left shoulder reduction, hypertension, anxiety, alcohol abuse, depression, who was admitted to [facility] on 06/11/2020 for evaluation of seizure and found to have  severe pancreatitis.  1.  Acute kidney injury secondary to acute tubular necrosis. Baseline creatinine of 1.04 with normal GFR on 12/01/2018.  Acute kidney injury secondary to ATN due to severe underlying pancreatitis and IV contrast exposure on 06/15/20.   No indication for dialysis.  Creatinine improving. Nonoliguric urine output.   2.  Hypernatremia. Free water deficit  - Should improve with PO intake.   3.  Acute respiratory failure: required intubation and mechanical ventilation. Now extubated.   4. Anemia with renal failure: hemoglobin 10.5, no indication for ESA  Will sign off. Please call with questions.    LOS: 11 Sarath Kolluru 3/11/20229:41 AM

## 2020-06-23 NOTE — Care Plan (Addendum)
After further assessment and discussion of patient's  status and medical condition during multidisciplinary rounds, the plan is outlined as below:  Hypertension: We will switch nicardipine infusion to Cardizem, plan to wean off as beta-blockers are adjusted.  Adjust beta-blockers.  Alcohol withdrawal: Adjust benzodiazepines, high-dose thiamine.  PT/OT  DC neb treatments as no longer in respiratory failure.  Will need Ortho follow-up as outpatient.   Gailen Shelter, MD [facility]   *This note was dictated using voice recognition software/Dragon.  Despite best efforts to proofread, errors can occur which can change the meaning.  Any change was purely unintentional.

## 2020-06-23 NOTE — Consult Note (Signed)
[facility] Face-to-Face Psychiatry Consult   Reason for Consult: Consult for this 30 year old man who has just gone through delirium tremens and is now extubated and becoming more lucid. Referring Physician: Posey Pronto Patient Identification: Marcus Small MRN:  [id number] Principal Diagnosis: Alcohol use Diagnosis:  Principal Problem:   Alcohol use Active Problems:   Alcohol withdrawal seizure ([place])   HTN (hypertension)   Alcohol withdrawal ([place])   Alcoholic pancreatitis   Leukocytosis   AKI (acute kidney injury) ([place])   Hypokalemia   High anion gap metabolic acidosis   Closed dislocation of left shoulder   Elevated troponin   Tachycardia   Acute pancreatitis   Delirium tremens (H[facility])   Pressure injury of skin   Total Time spent with patient: 1 hour  Subjective:   Marcus Small is a 30 y.o. male patient admitted with "I have no memory".  HPI: Patient seen chart reviewed.  29 year old man who came into the hospital with acute seizures probably from having abruptly stopped drinking.  Subsequently developed complex alcohol withdrawal with agitation and delirium and required extended treatment in the intensive care unit.  Now he is extubated and waking up.  Patient says he has no memory of his whole hospitalization.  The last thing he remembers was his father saying to him "boy, why are you shaking?'  Patient says he had been drinking very heavily prior to coming into the hospital.  Does not remember exactly how long it had been that he had stopped before his seizures most of started.  Denies that he had been abusing other drugs.  Patient endorses some chronic depression and anxiety.  Today his mood however is a little blunted and confused.  Denies any suicidal thought.  Denies feeling hopeless.  Denies acute anxiety or panicky feelings.  Expresses interest in getting into some treatment for his mood and anxiety  Past Psychiatric History: Unclear but from what I can tell him from what he  says never really had any kind of outpatient or inpatient substance abuse treatment in the past.  He is tried to stop drinking independently and has been able to go for months at a time without it.  This is the worst withdrawal he is ever had  Risk to Self:   Risk to Others:   Prior Inpatient Therapy:   Prior Outpatient Therapy:    Past Medical History:  Past Medical History:  Diagnosis Date  . Anxiety   . Hypertension    History reviewed. No pertinent surgical history. Family History:  Family History  Problem Relation Age of Onset  . Hypertension Mother    Family Psychiatric  History: None reported Social History:  Social History   Substance and Sexual Activity  Alcohol Use Yes  . Alcohol/week: 18.0 standard drinks  . Types: 18 Cans of beer per week   Comment: daily - beer plus 2 pints of liquor a day     Social History   Substance and Sexual Activity  Drug Use Not on file    Social History   Socioeconomic History  . Marital status: Single    Spouse name: Not on file  . Number of children: Not on file  . Years of education: Not on file  . Highest education level: Not on file  Occupational History  . Not on file  Tobacco Use  . Smoking status: Current Every Day Smoker    Packs/day: 0.50    Types: Cigarettes    Last attempt to quit: 04/17/2015  Years since quitting: 5.1  . Smokeless tobacco: Never Used  Substance and Sexual Activity  . Alcohol use: Yes    Alcohol/week: 18.0 standard drinks    Types: 18 Cans of beer per week    Comment: daily - beer plus 2 pints of liquor a day  . Drug use: Not on file  . Sexual activity: Not on file  Other Topics Concern  . Not on file  Social History Narrative  . Not on file   Social Determinants of Health   Financial Resource Strain: Not on file  Food Insecurity: Not on file  Transportation Needs: Not on file  Physical Activity: Not on file  Stress: Not on file  Social Connections: Not on file   Additional  Social History:    Allergies:   Allergies  Allergen Reactions  . Peanut-Containing Drug Products Itching  . Shellfish Allergy     Labs:  Results for orders placed or performed during the hospital encounter of 06/11/20 (from the past 48 hour(s))  Glucose, capillary     Status: Abnormal   Collection Time: 06/21/20  7:11 PM  Result Value Ref Range   Glucose-Capillary 120 (H) 70 - 99 mg/dL    Comment: Glucose reference range applies only to samples taken after fasting for at least 8 hours.  Glucose, capillary     Status: Abnormal   Collection Time: 06/21/20 11:03 PM  Result Value Ref Range   Glucose-Capillary 116 (H) 70 - 99 mg/dL    Comment: Glucose reference range applies only to samples taken after fasting for at least 8 hours.  Glucose, capillary     Status: Abnormal   Collection Time: 06/22/20  3:09 AM  Result Value Ref Range   Glucose-Capillary 117 (H) 70 - 99 mg/dL    Comment: Glucose reference range applies only to samples taken after fasting for at least 8 hours.  Magnesium     Status: None   Collection Time: 06/22/20  4:00 AM  Result Value Ref Range   Magnesium 2.3 1.7 - 2.4 mg/dL    Comment: Performed at [facility], [address] [place], [place] [address]  Phosphorus     Status: None   Collection Time: 06/22/20  4:00 AM  Result Value Ref Range   Phosphorus 3.9 2.5 - 4.6 mg/dL    Comment: Performed at [facility], [address] [place], [place] [address]  Amylase     Status: None   Collection Time: 06/22/20  4:00 AM  Result Value Ref Range   Amylase 43 28 - 100 U/L    Comment: Performed at [facility], [address] [place], [place] [address]  Lipase, blood     Status: None   Collection Time: 06/22/20  4:00 AM  Result Value Ref Range   Lipase 39 11 - 51 U/L    Comment: Performed at [facility], [address] [place],  [address]  Basic metabolic panel     Status: Abnormal   Collection Time: 06/22/20   4:00 AM  Result Value Ref Range   Sodium 148 (H) 135 - 145 mmol/L   Potassium 3.3 (L) 3.5 - 5.1 mmol/L   Chloride 107 98 - 111 mmol/L   CO2 29 22 - 32 mmol/L   Glucose, Bld 106 (H) 70 - 99 mg/dL    Comment: Glucose reference range applies only to samples taken after fasting for at least 8 hours.   BUN 19 6 - 20 mg/dL  Creatinine, Ser 1.31 (H) 0.61 - 1.24 mg/dL   Calcium 8.9 8.9 - 10.3 mg/dL   GFR, Estimated >60 >60 mL/min    Comment: (NOTE) Calculated using the CKD-EPI Creatinine Equation (2021)    Anion gap 12 5 - 15    Comment: Performed at [facility], [address] [place], [place] [address]  Procalcitonin     Status: None   Collection Time: 06/22/20  4:00 AM  Result Value Ref Range   Procalcitonin 0.43 ng/mL    Comment:        Interpretation: PCT (Procalcitonin) <= 0.5 ng/mL: Systemic infection (sepsis) is not likely. Local bacterial infection is possible. (NOTE)       Sepsis PCT Algorithm           Lower Respiratory Tract                                      Infection PCT Algorithm    ----------------------------     ----------------------------         PCT < 0.25 ng/mL                PCT < 0.10 ng/mL          Strongly encourage             Strongly discourage   discontinuation of antibiotics    initiation of antibiotics    ----------------------------     -----------------------------       PCT 0.25 - 0.50 ng/mL            PCT 0.10 - 0.25 ng/mL               OR       >80% decrease in PCT            Discourage initiation of                                            antibiotics      Encourage discontinuation           of antibiotics    ----------------------------     -----------------------------         PCT >= 0.50 ng/mL              PCT 0.26 - 0.50 ng/mL               AND        <80% decrease in PCT             Encourage initiation of                                             antibiotics       Encourage continuation           of antibiotics     ----------------------------     -----------------------------        PCT >= 0.50 ng/mL                  PCT > 0.50 ng/mL               AND  increase in PCT                  Strongly encourage                                      initiation of antibiotics    Strongly encourage escalation           of antibiotics                                     -----------------------------                                           PCT <= 0.25 ng/mL                                                 OR                                        > 80% decrease in PCT                                      Discontinue / Do not initiate                                             antibiotics  Performed at [facility], [address] [place], [place] [address]   CBC     Status: Abnormal   Collection Time: 06/22/20  4:00 AM  Result Value Ref Range   WBC 21.0 (H) 4.0 - 10.5 K/uL   RBC 3.00 (L) 4.22 - 5.81 MIL/uL   Hemoglobin 9.0 (L) 13.0 - 17.0 g/dL   HCT 29.2 (L) 39.0 - 52.0 %   MCV 97.3 80.0 - 100.0 fL   MCH 30.0 26.0 - 34.0 pg   MCHC 30.8 30.0 - 36.0 g/dL   RDW 13.3 11.5 - 15.5 %   Platelets 686 (H) 150 - 400 K/uL   nRBC 0.1 0.0 - 0.2 %    Comment: Performed at [facility], [address] [place], [place] [address]  Glucose, capillary     Status: None   Collection Time: 06/22/20  7:14 AM  Result Value Ref Range   Glucose-Capillary 84 70 - 99 mg/dL    Comment: Glucose reference range applies only to samples taken after fasting for at least 8 hours.  Glucose, capillary     Status: Abnormal   Collection Time: 06/22/20 11:27 AM  Result Value Ref Range   Glucose-Capillary 118 (H) 70 - 99 mg/dL    Comment: Glucose reference range applies only to samples taken after fasting for at least 8 hours.  Glucose, capillary     Status: Abnormal   Collection Time: 06/22/20  3:17 PM  Result Value Ref Range  Glucose-Capillary 102 (H) 70 - 99 mg/dL    Comment: Glucose reference range  applies only to samples taken after fasting for at least 8 hours.  Glucose, capillary     Status: Abnormal   Collection Time: 06/22/20  7:49 PM  Result Value Ref Range   Glucose-Capillary 102 (H) 70 - 99 mg/dL    Comment: Glucose reference range applies only to samples taken after fasting for at least 8 hours.  Glucose, capillary     Status: Abnormal   Collection Time: 06/22/20 11:15 PM  Result Value Ref Range   Glucose-Capillary 112 (H) 70 - 99 mg/dL    Comment: Glucose reference range applies only to samples taken after fasting for at least 8 hours.  Magnesium     Status: None   Collection Time: 06/23/20  4:43 AM  Result Value Ref Range   Magnesium 2.2 1.7 - 2.4 mg/dL    Comment: Performed at [facility], [address] [place], [place] [address]  Phosphorus     Status: None   Collection Time: 06/23/20  4:43 AM  Result Value Ref Range   Phosphorus 2.6 2.5 - 4.6 mg/dL    Comment: Performed at [facility], [address] [place], [place] [address]  Lipase, blood     Status: None   Collection Time: 06/23/20  4:43 AM  Result Value Ref Range   Lipase 48 11 - 51 U/L    Comment: Performed at [facility], [address] [place], [place] [address]  Basic metabolic panel     Status: Abnormal   Collection Time: 06/23/20  4:43 AM  Result Value Ref Range   Sodium 148 (H) 135 - 145 mmol/L   Potassium 2.9 (L) 3.5 - 5.1 mmol/L   Chloride 107 98 - 111 mmol/L   CO2 28 22 - 32 mmol/L   Glucose, Bld 119 (H) 70 - 99 mg/dL    Comment: Glucose reference range applies only to samples taken after fasting for at least 8 hours.   BUN 19 6 - 20 mg/dL   Creatinine, Ser 1.15 0.61 - 1.24 mg/dL   Calcium 9.1 8.9 - 10.3 mg/dL   GFR, Estimated >60 >60 mL/min    Comment: (NOTE) Calculated using the CKD-EPI Creatinine Equation (2021)    Anion gap 13 5 - 15    Comment: Performed at [facility], [address] [place], [place] [address]  CBC     Status:  Abnormal   Collection Time: 06/23/20  4:43 AM  Result Value Ref Range   WBC 22.6 (H) 4.0 - 10.5 K/uL   RBC 3.50 (L) 4.22 - 5.81 MIL/uL   Hemoglobin 10.5 (L) 13.0 - 17.0 g/dL   HCT 33.1 (L) 39.0 - 52.0 %   MCV 94.6 80.0 - 100.0 fL   MCH 30.0 26.0 - 34.0 pg   MCHC 31.7 30.0 - 36.0 g/dL   RDW 13.2 11.5 - 15.5 %   Platelets 882 (H) 150 - 400 K/uL   nRBC 0.1 0.0 - 0.2 %    Comment: Performed at [facility], [address] [place], [place] [address]  Glucose, capillary     Status: Abnormal   Collection Time: 06/23/20  7:25 AM  Result Value Ref Range   Glucose-Capillary 109 (H) 70 - 99 mg/dL    Comment: Glucose reference range applies only to samples taken after fasting for at least 8 hours.  Glucose, capillary     Status: None  Collection Time: 06/23/20 11:16 AM  Result Value Ref Range   Glucose-Capillary 96 70 - 99 mg/dL    Comment: Glucose reference range applies only to samples taken after fasting for at least 8 hours.  Glucose, capillary     Status: Abnormal   Collection Time: 06/23/20  4:10 PM  Result Value Ref Range   Glucose-Capillary 104 (H) 70 - 99 mg/dL    Comment: Glucose reference range applies only to samples taken after fasting for at least 8 hours.    Current Facility-Administered Medications  Medication Dose Route Frequency Provider Last Rate Last Admin  . acetaminophen (TYLENOL) 160 MG/5ML solution 650 mg  650 mg Per Tube Q6H PRN Flora Lipps, MD   650 mg at 06/23/20 0935  . Ampicillin-Sulbactam (UNASYN) 3 g in sodium chloride 0.9 % 100 mL IVPB  3 g Intravenous Q6H Ravishankar, Joellyn Quails, MD      . Derrill Memo ON 06/24/2020] anidulafungin (ERAXIS) 100 mg in sodium chloride 0.9 % 100 mL IVPB  100 mg Intravenous Q24H Dallie Piles, RPH      . chlordiazePOXIDE (LIBRIUM) capsule 25 mg  25 mg Oral Q6H PRN Dallie Piles, RPH   25 mg at 06/22/20 1258  . chlorhexidine gluconate (MEDLINE KIT) (PERIDEX) 0.12 % solution 15 mL  15 mL Mouth Rinse BID Flora Lipps, MD    15 mL at 06/21/20 1922  . Chlorhexidine Gluconate Cloth 2 % PADS 6 each  6 each Topical Daily Marcelyn Bruins, MD   6 each at 06/23/20 [phone]  . clonazePAM (KLONOPIN) disintegrating tablet 1 mg  1 mg Oral BID Tyler Pita, MD   1 mg at 06/23/20 1052  . cloNIDine (CATAPRES - Dosed in mg/24 hr) patch 0.3 mg  0.3 mg Transdermal Weekly Ottie Glazier, MD   0.3 mg at 06/21/20 1649  . diltiazem (CARDIZEM) 125 mg in dextrose 5% 125 mL (1 mg/mL) infusion  5-15 mg/hr Intravenous Titrated Tyler Pita, MD 15 mL/hr at 06/23/20 1754 15 mg/hr at 06/23/20 1754  . docusate (COLACE) 50 MG/5ML liquid 100 mg  100 mg Per Tube BID Darel Hong D, NP   100 mg at 06/22/20 1233  . enoxaparin (LOVENOX) injection 52.5 mg  0.5 mg/kg Subcutaneous Q24H Flora Lipps, MD   52.5 mg at 06/23/20 1053  . feeding supplement (ENSURE ENLIVE / ENSURE PLUS) liquid 237 mL  237 mL Oral TID BM Fritzi Mandes, MD   237 mL at 06/23/20 1411  . folic acid injection 1 mg  1 mg Intravenous Daily Nolberto Hanlon, MD   1 mg at 06/23/20 1053  . free water 30 mL  30 mL Per Tube Q4H Aleskerov, Fuad, MD   30 mL at 06/22/20 1543  . hydrALAZINE (APRESOLINE) injection 10-20 mg  10-20 mg Intravenous Q4H PRN Awilda Bill, NP   20 mg at 06/23/20 0256  . hydrOXYzine (ATARAX/VISTARIL) tablet 25 mg  25 mg Oral Q6H PRN Dallie Piles, RPH   25 mg at 06/22/20 2226  . lipase/protease/amylase (CREON) capsule 24,000 Units  24,000 Units Oral TID AC Tyler Pita, MD   24,000 Units at 06/23/20 1729  . magnesium sulfate IVPB 1 g 100 mL  1 g Intravenous Once Tyler Pita, MD      . MEDLINE mouth rinse  15 mL Mouth Rinse 10 times per day Flora Lipps, MD   15 mL at 06/23/20 1054  . metoCLOPramide (REGLAN) injection 5 mg  5 mg Intravenous Q6H PRN Posey Pronto,  Sona, MD      . metoprolol tartrate (LOPRESSOR) injection 5 mg  5 mg Intravenous Q6H PRN Ottie Glazier, MD   5 mg at 06/23/20 1710  . metoprolol tartrate (LOPRESSOR) tablet 25 mg  25 mg Oral  TID Fritzi Mandes, MD      . multivitamin with minerals tablet 1 tablet  1 tablet Oral Daily Marcelyn Bruins, MD   1 tablet at 06/23/20 1052  . ondansetron (ZOFRAN) injection 4 mg  4 mg Intravenous Q6H PRN Shawna Clamp, MD   4 mg at 06/23/20 0935  . pantoprazole (PROTONIX) EC tablet 40 mg  40 mg Oral Daily Fritzi Mandes, MD      . sodium chloride flush (NS) 0.9 % injection 10-40 mL  10-40 mL Intracatheter Q12H Nolberto Hanlon, MD   10 mL at 06/23/20 1116  . sodium chloride flush (NS) 0.9 % injection 10-40 mL  10-40 mL Intracatheter PRN Nolberto Hanlon, MD   10 mL at 06/22/20 2138  . sodium chloride flush (NS) 0.9 % injection 3 mL  3 mL Intravenous Q12H Marcelyn Bruins, MD   3 mL at 06/23/20 1117  . sodium chloride flush (NS) 0.9 % injection 5 mL  5 mL Intracatheter Q8H Sandi Mariscal, MD   5 mL at 06/23/20 1410  . sodium phosphate (FLEET) 7-19 GM/118ML enema 1 enema  1 enema Rectal Daily PRN Tyna Jaksch, MD      . thiamine tablet 100 mg  100 mg Oral Daily Marcelyn Bruins, MD   100 mg at 06/23/20 1104   Or  . thiamine (B-1) injection 100 mg  100 mg Intravenous Daily Marcelyn Bruins, MD   100 mg at 06/22/20 0820    Musculoskeletal: Strength & Muscle Tone: within normal limits Gait & Station: unable to stand Patient leans: N/A            Psychiatric Specialty Exam:  Presentation  General Appearance: No data recorded Eye Contact:No data recorded Speech:No data recorded Speech Volume:No data recorded Handedness:No data recorded  Mood and Affect  Mood:No data recorded Affect:No data recorded  Thought Process  Thought Processes:No data recorded Descriptions of Associations:No data recorded Orientation:No data recorded Thought Content:No data recorded History of Schizophrenia/Schizoaffective disorder:No data recorded Duration of Psychotic Symptoms:No data recorded Hallucinations:No data recorded Ideas of Reference:No data recorded Suicidal Thoughts:No data  recorded Homicidal Thoughts:No data recorded  Sensorium  Memory:No data recorded Judgment:No data recorded Insight:No data recorded  Executive Functions  Concentration:No data recorded Attention Span:No data recorded Recall:No data recorded Fund of Knowledge:No data recorded Language:No data recorded  Psychomotor Activity  Psychomotor Activity:No data recorded  Assets  Assets:No data recorded  Sleep  Sleep:No data recorded  Physical Exam: Physical Exam Vitals and nursing note reviewed.  Constitutional:      Appearance: Normal appearance.  HENT:     Head: Normocephalic and atraumatic.     Mouth/Throat:     Pharynx: Oropharynx is clear.  Eyes:     Pupils: Pupils are equal, round, and reactive to light.  Cardiovascular:     Rate and Rhythm: Normal rate and regular rhythm.  Pulmonary:     Effort: Pulmonary effort is normal.     Breath sounds: Normal breath sounds.  Abdominal:     General: Abdomen is flat.     Palpations: Abdomen is soft.  Musculoskeletal:        General: Normal range of motion.       Arms:  Skin:  General: Skin is warm and dry.  Neurological:     General: No focal deficit present.     Mental Status: He is alert. Mental status is at baseline.  Psychiatric:        Attention and Perception: He is inattentive.        Mood and Affect: Affect is blunt.        Speech: Speech is delayed.        Behavior: Behavior is slowed.        Thought Content: Thought content normal. Thought content does not include homicidal or suicidal ideation.        Cognition and Memory: Memory is impaired.    Review of Systems  Constitutional: Negative.   HENT: Negative.   Eyes: Negative.   Respiratory: Negative.   Cardiovascular: Negative.   Gastrointestinal: Negative.   Musculoskeletal: Positive for joint pain.  Skin: Negative.   Neurological: Negative.   Psychiatric/Behavioral: Positive for depression, memory loss and substance abuse. Negative for  hallucinations and suicidal ideas. The patient is nervous/anxious and has insomnia.    Blood pressure (!) 160/100, pulse (!) 127, temperature 99.2 F (37.3 C), temperature source Axillary, resp. rate (!) 26, height _0  (1.651 m), weight 98.9 kg, SpO2 94 %. Body mass index is 36.28 kg/m.  Treatment Plan Summary: Medication management and Plan 30 year old man with alcohol abuse cuffs coming out of withdrawal.  History of depression.  Currently does not appear to be delirious not suicidal not psychotic not agitated.  He did have a complain to me of feeling like he could not sleep which of course would be expected in the intensive care unit.  I suggest starting 15 mg of mirtazapine at night for sleep depression and anxiety.  I expect from his current condition that he will probably be in the hospital over the weekend.  Ultimately he will need follow-up with [facility] at discharge.  Supportive counseling and encouragement done.  Disposition: No evidence of imminent risk to self or others at present.   Patient does not meet criteria for psychiatric inpatient admission. Supportive therapy provided about ongoing stressors.  Alethia Berthold, MD 06/23/2020 6:41 PM

## 2020-06-23 NOTE — Progress Notes (Signed)
Pt scored a 4 on the Rt assessment., because of this, the nebulizer treatments are d/c.

## 2020-06-24 LAB — CBC
HCT: 30.9 % — ABNORMAL LOW (ref 39.0–52.0)
Hemoglobin: 9.6 g/dL — ABNORMAL LOW (ref 13.0–17.0)
MCH: 29.5 pg (ref 26.0–34.0)
MCHC: 31.1 g/dL (ref 30.0–36.0)
MCV: 95.1 fL (ref 80.0–100.0)
Platelets: 743 10*3/uL — ABNORMAL HIGH (ref 150–400)
RBC: 3.25 MIL/uL — ABNORMAL LOW (ref 4.22–5.81)
RDW: 13.3 % (ref 11.5–15.5)
WBC: 16 10*3/uL — ABNORMAL HIGH (ref 4.0–10.5)
nRBC: 0.1 % (ref 0.0–0.2)

## 2020-06-24 LAB — BASIC METABOLIC PANEL
Anion gap: 8 (ref 5–15)
BUN: 15 mg/dL (ref 6–20)
CO2: 30 mmol/L (ref 22–32)
Calcium: 8.6 mg/dL — ABNORMAL LOW (ref 8.9–10.3)
Chloride: 106 mmol/L (ref 98–111)
Creatinine, Ser: 1.16 mg/dL (ref 0.61–1.24)
GFR, Estimated: >60 mL/min (ref >60)
Glucose, Bld: 146 mg/dL — ABNORMAL HIGH (ref 70–99)
Potassium: 3.9 mmol/L (ref 3.5–5.1)
Sodium: 144 mmol/L (ref 135–145)

## 2020-06-24 LAB — GLUCOSE, CAPILLARY
Glucose-Capillary: 124 mg/dL — ABNORMAL HIGH (ref 70–99)
Glucose-Capillary: 88 mg/dL (ref 70–99)
Glucose-Capillary: 97 mg/dL (ref 70–99)

## 2020-06-24 LAB — MAGNESIUM: Magnesium: 2.5 mg/dL — ABNORMAL HIGH (ref 1.7–2.4)

## 2020-06-24 LAB — PHOSPHORUS: Phosphorus: 4.1 mg/dL (ref 2.5–4.6)

## 2020-06-24 LAB — LIPASE, BLOOD: Lipase: 46 U/L (ref 11–51)

## 2020-06-24 MED ORDER — ACETAMINOPHEN 325 MG PO TABS
650.0000 mg | ORAL_TABLET | Freq: Four times a day (QID) | ORAL | Status: DC | PRN
  Filled 2020-06-24: qty 2

## 2020-06-24 MED ORDER — DILTIAZEM HCL 30 MG PO TABS
60.0000 mg | ORAL_TABLET | Freq: Three times a day (TID) | ORAL | Status: DC
  Administered 2020-06-24 – 2020-06-26 (×7): 60 mg via ORAL
  Filled 2020-06-24 (×8): qty 2

## 2020-06-24 MED ORDER — FOLIC ACID 1 MG PO TABS
1.0000 mg | ORAL_TABLET | Freq: Every day | ORAL | Status: DC
  Administered 2020-06-24 – 2020-06-26 (×3): 1 mg via ORAL
  Filled 2020-06-24 (×3): qty 1

## 2020-06-24 MED ORDER — BLISTEX MEDICATED EX OINT
TOPICAL_OINTMENT | CUTANEOUS | Status: DC | PRN
  Filled 2020-06-24: qty 6.3

## 2020-06-24 MED ORDER — DOCUSATE SODIUM 50 MG/5ML PO LIQD
100.0000 mg | Freq: Two times a day (BID) | ORAL | Status: DC
  Administered 2020-06-25: 100 mg via ORAL
  Filled 2020-06-24 (×5): qty 10

## 2020-06-24 NOTE — Progress Notes (Signed)
[facility]  - [place] at [facility]   PATIENT NAME: Marcus Small    MR#:  [id number]  DATE OF BIRTH:  1991/02/06  SUBJECTIVE:   Awake and alert. Extubated two days ago.   Sister int he room No fever for 24 hours Tolerating po diet  Sister in the room REVIEW OF SYSTEMS:   Review of Systems  Constitutional: Positive for malaise/fatigue. Negative for chills, fever and weight loss.  HENT: Negative for ear discharge, ear pain and nosebleeds.   Eyes: Negative for blurred vision, pain and discharge.  Respiratory: Negative for sputum production, shortness of breath, wheezing and stridor.   Cardiovascular: Negative for chest pain, palpitations, orthopnea and PND.  Gastrointestinal: Positive for abdominal pain. Negative for diarrhea, nausea and vomiting.  Genitourinary: Negative for frequency and urgency.  Musculoskeletal: Negative for back pain and joint pain.  Neurological: Positive for weakness. Negative for sensory change, speech change and focal weakness.  Psychiatric/Behavioral: Negative for depression and hallucinations. The patient is not nervous/anxious.    Tolerating Diet:yes Tolerating PT:   DRUG ALLERGIES:   Allergies  Allergen Reactions  . Milk-Related Compounds Diarrhea  . Peanut-Containing Drug Products Itching  . Shellfish Allergy     VITALS:  Blood pressure (!) 167/112, pulse (!) 102, temperature 99.9 F (37.7 C), temperature source Oral, resp. rate (!) 25, height 5\' 5"  (1.651 m), weight 98.9 kg, SpO2 97 %.  PHYSICAL EXAMINATION:   Physical Exam  GENERAL:  30 y.o.-year-old patient lying in the bed with no acute distress.  LUNGS: Normal breath sounds bilaterally, no wheezing, rales, rhonchi. No use of accessory muscles of respiration.  CARDIOVASCULAR: S1, S2 normal. No murmurs, rubs, or gallops. tachycardia ABDOMEN: Soft, + tender, nondistended. Bowel sounds present. EXTREMITIES: No cyanosis, clubbing or edema b/l.   PICC+right  arm NEUROLOGIC: Cranial nerves II through XII are intact. No focal Motor or sensory deficits b/l.   PSYCHIATRIC:  patient is alert and oriented x 2 SKIN: No obvious rash, lesion, or ulcer.   LABORATORY PANEL:  CBC Recent Labs  Lab 06/24/20 0400  WBC 16.0*  HGB 9.6*  HCT 30.9*  PLT 743*    Chemistries  Recent Labs  Lab 06/18/20 0400 06/19/20 0359 06/24/20 0400  NA 144   < > 144  K 3.2*   < > 3.9  CL 111   < > 106  CO2 25   < > 30  GLUCOSE 166*   < > 146*  BUN 8   < > 15  CREATININE 1.73*   < > 1.16  CALCIUM 8.3*   < > 8.6*  MG 1.8   < > 2.5*  AST 36  --   --   ALT 22  --   --   ALKPHOS 110  --   --   BILITOT 1.4*  --   --    < > = values in this interval not displayed.   Cardiac Enzymes No results for input(s): TROPONINI in the last 168 hours. RADIOLOGY:  DG Chest Port 1 View  Result Date: 06/23/2020 CLINICAL DATA:  Fever.  Delirium tremens. EXAM: PORTABLE CHEST 1 VIEW COMPARISON:  06/21/2020 FINDINGS: The right PICC line is stable. The feeding tube has been removed. The cardiac silhouette, mediastinal and hilar contours are within normal limits and stable. No acute pulmonary findings. No pleural effusions. No pneumothorax. IMPRESSION: No acute cardiopulmonary findings. Electronically Signed   By: 08/21/2020 M.D.   On: 06/23/2020 14:53   ASSESSMENT AND  PLAN:  Marcus Small is a 30 y.o. male with medical history significant of alcohol use, anxiety, hypertension who presents following seizure at home. Patient's family reports that he had a tonic-clonic seizure that lasted for about 1 minute.  Patient has a history of heavy alcohol use  Severe DT's with heavy Alcohol abuse Alcohol withdrawal Alcohol withdrawal seizures -- patient was intubated on 06/14/20 due to increased work of breathing and severe DTs. He was extubated 06/21/20 -- CT head and C-spine were without acute abnormalities --No history of seizures, presentation consistent with alcohol withdrawal  seizure. -- no signs of withdrawal.scoring "0" on CIWA  Acute severe Pancreatitis  Leukocytosis --Patient with evidence of pancreatitis on CT and lipase elevated to 629 in the setting of known heavy alcohol use also admitted for withdrawal and withdrawal seizure as above. -- Patient underwent drainage of fluid collection around the pancreas. No growth so far. -- Infectious disease consultation appreciated-- currently on Unasyn and Anidulafungin (antifungal) --BC negative so far --cont Pancrealipase --wbc 12K--18K--22K--16K  AKI Hypokalemia AGMA -- pharmacy to replete electrolyte's  Left shoulder dislocation and fracture -- This occurred before the witnessed seizure, possibility that he had an unwitnessed 1 prior does not recall trauma --- conservative management with sling --pt to f/u Dr Learta Codding Purvi Ruehl as out pt  Tachycardia Troponin elevation -- Initial high-sensitivity troponin elevated to 93 in the setting of seizure and tachycardia, suspect demand ischemia. --- IV diltiazem gtt to be weaned off --Start po cardizem 60 mg tid, cont metoprolol 25 mg tid  malignant hypertension -- clonidine,metoprolol. cardizem -- PRN hydralazine  Depression -- on clonazepam -- Dr Toni Amend input appreciated. Added Remeron  Family communication :sister in the room Consults : ID,PCCM CODE STATUS: full DVT Prophylaxis :lovenox Level of care: Progressive Cardiac Status is: Inpatient  Remains inpatient appropriate because:Inpatient level of care appropriate due to severity of illness   Dispo: The patient is from: Home              Anticipated d/c is to: Home              Patient currently is not medically stable to d/c.   Difficult to place patient No  Transfer to PCU      TOTAL TIME TAKING CARE OF THIS PATIENT: 25 minutes.  >50% time spent on counselling and coordination of care  Note: This dictation was prepared with Dragon dictation along with smaller phrase technology. Any  transcriptional errors that result from this process are unintentional.  Enedina Finner M.D    [facility]   CC: Primary care physician; Patient, No Pcp PerPatient ID: Marcus Small, male   DOB: [birth year], 30 y.o.   MRN: [id number]

## 2020-06-24 NOTE — Progress Notes (Signed)
Pt transferred to 2A-241. Report given to [facility] .

## 2020-06-24 NOTE — Evaluation (Signed)
Physical Therapy Evaluation Patient Details Name: Marcus Small MRN: [id number] DOB: 04/07/1991 Today's Date: 06/24/2020   History of Present Illness  Marcus Small is a 29yoM who comes to [facility] 2/27 after seizure at home. PMH: depression, SI, HTN, GAD. Per chart, prior heavy ETOH use, ceased 2 days prior. CT revealing of acute pancreatitis, xray shows left shoulder anteroinferior dislocation and fracture of greater tuberosity (Dr. Signa Kell consulted in ED.) Dr York Cerise performed shoulder closed reduction and placed patient in shoulder immobilizer. Reduction shown on subsequent CT scan, additionally 'comminuted impacted Hill-Sachs deformity of the posterolateral corner involving the anterior bicipital groove.' Dr. Eliane Decree note indicates NWB and use of sling and 2week FU as an outpatient. Pt intubated 3/2-3/9.  Clinical Impression  Pt admitted with above diagnosis. Pt currently with functional limitations due to the deficits listed below (see "PT Problem List"). Upon entry, pt in bed, awake and agreeable to participate. Sister Marcus Small at bedside. The pt is alert, pleasant, interactive, and able to provide info regarding prior level of function, both in tolerance and independence. Pt recently fought his way out of Left shoulder sling, [name] explains prior dislocation, fracture, and MD orders for sling and NWB- this all seems like new information to the patient. Supervision to EOB, pt does well with independent sitting, has persistent but diminishing dizziness over 4-5 minutes. Pt tolerates seated leg exercise without LOB. MinA to rise to standing, performed 3x for exercise, then 1x SPT to recliner with minA. Pt has unsuprising weakness of legs given his prolonged admission, first time up, but he is relentless and motivated. No AMB attempted this date, largely due to space constraints, lines, and weakness of legs. Patient's performance this date reveals decreased ability, independence, and tolerance in  performing all basic mobility required for performance of activities of daily living. Pt requires additional DME, close physical assistance, and cues for safe participate in mobility. Pt will benefit from skilled PT intervention to increase independence and safety with basic mobility in preparation for discharge to the venue listed below.       Follow Up Recommendations CIR;Supervision for mobility/OOB;Supervision - Intermittent    Equipment Recommendations  Other (comment) (not sure yet)    Recommendations for Other Services       Precautions / Restrictions Precautions Precautions: Fall Required Braces or Orthoses: Sling Restrictions Weight Bearing Restrictions: Yes LUE Weight Bearing: Non weight bearing      Mobility  Bed Mobility Overal bed mobility: Needs Assistance Bed Mobility: Supine to Sit     Supine to sit: Supervision;HOB elevated          Transfers Overall transfer level: Needs assistance Equipment used: 1 person hand held assist Transfers: Sit to/from [employer] Sit to Stand: Min assist Stand pivot transfers: Min assist       General transfer comment: requires heavy effort, voices surprising weakness of legs  Ambulation/Gait Ambulation/Gait assistance:  (did not attempt; first day OOB in >7 days, legs weak)              Stairs            Wheelchair Mobility    Modified Rankin (Stroke Patients Only)       Balance Overall balance assessment: Needs assistance Sitting-balance support: No upper extremity supported;Feet supported Sitting balance-Leahy Scale: Normal     Standing balance support: Single extremity supported;During functional activity Standing balance-Leahy Scale: Poor  Pertinent Vitals/Pain Pain Assessment: Faces Faces Pain Scale: Hurts a little bit Pain Location: Left shoulder Pain Intervention(s): Monitored during session    Home Living Family/patient  expects to be discharged to:: Private residence Living Arrangements: Alone (his brother) Available Help at Discharge: Family (Likely DC to mom's house with help from sister Marcus Small) Type of Home: House Home Access: Level entry     Home Layout: One level Home Equipment: None      Prior Function Level of Independence: Independent         Comments: fully independent     Hand Dominance        Extremity/Trunk Assessment                Communication      Cognition Arousal/Alertness: Awake/alert Behavior During Therapy: WFL for tasks assessed/performed Overall Cognitive Status: Within Functional Limits for tasks assessed                                        General Comments      Exercises General Exercises - Lower Extremity Long Arc Quad: AROM;Both;10 reps;Seated Hip Flexion/Marching: AROM;Both;5 reps;Seated Heel Raises: AROM;Both;15 reps;Seated Other Exercises Other Exercises: STS from EOB x3, standing x10-15 seconds Other Exercises: Pt/family education on Left shoulder precautions, sling donning, fist pumps and pancakes   Assessment/Plan    PT Assessment    PT Problem List         PT Treatment Interventions      PT Goals (Current goals can be found in the Care Plan section)  Acute Rehab PT Goals Patient Stated Goal: regain baseline level of independnet mobility PT Goal Formulation: With patient Time For Goal Achievement: 07/08/20 Potential to Achieve Goals: Good    Frequency Min 3X/week (attempting CIR placement)   Barriers to discharge        Co-evaluation               AM-PAC PT "6 Clicks" Mobility  Outcome Measure Help needed turning from your back to your side while in a flat bed without using bedrails?: A Little Help needed moving from lying on your back to sitting on the side of a flat bed without using bedrails?: A Little Help needed moving to and from a bed to a chair (including a wheelchair)?: A Lot Help  needed standing up from a chair using your arms (e.g., wheelchair or bedside chair)?: A Lot Help needed to walk in hospital room?: Total Help needed climbing 3-5 steps with a railing? : Total 6 Click Score: 12    End of Session   Activity Tolerance: Patient tolerated treatment well;Patient limited by fatigue Patient left: in chair;with family/visitor present;with call bell/phone within reach;with nursing/sitter in room Nurse Communication: Mobility status PT Visit Diagnosis: Difficulty in walking, not elsewhere classified (R26.2);Unsteadiness on feet (R26.81);Other abnormalities of gait and mobility (R26.89)    Time: 3846-6599 PT Time Calculation (min) (ACUTE ONLY): 28 min   Charges:   PT Evaluation $PT Eval High Complexity: 1 High PT Treatments $Therapeutic Exercise: 8-22 mins        12:49 PM, 06/24/20 Rosamaria Lints, PT, DPT Physical Therapist - [facility]  [phone] (ASCOM)    Buccola,Allan C 06/24/2020, 12:44 PM

## 2020-06-24 NOTE — Progress Notes (Signed)
Received [place], Charity fundraiser. Will continue to monitor.

## 2020-06-24 NOTE — Plan of Care (Signed)

## 2020-06-24 NOTE — Evaluation (Signed)
Occupational Therapy Evaluation Patient Details Name: Marcus Small MRN: [id number] DOB: 1991-01-22 Today's Date: 06/24/2020    History of Present Illness Marcus Small is a 29yoM who comes to [facility] 2/27 after seizure at home. PMH: depression, SI, HTN, GAD. Per chart, prior heavy ETOH use, ceased 2 days prior. CT revealing of acute pancreatitis, xray shows left shoulder anteroinferior dislocation and fracture of greater tuberosity (Dr. Signa Kell consulted in ED.) Dr York Cerise performed shoulder closed reduction and placed patient in shoulder immobilizer. Reduction shown on subsequent CT scan, additionally 'comminuted impacted Hill-Sachs deformity of the posterolateral corner involving the anterior bicipital groove.' Dr. Eliane Decree note indicates NWB and use of sling and 2week FU as an outpatient. Pt intubated 3/2-3/9.   Clinical Impression   Pt presents today with generalized weakness and severely reduced endurance, limiting his ability to engage in functional mobility tasks. Prior to hospitalization, pt was IND in all ADL/IADL. During today's evaluation, he reports 7/10 pain in L shoulder, is able to engage in bed mobility with Min A, requires Mod A for toileting (bedpan) and hygiene. Attempts but is unable to engage in bridging. Recommend ongoing OT while hospitalized to assist pt in regaining strength, endurance. Given pt's youth, PLOF, and excellent rehab potential, recommend CIR post D/C.      Follow Up Recommendations  CIR    Equipment Recommendations  None recommended by OT    Recommendations for Other Services Other (comment) (info/support for sobriety)     Precautions / Restrictions Precautions Precautions: Fall Required Braces or Orthoses: Sling Restrictions Weight Bearing Restrictions: Yes LUE Weight Bearing: Non weight bearing      Mobility Bed Mobility Overal bed mobility: Needs Assistance Bed Mobility: Supine to Sit;Rolling Rolling: Min assist   Supine to sit: HOB  elevated;Min assist          Transfers                 General transfer comment: not attempted    Balance Overall balance assessment: Needs assistance Sitting-balance support: No upper extremity supported;Feet supported Sitting balance-Leahy Scale: Good         Standing balance comment: not attempted 2/2 fatigue                           ADL either performed or assessed with clinical judgement   ADL Overall ADL's : Needs assistance/impaired Eating/Feeding: Supervision/ safety Eating/Feeding Details (indicate cue type and reason): reports having very little appetite Grooming: Wash/dry hands;Supervision/safety   Upper Body Bathing: Supervision/ safety   Lower Body Bathing: Moderate assistance   Upper Body Dressing : Supervision/safety           Toileting- Clothing Manipulation and Hygiene: Moderate assistance Toileting - Clothing Manipulation Details (indicate cue type and reason): using bed pan, pt able to assist with peri-hygiene             Vision Baseline Vision/History: No visual deficits Patient Visual Report: No change from baseline       Perception     Praxis      Pertinent Vitals/Pain Pain Score: 7  Pain Location: Left shoulder Pain Intervention(s): Limited activity within patient's tolerance;Monitored during session     Hand Dominance     Extremity/Trunk Assessment Upper Extremity Assessment Upper Extremity Assessment: Generalized weakness;LUE deficits/detail LUE: Unable to fully assess due to pain LUE Sensation: WNL   Lower Extremity Assessment Lower Extremity Assessment: Generalized weakness       Communication Communication  Communication: No difficulties   Cognition Arousal/Alertness: Awake/alert Behavior During Therapy: WFL for tasks assessed/performed Overall Cognitive Status: Within Functional Limits for tasks assessed                                     General Comments        Exercises Other Exercises Other Exercises: pt educ re: bed-level therex, rehab process, harm risk reduction   Shoulder Instructions      Home Living Family/patient expects to be discharged to:: Private residence Living Arrangements: Other relatives Available Help at Discharge: Family Type of Home: House Home Access: Stairs to enter Secretary/administrator of Steps: 6 Entrance Stairs-Rails: Can reach both;Left;Right Home Layout: One level     Bathroom Shower/Tub: Tub/shower unit         Home Equipment: None          Prior Functioning/Environment Level of Independence: Independent                 OT Problem List: Decreased strength;Decreased range of motion;Decreased activity tolerance;Impaired balance (sitting and/or standing);Pain      OT Treatment/Interventions: Self-care/ADL training;Therapeutic exercise;Patient/family education;Balance training;Therapeutic activities    OT Goals(Current goals can be found in the care plan section) Acute Rehab OT Goals Patient Stated Goal: be healthy again, not drink alcohol OT Goal Formulation: With patient Time For Goal Achievement: 07/08/20 Potential to Achieve Goals: Good ADL Goals Pt Will Perform Grooming: with supervision;standing (standing at sink for 5+ minutes) Pt Will Perform Lower Body Bathing: with supervision;sit to/from stand Pt Will Perform Lower Body Dressing: sit to/from stand;with supervision Pt Will Transfer to Toilet: with supervision;stand pivot transfer Pt/caregiver will Perform Home Exercise Program: Increased ROM;Increased strength;Independently (increased endurance)  OT Frequency: Min 3X/week   Barriers to D/C:            Co-evaluation              AM-PAC OT "6 Clicks" Daily Activity     Outcome Measure Help from another person eating meals?: A Little Help from another person taking care of personal grooming?: A Little Help from another person toileting, which includes using toliet,  bedpan, or urinal?: A Lot Help from another person bathing (including washing, rinsing, drying)?: A Lot Help from another person to put on and taking off regular upper body clothing?: A Little Help from another person to put on and taking off regular lower body clothing?: A Lot 6 Click Score: 15   End of Session    Activity Tolerance: Patient tolerated treatment well Patient left: in bed;with call bell/phone within reach  OT Visit Diagnosis: Unsteadiness on feet (R26.81);Muscle weakness (generalized) (M62.81);Pain Pain - Right/Left: Left Pain - part of body: Shoulder                Time: 1456-1535 OT Time Calculation (min): 39 min Charges:  OT General Charges $OT Visit: 1 Visit OT Evaluation $OT Eval Moderate Complexity: 1 Mod OT Treatments $Self Care/Home Management : 38-52 mins   Latina Craver, PhD, MS, OTR/L 06/24/20, 5:39 PM

## 2020-06-25 LAB — GLUCOSE, CAPILLARY
Glucose-Capillary: 107 mg/dL — ABNORMAL HIGH (ref 70–99)
Glucose-Capillary: 108 mg/dL — ABNORMAL HIGH (ref 70–99)
Glucose-Capillary: 114 mg/dL — ABNORMAL HIGH (ref 70–99)
Glucose-Capillary: 115 mg/dL — ABNORMAL HIGH (ref 70–99)
Glucose-Capillary: 90 mg/dL (ref 70–99)

## 2020-06-25 LAB — MAGNESIUM: Magnesium: 2.1 mg/dL (ref 1.7–2.4)

## 2020-06-25 LAB — LIPASE, BLOOD: Lipase: 48 U/L (ref 11–51)

## 2020-06-25 LAB — PHOSPHORUS: Phosphorus: 5.4 mg/dL — ABNORMAL HIGH (ref 2.5–4.6)

## 2020-06-25 NOTE — TOC Progression Note (Addendum)
Transition of Care [facility]) - Progression Note    Patient Details  Name: KORVER GRAYBEAL MRN: [id number] Date of Birth: Feb 20, 1991  [facility] of [facility] CM/SW Contact  Bing Quarry, RN Phone Number: 06/25/2020, 12:40 PM  Clinical Narrative:  06/25/20 1245 Current PT/OT evaluations/notes  recommend CIR placement--noted for this patient on 06/24/20. Rivka Safer (Mother) [phone] main contact. Left message with AC at [facility] CIR at 1254 pm.  Gabriel Cirri RN CM   UPDATE: Dr. Allena Katz called CM re patient is walking in room and PT will revaluate and goal is to dc home vs CIR if possible. She spoke with mother. Pt will need Med Mgmt. Antibiotic therapy route to be determined. 1400 Barbie Bell RN CM  5 pm. CIR is not considering patient due to new PT rec for Outpatient PT and 24/7 supervision at home. EDD a few days. Need to reach out to mother to see how feasible that recommendation is for her and complete a full TOC assessment.     Expected Discharge Plan: Home w Home Health Services Barriers to Discharge: Inadequate or no insurance,Financial Resources,Continued Medical Work up  Expected Discharge Plan and Services Expected Discharge Plan: Home w Home Health Services In-house Referral: Clinical Social Work   Post Acute Care Choice: Home Health Living arrangements for the past 2 months: Single Family Home                                       Social Determinants of Health (SDOH) Interventions    Readmission Risk Interventions No flowsheet data found.

## 2020-06-25 NOTE — Plan of Care (Signed)
?  Problem: Health Behavior/Discharge Planning: ?Goal: Ability to manage health-related needs will improve ?Outcome: Progressing ?  ?Problem: Clinical Measurements: ?Goal: Will remain free from infection ?Outcome: Progressing ?  ?Problem: Clinical Measurements: ?Goal: Respiratory complications will improve ?Outcome: Progressing ?  ?Problem: Clinical Measurements: ?Goal: Cardiovascular complication will be avoided ?Outcome: Progressing ?  ?Problem: Pain Managment: ?Goal: General experience of comfort will improve ?Outcome: Progressing ?  ?Problem: Safety: ?Goal: Ability to remain free from injury will improve ?Outcome: Progressing ?  ?

## 2020-06-25 NOTE — Progress Notes (Signed)
[facility]  - [place] [facility]   PATIENT NAME: Marcus Small    MR#:  [id number]  DATE OF BIRTH:  [birth year]  SUBJECTIVE:   Patient transferred out of ICU. His overall doing well. Heart rate 95 to hundred. Tolerating PO meds. Patient tells me he ambulates from the bed to the bathroom along with his IV pole.  He is asking me when he can go home. Tolerating PO diet. Denies abdominal pain. REVIEW OF SYSTEMS:   Review of Systems  Constitutional: Positive for malaise/fatigue. Negative for chills, fever and weight loss.  HENT: Negative for ear discharge, ear pain and nosebleeds.   Eyes: Negative for blurred vision, pain and discharge.  Respiratory: Negative for sputum production, shortness of breath, wheezing and stridor.   Cardiovascular: Negative for chest pain, palpitations, orthopnea and PND.  Gastrointestinal: Positive for abdominal pain. Negative for diarrhea, nausea and vomiting.  Genitourinary: Negative for frequency and urgency.  Musculoskeletal: Negative for back pain and joint pain.  Neurological: Positive for weakness. Negative for sensory change, speech change and focal weakness.  Psychiatric/Behavioral: Negative for depression and hallucinations. The patient is not nervous/anxious.    Tolerating Diet:yes Tolerating PT: patient tells me he ambulates to the bathroom by himself--confirmed with RN today  DRUG ALLERGIES:   Allergies  Allergen Reactions  . Milk-Related Compounds Diarrhea  . Peanut-Containing Drug Products Itching  . Shellfish Allergy     VITALS:  Blood pressure 128/76, pulse 93, temperature 99.4 F (37.4 C), temperature source Oral, resp. rate 18, height 5\' 5"  (1.651 m), weight 103.8 kg, SpO2 98 %.  PHYSICAL EXAMINATION:   Physical Exam  GENERAL:  30 y.o.-year-old patient lying in the bed with no acute distress.  LUNGS: Normal breath sounds bilaterally, no wheezing, rales, rhonchi. No use of accessory muscles of respiration.   CARDIOVASCULAR: S1, S2 normal. No murmurs, rubs, or gallops. tachycardia ABDOMEN: Soft, + tender, nondistended. Bowel sounds present. EXTREMITIES: No cyanosis, clubbing or edema b/l.   PICC+right arm NEUROLOGIC: Cranial nerves II through XII are intact. No focal Motor or sensory deficits b/l.   PSYCHIATRIC:  patient is alert and oriented x 2 SKIN: No obvious rash, lesion, or ulcer.   LABORATORY PANEL:  CBC Recent Labs  Lab 06/24/20 0400  WBC 16.0*  HGB 9.6*  HCT 30.9*  PLT 743*    Chemistries  Recent Labs  Lab 06/24/20 0400 06/25/20 0604  NA 144  --   K 3.9  --   CL 106  --   CO2 30  --   GLUCOSE 146*  --   BUN 15  --   CREATININE 1.16  --   CALCIUM 8.6*  --   MG 2.5* 2.1   Cardiac Enzymes No results for input(s): TROPONINI in the last 168 hours. RADIOLOGY:  No results found. ASSESSMENT AND PLAN:  ZANDYR BARNHILL is a 30 y.o. male with medical history significant of alcohol use, anxiety, hypertension who presents following seizure at home. Patient's family reports that he had a tonic-clonic seizure that lasted for about 1 minute.  Patient has a history of heavy alcohol use  Severe DT's with heavy Alcohol abuse Alcohol withdrawal Alcohol withdrawal seizures -- patient was intubated on 06/14/20 due to increased work of breathing and severe DTs. He was extubated 06/21/20 -- CT head and C-spine were without acute abnormalities --No history of seizures, presentation consistent with alcohol withdrawal seizure. -- no signs of withdrawal.scoring "0" on CIWA  Acute severe Pancreatitis  Leukocytosis --Patient  with evidence of pancreatitis on CT and lipase elevated to 629 in the setting of known heavy alcohol use also admitted for withdrawal and withdrawal seizure as above. -- Patient underwent drainage of fluid collection around the pancreas. No growth so far. -- Infectious disease consultation appreciated-- currently on Unasyn and Anidulafungin (antifungal)--discuss abx  regimen after revieving CBC on monday --BC negative so far --cont Pancrealipase --wbc 12K--18K--22K--16K--CBC in am  AKI/anion gap metabolic acidosis Hypokalemia -- pharmacy repletee electrolyte's  Left shoulder dislocation and fracture -- This occurred before the witnessed seizure, possibility that he had an unwitnessed 1 prior does not recall trauma --- conservative management with sling --pt to f/u Dr Learta Codding Taelynn Mcelhannon as out pt  Tachycardia Troponin elevation -- Initial high-sensitivity troponin elevated to 93 in the setting of seizure and tachycardia, suspect demand ischemia. --- IV diltiazem gtt to be weaned off todau --Start po cardizem 60 mg tid, cont metoprolol 25 mg tid--HR 90-100  malignant hypertension -- clonidine,metoprolol. cardizem -- PRN hydralazine  Depression -- on clonazepam -- Dr Toni Amend input appreciated. Added Remeron  Family communication :mother on the [place] today Consults : ID,PCCM CODE STATUS: full DVT Prophylaxis :lovenox Level of care: Progressive Cardiac Status is: Inpatient  Remains inpatient appropriate because:Inpatient level of care appropriate due to severity of illness   Dispo: The patient is from: Home              Anticipated d/c is to: Home              Patient currently is not medically stable to d/c.   Difficult to place patient No  Patient to work with physical therapy to determine whether he can go home with home health. Will wean off Cardizem drip today. Check CBC tomorrow for ID to determine course of antibiotic/antifungal treatment. Hoping to discharge in 1 to 2 days. TOC informed     TOTAL TIME TAKING CARE OF THIS PATIENT: 25 minutes.  >50% time spent on counselling and coordination of care  Note: This dictation was prepared with Dragon dictation along with smaller phrase technology. Any transcriptional errors that result from this process are unintentional.  Enedina Finner M.D    [facility]   CC: Primary  care physician; Patient, No Pcp PerPatient ID: MATHEAU ORONA, male   DOB: 09-04-1990, 30 y.o.   MRN: [id number]

## 2020-06-25 NOTE — Progress Notes (Signed)
Physical Therapy Treatment Patient Details Name: Marcus Small MRN: [id number] DOB: [birth year] Today's Date: 06/25/2020    History of Present Illness Marcus Small is a 29yoM who comes to [facility] 2/27 after seizure at home. PMH: depression, SI, HTN, GAD. Per chart, prior heavy ETOH use, ceased 2 days prior. CT revealing of acute pancreatitis, xray shows left shoulder anteroinferior dislocation and fracture of greater tuberosity (Dr. Signa Kell consulted in ED.) Dr York Cerise performed shoulder closed reduction and placed patient in shoulder immobilizer. Reduction shown on subsequent CT scan, additionally 'comminuted impacted Hill-Sachs deformity of the posterolateral corner involving the anterior bicipital groove.' Dr. Eliane Decree note indicates NWB and use of sling and 2week FU as an outpatient. Pt intubated 3/2-3/9.    PT Comments    Today the pt was able to increase ambulation distances and also initiate stair training, but his LE strength deficits are significant which is also affecting his balance. He presents with significant higher level balance deficits evidenced by 5xSTS time of 28.68s (highly predictive of increased fall risk) and DGI score of 10/24 (highly predictive of falls). He had 1 LOB that required Mod A to keep him from falling. Based on how he presented today he would greatly benefit from the intensive rehabilitation, but with his daily improvements he could safely d/c home with 24/7 family supervision and outpatient PT to address balance and strength deficits as well as ROM impairments associated with L shoulder fx.     Follow Up Recommendations  Outpatient PT;Supervision/Assistance - 24 hour     Equipment Recommendations       Recommendations for Other Services       Precautions / Restrictions Precautions Precautions: Fall Restrictions LUE Weight Bearing: Weight bearing as tolerated    Mobility  Bed Mobility Overal bed mobility: Modified Independent (Required verbal  cues to remain NWB on LUE) Bed Mobility: Supine to Sit     Supine to sit: Modified independent (Device/Increase time)     General bed mobility comments: HOB flat to simulate home environment.    Transfers Overall transfer level: Needs assistance Equipment used: None Transfers: Sit to/from Stand (sit<>stand with UE assistance, pt is supervision; without UE assistance pt requires Min A) Sit to Stand: Min assist;Supervision            Ambulation/Gait Ambulation/Gait assistance: Min guard;Mod assist Gait Distance (Feet): 100 Feet (100' longest distance before LOB. Total of ~400' this session.) Assistive device: None Gait Pattern/deviations: Decreased step length - right;Decreased step length - left;Scissoring (intermittent) Gait velocity: Pt presents with slowed gait speeds and requries cueing for increased pace. Gait velocity interpretation: <1.31 ft/sec, indicative of household ambulator General Gait Details: x1 LOB that required Mod A for correction to prevent fall.   Stairs Stairs: Yes Stairs assistance: Min assist Stair Management: One rail Right;Forwards Number of Stairs: 3 General stair comments: Prior to stair training pt presenting with trembling of LE d/t fear of falling.   Wheelchair Mobility    Modified Rankin (Stroke Patients Only)       Balance Overall balance assessment: Needs assistance Sitting-balance support: No upper extremity supported;Feet supported Sitting balance-Leahy Scale: Good     Standing balance support: No upper extremity supported;During functional activity Standing balance-Leahy Scale: Fair               High level balance activites: Head turns;Sudden stops High Level Balance Comments: Pt able to stop suddenly without LOB, however demonstrates significant decrese in gait speed with head turns. Standardized Balance Assessment Standardized  Balance Assessment : Dynamic Gait Index   Dynamic Gait Index Level Surface: Mild  Impairment Change in Gait Speed: Mild Impairment Gait with Horizontal Head Turns: Mild Impairment Gait with Vertical Head Turns: Moderate Impairment Gait and Pivot Turn: Severe Impairment Step Over Obstacle: Moderate Impairment Step Around Obstacles: Moderate Impairment Steps: Moderate Impairment Total Score: 10      Cognition Arousal/Alertness: Awake/alert Behavior During Therapy: WFL for tasks assessed/performed Overall Cognitive Status: Within Functional Limits for tasks assessed                                        Exercises      General Comments        Pertinent Vitals/Pain Pain Assessment: No/denies pain    Home Living                      Prior Function            PT Goals (current goals can now be found in the care plan section) Acute Rehab PT Goals Patient Stated Goal: be healthy again, not drink alcohol PT Goal Formulation: With patient Time For Goal Achievement: 07/08/20 Potential to Achieve Goals: Good Progress towards PT goals: Progressing toward goals    Frequency    Min 3X/week      PT Plan Discharge plan needs to be updated    Co-evaluation              AM-PAC PT "6 Clicks" Mobility   Outcome Measure  Help needed turning from your back to your side while in a flat bed without using bedrails?: None Help needed moving from lying on your back to sitting on the side of a flat bed without using bedrails?: None Help needed moving to and from a bed to a chair (including a wheelchair)?: A Little Help needed standing up from a chair using your arms (e.g., wheelchair or bedside chair)?: A Little Help needed to walk in hospital room?: A Little Help needed climbing 3-5 steps with a railing? : A Little 6 Click Score: 20    End of Session Equipment Utilized During Treatment: Gait belt Activity Tolerance: Patient tolerated treatment well Patient left: in bed;with family/visitor present Nurse Communication: Mobility  status PT Visit Diagnosis: Difficulty in walking, not elsewhere classified (R26.2);Unsteadiness on feet (R26.81);Other abnormalities of gait and mobility (R26.89)     Time: 1530-1600 PT Time Calculation (min) (ACUTE ONLY): 30 min  Charges:  $Gait Training: 23-37 mins                     4:23 PM, 06/25/20 Kahlia Lagunes A. Mordecai Maes PT, DPT Physical Therapist - [facility]    Dennison Mcdaid A Dacoda Finlay 06/25/2020, 4:21 PM

## 2020-06-25 NOTE — Progress Notes (Signed)
Inpatient Rehab Admissions Coordinator Note:   Per OT/PT recommendations, pt was screened for CIR candidacy by Wolfgang Phoenix, MS, CCC-SLP. Noted PT changed recommendation to OP and also noted that pt would prefer to go home.  At this time we are not recommending an inpatient rehab consult.  Please contact me with questions.    Wolfgang Phoenix, MS, CCC-SLP Admissions Coordinator [phone] 06/25/20 4:42 PM

## 2020-06-25 NOTE — Plan of Care (Signed)
  Problem: Health Behavior/Discharge Planning: Goal: Ability to manage health-related needs will improve Outcome: Progressing   Problem: Clinical Measurements: Goal: Ability to maintain clinical measurements within normal limits will improve Outcome: Progressing Goal: Will remain free from infection Outcome: Progressing Goal: Respiratory complications will improve Outcome: Progressing Goal: Cardiovascular complication will be avoided Outcome: Progressing   

## 2020-06-26 DIAGNOSIS — R569 Unspecified convulsions: Secondary | ICD-10-CM

## 2020-06-26 DIAGNOSIS — R7989 Other specified abnormal findings of blood chemistry: Secondary | ICD-10-CM

## 2020-06-26 LAB — CBC
HCT: 27.9 % — ABNORMAL LOW (ref 39.0–52.0)
Hemoglobin: 8.8 g/dL — ABNORMAL LOW (ref 13.0–17.0)
MCH: 29.5 pg (ref 26.0–34.0)
MCHC: 31.5 g/dL (ref 30.0–36.0)
MCV: 93.6 fL (ref 80.0–100.0)
Platelets: 811 10*3/uL — ABNORMAL HIGH (ref 150–400)
RBC: 2.98 MIL/uL — ABNORMAL LOW (ref 4.22–5.81)
RDW: 12.6 % (ref 11.5–15.5)
WBC: 13.4 10*3/uL — ABNORMAL HIGH (ref 4.0–10.5)
nRBC: 0 % (ref 0.0–0.2)

## 2020-06-26 LAB — GLUCOSE, CAPILLARY
Glucose-Capillary: 110 mg/dL — ABNORMAL HIGH (ref 70–99)
Glucose-Capillary: 86 mg/dL (ref 70–99)
Glucose-Capillary: 88 mg/dL (ref 70–99)
Glucose-Capillary: 100 mg/dL — ABNORMAL HIGH (ref 70–99)

## 2020-06-26 LAB — CREATININE, SERUM
Creatinine, Ser: 1.13 mg/dL (ref 0.61–1.24)
GFR, Estimated: >60 mL/min (ref >60)

## 2020-06-26 LAB — PHOSPHORUS: Phosphorus: 7 mg/dL — ABNORMAL HIGH (ref 2.5–4.6)

## 2020-06-26 LAB — LIPASE, BLOOD: Lipase: 50 U/L (ref 11–51)

## 2020-06-26 LAB — MAGNESIUM: Magnesium: 2 mg/dL (ref 1.7–2.4)

## 2020-06-26 MED ORDER — MIRTAZAPINE 15 MG PO TABS: 15.0000 mg | ORAL_TABLET | Freq: Every day | ORAL | 0 refills | Status: AC

## 2020-06-26 MED ORDER — FLUCONAZOLE 200 MG PO TABS: 400.0000 mg | ORAL_TABLET | Freq: Every day | ORAL | 0 refills | Status: AC

## 2020-06-26 MED ORDER — METOPROLOL TARTRATE 25 MG PO TABS: 25.0000 mg | ORAL_TABLET | Freq: Two times a day (BID) | ORAL | 0 refills | Status: AC

## 2020-06-26 MED ORDER — CLONIDINE 0.3 MG/24HR TD PTWK: 0.3000 mg | MEDICATED_PATCH | TRANSDERMAL | 0 refills | Status: AC

## 2020-06-26 MED ORDER — PANCRELIPASE (LIP-PROT-AMYL) 24000-76000 UNITS PO CPEP: 24000.0000 [IU] | ORAL_CAPSULE | Freq: Three times a day (TID) | ORAL | 0 refills | Status: DC

## 2020-06-26 MED ORDER — FLUCONAZOLE 100 MG PO TABS
400.0000 mg | ORAL_TABLET | Freq: Every day | ORAL | Status: DC
  Filled 2020-06-26: qty 4

## 2020-06-26 NOTE — Progress Notes (Signed)
Nursing reports patient can't afford clonidine patch. Will switch him to PO clonidine 0.3 mg po BID. I've called in at [facility] pharmacy.  Nurse is notifying patient.

## 2020-06-26 NOTE — Plan of Care (Signed)

## 2020-06-26 NOTE — Discharge Instructions (Signed)
Alcohol Abuse and Dependence Information, Adult Alcohol is a widely available drug. People drink alcohol in different amounts. People who drink alcohol very often and in large amounts often have problems during and after drinking. They may develop what is called an alcohol use disorder. There are two main types of alcohol use disorders:  Alcohol abuse. This is when you use alcohol too much or too often. You may use alcohol to make yourself feel happy or to reduce stress. You may have a hard time setting a limit on the amount you drink.  Alcohol dependence. This is when you use alcohol consistently for a period of time, and your body changes as a result. This can make it hard to stop drinking because you may start to feel sick or feel different when you do not use alcohol. These symptoms are known as withdrawal. How can alcohol abuse and dependence affect me? Alcohol abuse and dependence can have a negative effect on your life. Drinking too much can lead to addiction. You may feel like you need alcohol to function normally. You may drink alcohol before work in the morning, during the day, or as soon as you get home from work in the evening. These actions can result in:  Poor work performance.  Job loss.  Financial problems.  Car crashes or criminal charges from driving after drinking alcohol.  Problems in your relationships with friends and family.  Losing the trust and respect of coworkers, friends, and family. Drinking heavily over a long period of time can permanently damage your body and brain, and can cause lifelong health issues, such as:  Damage to your liver or pancreas.  Heart problems, high blood pressure, or stroke.  Certain cancers.  Decreased ability to fight infections.  Brain or nerve damage.  Depression.  Early (premature) death. If you are careless or you crave alcohol, it is easy to drink more than your body can handle (overdose). Alcohol overdose is a serious  situation that requires hospitalization. It may lead to permanent injuries or death. What can increase my risk?  Having a family history of alcohol abuse.  Having depression or other mental health conditions.  Beginning to drink at an early age.  Binge drinking often.  Experiencing trauma, stress, and an unstable home life during childhood.  Spending time with people who drink often. What actions can I take to prevent or manage alcohol abuse and dependence?  Do not drink alcohol if: ? Your health care provider tells you not to drink. ? You are pregnant, may be pregnant, or are planning to become pregnant.  If you drink alcohol: ? Limit how much you use to:  0-1 drink a day for women.  0-2 drinks a day for men. ? Be aware of how much alcohol is in your drink. In the [place] one drink equals one 12 oz bottle of beer (355 mL), one 5 oz glass of wine (148 mL), or one 1 oz glass of hard liquor (44 mL).  Stop drinking if you have been drinking too much. This can be very hard to do if you are used to abusing alcohol. If you begin to have withdrawal symptoms, talk with your health care provider or a person that you trust. These symptoms may include anxiety, shaky hands, headache, nausea, sweating, or not being able to sleep.  Choose to drink nonalcoholic beverages in social gatherings and places where there may be alcohol. Activity  Spend more time on activities that you enjoy that do   not involve alcohol, like hobbies or exercise.  Find healthy ways to cope with stress, such as exercise, meditation, or spending time with people you care about. General information  Talk to your family, coworkers, and friends about supporting you in your efforts to stop drinking. If they drink, ask them not to drink around you. Spend more time with people who do not drink alcohol.  If you think that you have an alcohol dependency problem: ? Tell friends or family about your concerns. ? Talk with your  health care provider or another health professional about where to get help. ? Work with a therapist and a chemical dependency counselor. ? Consider joining a support group for people who struggle with alcohol abuse and dependence. Where to find support  Your health care provider.  SMART Recovery: www.smartrecovery.org Therapy and support groups  Local treatment centers or chemical dependency counselors.  Local AA groups in your community: www.aa.org   Where to find more information  Centers for Disease Control and Prevention: www.cdc.gov  National Institute on Alcohol Abuse and Alcoholism: www.niaaa.nih.gov  Alcoholics Anonymous (AA): www.aa.org Contact a health care provider if:  You drank more or for longer than you intended on more than one occasion.  You tried to stop drinking or to cut back on how much you drink, but you were not able to.  You often drink to the point of vomiting or passing out.  You want to drink so badly that you cannot think about anything else.  You have problems in your life due to drinking, but you continue to drink.  You keep drinking even though you feel anxious, depressed, or have experienced memory loss.  You have stopped doing the things you used to enjoy in order to drink.  You have to drink more than you used to in order to get the effect you want.  You experience anxiety, sweating, nausea, shakiness, and trouble sleeping when you try to stop drinking. Get help right away if:  You have thoughts about hurting yourself or others.  You have serious withdrawal symptoms, including: ? Confusion. ? Racing heart. ? High blood pressure. ? Fever. If you ever feel like you may hurt yourself or others, or have thoughts about taking your own life, get help right away. You can go to your nearest emergency department or call:  Your local emergency services (911 in the [place]  A suicide crisis helpline, such as the National Suicide Prevention  Lifeline at 1-800-273-8255. This is open 24 hours a day. Summary  Alcohol abuse and dependence can have a negative effect on your life. Drinking too much or too often can lead to addiction.  If you drink alcohol, limit how much you use.  If you are having trouble keeping your drinking under control, find ways to change your behavior. Hobbies, calming activities, exercise, or support groups can help.  If you feel you need help with changing your drinking habits, talk with your health care provider, a good friend, or a therapist, or go to an AA group. This information is not intended to replace advice given to you by your health care provider. Make sure you discuss any questions you have with your health care provider. Document Revised: 07/21/2018 Document Reviewed: 06/09/2018 Elsevier Patient Education  2021 [employer]  

## 2020-06-26 NOTE — Progress Notes (Signed)
Physical Therapy Treatment Patient Details Name: Marcus Small MRN: [id number] DOB: 12-Nov-1990 Today's Date: 06/26/2020    History of Present Illness Marcus Small is a 29yoM who comes to [facility] 2/27 after seizure at home. PMH: depression, SI, HTN, GAD. Per chart, prior heavy ETOH use, ceased 2 days prior. CT revealing of acute pancreatitis, xray shows left shoulder anteroinferior dislocation and fracture of greater tuberosity (Dr. Signa Kell consulted in ED.) Dr York Cerise performed shoulder closed reduction and placed patient in shoulder immobilizer. Reduction shown on subsequent CT scan, additionally 'comminuted impacted Hill-Sachs deformity of the posterolateral corner involving the anterior bicipital groove.' Dr. Eliane Decree note indicates NWB and use of sling and 2week FU as an outpatient. Pt intubated 3/2-3/9.    PT Comments    Pt sitting on edge of bed upon PT arrival; 2 members of pt's family present.  Pt able to transfer with SBA, ambulate 240 feet with CGA to SBA (no AD use), and navigate 3 steps with R UE use on railing CGA.  Pt steady with step through gait pattern during ambulation.  Mild increased LE effort ascending/descending stairs but pt steady and safe.  Pt able to maintain NWB'ing status L UE during sessions activities.  Educated pt on safety with uneven and different surfaces (for balance) and also pacing/modifying activity as pt builds up activity tolerance.  HR 97 bpm at rest; HR increased to 120's post stairs trial; and HR increased to 130 bpm end of ambulation back to pt's room (HR decreased to 96-98 bpm within a couple minutes of sitting rest break)--nurse notified.  Will continue to focus on strengthening and higher level balance during hospitalization.   Follow Up Recommendations  Outpatient PT;Supervision for mobility/OOB     Equipment Recommendations  None recommended by PT    Recommendations for Other Services       Precautions / Restrictions  Precautions Precautions: Fall Required Braces or Orthoses: Sling Restrictions Weight Bearing Restrictions: Yes LUE Weight Bearing: Non weight bearing    Mobility  Bed Mobility               General bed mobility comments: Deferred (pt sitting on edge of bed upon PT arrival)    Transfers Overall transfer level: Needs assistance Equipment used: None Transfers: Sit to/from Stand Sit to Stand: Supervision         General transfer comment: steady safe transfers noted without UE use  Ambulation/Gait Ambulation/Gait assistance: Min guard;Supervision Gait Distance (Feet): 240 Feet Assistive device: None Gait Pattern/deviations: Step-through pattern     General Gait Details: steady   Stairs Stairs: Yes Stairs assistance: Min guard Stair Management: One rail Right;Forwards Number of Stairs: 3 General stair comments: Mild increased LE effort ascending/descending stairs but steady and safe   Wheelchair Mobility    Modified Rankin (Stroke Patients Only)       Balance Overall balance assessment: Needs assistance Sitting-balance support: No upper extremity supported;Feet supported Sitting balance-Leahy Scale: Normal Sitting balance - Comments: steady sitting reaching outside BOS   Standing balance support: No upper extremity supported;During functional activity Standing balance-Leahy Scale: Good Standing balance comment: no loss of balance with ambulation and looking around in hallways and with turns                            Cognition Arousal/Alertness: Awake/alert Behavior During Therapy: WFL for tasks assessed/performed Overall Cognitive Status: Within Functional Limits for tasks assessed  Exercises      General Comments   Nursing cleared pt for participation in physical therapy.  Pt agreeable to PT session.  No bed alarm in place upon PT arrival.      Pertinent Vitals/Pain Pain  Assessment: No/denies pain Faces Pain Scale: No hurt Pain Intervention(s): Limited activity within patient's tolerance;Monitored during session;Repositioned    Home Living                      Prior Function            PT Goals (current goals can now be found in the care plan section) Acute Rehab PT Goals Patient Stated Goal: to go home PT Goal Formulation: With patient Time For Goal Achievement: 07/08/20 Potential to Achieve Goals: Good Progress towards PT goals: Progressing toward goals    Frequency    Min 2X/week      PT Plan Frequency needs to be updated;Discharge plan needs to be updated    Co-evaluation              AM-PAC PT "6 Clicks" Mobility   Outcome Measure  Help needed turning from your back to your side while in a flat bed without using bedrails?: None Help needed moving from lying on your back to sitting on the side of a flat bed without using bedrails?: None Help needed moving to and from a bed to a chair (including a wheelchair)?: A Little Help needed standing up from a chair using your arms (e.g., wheelchair or bedside chair)?: A Little Help needed to walk in hospital room?: A Little Help needed climbing 3-5 steps with a railing? : A Little 6 Click Score: 20    End of Session Equipment Utilized During Treatment: Gait belt Activity Tolerance: Patient tolerated treatment well Patient left: with call bell/phone within reach;with family/visitor present (sitting on edge of bed with family present) Nurse Communication: Mobility status;Precautions;Other (comment) (pt's elevated HR (but not sustained) end of ambulation) PT Visit Diagnosis: Difficulty in walking, not elsewhere classified (R26.2);Unsteadiness on feet (R26.81);Other abnormalities of gait and mobility (R26.89)     Time: 6195-0932 PT Time Calculation (min) (ACUTE ONLY): 13 min  Charges:  $Gait Training: 8-22 mins                    Hendricks Limes, PT 06/26/20, 12:45  PM

## 2020-06-26 NOTE — Progress Notes (Signed)
Date of Admission:  06/11/2020      ID: Marcus Small is a 30 y.o. male  Principal Problem:   Alcohol use Active Problems:   Alcohol withdrawal seizure ([place])   HTN (hypertension)   Alcohol withdrawal ([place])   Alcoholic pancreatitis   Leukocytosis   AKI (acute kidney injury) ([place])   Hypokalemia   High anion gap metabolic acidosis   Closed dislocation of left shoulder   Elevated troponin   Tachycardia   Acute pancreatitis   Delirium tremens (HCC)   Pressure injury of skin    Subjective: Patient is doing much better Eating solid food No pain abdomen Wanting to go home No fever  Medications:  . chlorhexidine gluconate (MEDLINE KIT)  15 mL Mouth Rinse BID  . Chlorhexidine Gluconate Cloth  6 each Topical Daily  . clonazepam  1 mg Oral BID  . cloNIDine  0.3 mg Transdermal Weekly  . diltiazem  60 mg Oral Q8H  . docusate  100 mg Oral BID  . enoxaparin (LOVENOX) injection  0.5 mg/kg Subcutaneous Q24H  . folic acid  1 mg Oral Daily  . lipase/protease/amylase  24,000 Units Oral TID AC  . metoprolol tartrate  25 mg Oral TID  . mirtazapine  15 mg Oral QHS  . multivitamin with minerals  1 tablet Oral Daily  . pantoprazole  40 mg Oral Daily  . sodium chloride flush  10-40 mL Intracatheter Q12H  . sodium chloride flush  3 mL Intravenous Q12H  . sodium chloride flush  5 mL Intracatheter Q8H  . thiamine  100 mg Oral Daily    Objective: Vital signs in last 24 hours: Temp:  [98.7 F (37.1 C)-100.3 F (37.9 C)] 99.4 F (37.4 C) (03/14 0736) Pulse Rate:  [82-99] 86 (03/14 0736) Resp:  [17-22] 18 (03/14 0736) BP: (128-148)/(72-94) 146/88 (03/14 0736) SpO2:  [95 %-100 %] 100 % (03/14 0736)  PHYSICAL EXAM:  General: Alert, cooperative, no distress, appears stated age.  Head: Normocephalic, without obvious abnormality, atraumatic. Eyes: Conjunctivae clear, anicteric sclerae. Pupils are equal ENT Nares normal. No drainage or sinus tenderness. Lips, mucosa, and tongue  normal. No Thrush Neck: Supple, symmetrical, no adenopathy, thyroid: non tender no carotid bruit and no JVD. Back: No CVA tenderness. Lungs: Clear to auscultation bilaterally. No Wheezing or Rhonchi. No rales. Heart: Regular rate and rhythm, no murmur, rub or gallop. Abdomen: Soft, distended.  JP drain.  Extremities: atraumatic, no cyanosis. No edema. No clubbing Skin: No rashes or lesions. Or bruising Lymph: Cervical, supraclavicular normal. Neurologic: Grossly non-focal  Lab Results Recent Labs    06/23/20 2150 06/24/20 0400 06/26/20 0543  WBC  --  16.0* 13.4*  HGB  --  9.6* 8.8*  HCT  --  30.9* 27.9*  NA  --  144  --   K 3.1* 3.9  --   CL  --  106  --   CO2  --  30  --   BUN  --  15  --   CREATININE  --  1.16 1.13     Assessment/Plan: Acute pancreatitis due to alcohol: Much improved Intra-abdominal collection secondary to the gastrocolic ligament area.  As the JP drain. No fevers Leukocytosis has improved with antifungal treatment.  He is on anidulafungin day 3.  We will switch him to fluconazole for 5 more days.  Acute hypoxic respiratory failure.  Was intubated for a short period of time.  Status post extubation is doing well  Seizures due to alcohol  withdrawal  Discussed the management with the patient and his family at bedside Advised total abstinence from alcohol. JP drain will be removed and then patient will be discharged today

## 2020-06-27 NOTE — Discharge Summary (Signed)
[place] at [facility]   PATIENT NAME: Marcus Small    MR#:  [id number]  DATE OF BIRTH:  [birth year]  DATE OF ADMISSION:  06/11/2020   ADMITTING PHYSICIAN: Cipriano Bunker, MD  DATE OF DISCHARGE: 06/26/2020  3:30 PM  PRIMARY CARE PHYSICIAN: Patient, No Pcp Per   ADMISSION DIAGNOSIS:  Seizure (HCC) [R56.9] Seizures (HCC) [R56.9] Alcohol withdrawal seizure (HCC) [P37.902, R56.9] Shoulder dislocation, left, initial encounter [S43.005A] Alcohol withdrawal syndrome without complication (HCC) [F10.230] Acute pancreatitis, unspecified complication status, unspecified pancreatitis type [K85.90] Sepsis, due to unspecified organism, unspecified whether acute organ dysfunction present (HCC) [A41.9] DISCHARGE DIAGNOSIS:  Principal Problem:   Alcohol use Active Problems:   Alcohol withdrawal seizure (HCC)   HTN (hypertension)   Alcohol withdrawal (HCC)   Alcoholic pancreatitis   Leukocytosis   AKI (acute kidney injury) (HCC)   Hypokalemia   High anion gap metabolic acidosis   Closed dislocation of left shoulder   Elevated troponin   Tachycardia   Acute pancreatitis   Delirium tremens (HCC)   Pressure injury of skin   Seizure (HCC)   Seizures (HCC)  SECONDARY DIAGNOSIS:   Past Medical History:  Diagnosis Date  . Anxiety   . Hypertension    HOSPITAL COURSE:  [name] a 29 y.o.malewith medical history significant ofalcohol use, anxiety, hypertension who presents following seizure at home. Patient's family reports that he had a tonic-clonic seizure that lasted for about 1 minute. Patient has a history of heavy alcohol use  Severe DT's with heavy Alcohol abuse Alcohol withdrawal Alcohol withdrawal seizures -- patient was intubated on 06/14/20 due to increased work of breathing and severe DTs. He was extubated 06/21/20 --CT head and C-spine were without acute abnormalities --No history of seizures, presentation consistent with alcohol withdrawal  seizure. -- no signs of withdrawal.scoring "0" on CIWA  Acute severe Pancreatitis  Leukocytosis --Patient with evidence of pancreatitis on CT and lipase elevated to 629 in the setting of known heavy alcohol use also admitted for withdrawal and withdrawal seizure as above. -- Patient underwent drainage of fluid collection around the pancreas. No growth so far. -- Infectious disease consultation appreciated-- currently on Unasyn and Anidulafungin (antifungal)--discuss abx regimen after revieving CBC on monday --BC negative so far --cont Pancrealipase --wbc 12K--18K--22K--16K--CBC in am  AKI/anion gap metabolic acidosis Hypokalemia -- repleted and resolved  Left shoulder dislocation and fracture --This occurred before the witnessed seizure, possibility that he had an unwitnessed 1 prior does not recall trauma --- conservative management with sling --pt to f/u with Dr Learta Codding patel as out pt - patient opted to F/up on his own and make his own appt  Tachycardia - POA and resolved at DC Troponin elevation -- in the setting of seizure and tachycardia,due to demand ischemia.  malignant hypertension -- resolved  Depression --seen by psych, started and D/C on Remeron  Pressure ulcer - POA as documented below Pressure Injury 06/21/20 Ear Right Stage 2 -  Partial thickness loss of dermis presenting as a shallow open injury with a red, pink wound bed without slough. (Active)  06/21/20 2000  Location: Ear  Location Orientation: Right  Staging: Stage 2 -  Partial thickness loss of dermis presenting as a shallow open injury with a red, pink wound bed without slough.  Wound Description (Comments):   Present on Admission: No   DISCHARGE CONDITIONS:  stable CONSULTS OBTAINED:  Treatment Team:  Clapacs, Jackquline Denmark, MD DRUG ALLERGIES:   Allergies  Allergen Reactions  . Milk-Related  Compounds Diarrhea  . Peanut-Containing Drug Products Itching  . Shellfish Allergy    DISCHARGE  MEDICATIONS:   Allergies as of 06/26/2020      Reactions   Milk-related Compounds Diarrhea   Peanut-containing Drug Products Itching   Shellfish Allergy       Medication List    STOP taking these medications   albuterol 108 (90 Base) MCG/ACT inhaler Commonly known as: VENTOLIN HFA   GOODY HEADACHE PO   ibuprofen 200 MG tablet Commonly known as: ADVIL   ketorolac 10 MG tablet Commonly known as: TORADOL   meloxicam 15 MG tablet Commonly known as: MOBIC   ondansetron 4 MG disintegrating tablet Commonly known as: Zofran ODT     TAKE these medications   cloNIDine 0.3 mg/24hr patch Commonly known as: CATAPRES - Dosed in mg/24 hr Place 1 patch (0.3 mg total) onto the skin once a week. Start taking on: June 28, 2020   fluconazole 200 MG tablet Commonly known as: DIFLUCAN Take 2 tablets (400 mg total) by mouth daily for 5 days.   metoprolol tartrate 25 MG tablet Commonly known as: LOPRESSOR Take 1 tablet (25 mg total) by mouth 2 (two) times daily.   mirtazapine 15 MG tablet Commonly known as: REMERON Take 1 tablet (15 mg total) by mouth at bedtime.   Pancrelipase (Lip-Prot-Amyl) 24000-76000 units Cpep Take 1 capsule (24,000 Units total) by mouth 3 (three) times daily before meals.      DISCHARGE INSTRUCTIONS:   DIET:  Regular diet DISCHARGE CONDITION:  Stable ACTIVITY:  Activity as tolerated OXYGEN:  Home Oxygen: No.  Oxygen Delivery: room air DISCHARGE LOCATION:  home   If you experience worsening of your admission symptoms, develop shortness of breath, life threatening emergency, suicidal or homicidal thoughts you must seek medical attention immediately by calling 911 or calling your MD immediately  if symptoms less severe.  You Must read complete instructions/literature along with all the possible adverse reactions/side effects for all the Medicines you take and that have been prescribed to you. Take any new Medicines after you have completely understood  and accpet all the possible adverse reactions/side effects.   Please note  You were cared for by a hospitalist during your hospital stay. If you have any questions about your discharge medications or the care you received while you were in the hospital after you are discharged, you can call the unit and asked to speak with the hospitalist on call if the hospitalist that took care of you is not available. Once you are discharged, your primary care physician will handle any further medical issues. Please note that NO REFILLS for any discharge medications will be authorized once you are discharged, as it is imperative that you return to your primary care physician (or establish a relationship with a primary care physician if you do not have one) for your aftercare needs so that they can reassess your need for medications and monitor your lab values.    On the day of Discharge:  VITAL SIGNS:  Blood pressure (!) 145/95, pulse 91, temperature 98.3 F (36.8 C), resp. rate 18, height 5\' 5"  (1.651 m), weight 103.8 kg, SpO2 100 %. PHYSICAL EXAMINATION:  GENERAL:  30 y.o.-year-old patient lying in the bed with no acute distress.  EYES: Pupils equal, round, reactive to light and accommodation. No scleral icterus. Extraocular muscles intact.  HEENT: Head atraumatic, normocephalic. Oropharynx and nasopharynx clear.  NECK:  Supple, no jugular venous distention. No thyroid enlargement, no tenderness.  LUNGS:  Normal breath sounds bilaterally, no wheezing, rales,rhonchi or crepitation. No use of accessory muscles of respiration.  CARDIOVASCULAR: S1, S2 normal. No murmurs, rubs, or gallops.  ABDOMEN: Soft, non-tender, non-distended. Bowel sounds present. No organomegaly or mass.  EXTREMITIES: No pedal edema, cyanosis, or clubbing.  NEUROLOGIC: Cranial nerves II through XII are intact. Muscle strength 5/5 in all extremities. Sensation intact. Gait not checked.  PSYCHIATRIC: The patient is alert and oriented x 3.   SKIN: No obvious rash, lesion, or ulcer.  DATA REVIEW:   CBC Recent Labs  Lab 06/26/20 0543  WBC 13.4*  HGB 8.8*  HCT 27.9*  PLT 811*    Chemistries  Recent Labs  Lab 06/24/20 0400 06/25/20 0604 06/26/20 0543  NA 144  --   --   K 3.9  --   --   CL 106  --   --   CO2 30  --   --   GLUCOSE 146*  --   --   BUN 15  --   --   CREATININE 1.16  --  1.13  CALCIUM 8.6*  --   --   MG 2.5*   < > 2.0   < > = values in this interval not displayed.     Outpatient follow-up  Follow-up Information    Sherron Monday, MD. Schedule an appointment as soon as possible for a visit on 07/04/2020.   Specialty: Internal Medicine Why: @ 1:30pm Contact information: [address] [place] [address] [phone]               30 Day Unplanned Readmission Risk Score   Flowsheet Row ED to Hosp-Admission (Discharged) from 06/11/2020 in [facility]  30 Day Unplanned Readmission Risk Score (%) 13.16 Filed at 06/26/2020 1200     This score is the patient's risk of an unplanned readmission within 30 days of being discharged (0 -100%). The score is based on dignosis, age, lab data, medications, orders, and past utilization.   Low:  0-14.9   Medium: 15-21.9   High: 22-29.9   Extreme: 30 and above         Management plans discussed with the patient, family and they are in agreement.  CODE STATUS: Prior   TOTAL TIME TAKING CARE OF THIS PATIENT: 45 minutes.    Delfino Lovett M.D on 06/27/2020 at 9:00 PM  [facility]   CC: Primary care physician; Patient, No Pcp Per   Note: This dictation was prepared with Dragon dictation along with smaller phrase technology. Any transcriptional errors that result from this process are unintentional.

## 2020-12-27 ENCOUNTER — Encounter: Payer: Self-pay | Admitting: *Deleted

## 2020-12-27 ENCOUNTER — Other Ambulatory Visit: Payer: Self-pay

## 2020-12-27 DIAGNOSIS — Z91010 Allergy to peanuts: Secondary | ICD-10-CM | POA: Insufficient documentation

## 2020-12-27 DIAGNOSIS — R1012 Left upper quadrant pain: Secondary | ICD-10-CM | POA: Insufficient documentation

## 2020-12-27 DIAGNOSIS — I10 Essential (primary) hypertension: Secondary | ICD-10-CM | POA: Insufficient documentation

## 2020-12-27 DIAGNOSIS — Z79899 Other long term (current) drug therapy: Secondary | ICD-10-CM | POA: Insufficient documentation

## 2020-12-27 DIAGNOSIS — F17210 Nicotine dependence, cigarettes, uncomplicated: Secondary | ICD-10-CM | POA: Insufficient documentation

## 2020-12-27 LAB — CBC
HCT: 39.8 % (ref 39.0–52.0)
Hemoglobin: 13 g/dL (ref 13.0–17.0)
MCH: 28.3 pg (ref 26.0–34.0)
MCHC: 32.7 g/dL (ref 30.0–36.0)
MCV: 86.7 fL (ref 80.0–100.0)
Platelets: 370 10*3/uL (ref 150–400)
RBC: 4.59 MIL/uL (ref 4.22–5.81)
RDW: 13.2 % (ref 11.5–15.5)
WBC: 6.8 10*3/uL (ref 4.0–10.5)
nRBC: 0 % (ref 0.0–0.2)

## 2020-12-27 LAB — URINALYSIS, COMPLETE (UACMP) WITH MICROSCOPIC
Bacteria, UA: NONE SEEN
Bilirubin Urine: NEGATIVE
Glucose, UA: NEGATIVE mg/dL
Hgb urine dipstick: NEGATIVE
Ketones, ur: NEGATIVE mg/dL
Leukocytes,Ua: NEGATIVE
Nitrite: NEGATIVE
Protein, ur: NEGATIVE mg/dL
Specific Gravity, Urine: 1.025 (ref 1.005–1.030)
Squamous Epithelial / HPF: NONE SEEN (ref 0–5)
pH: 6 (ref 5.0–8.0)

## 2020-12-27 LAB — COMPREHENSIVE METABOLIC PANEL
ALT: 15 U/L (ref 0–44)
AST: 13 U/L — ABNORMAL LOW (ref 15–41)
Albumin: 4.5 g/dL (ref 3.5–5.0)
Alkaline Phosphatase: 72 U/L (ref 38–126)
Anion gap: 11 (ref 5–15)
BUN: 11 mg/dL (ref 6–20)
CO2: 27 mmol/L (ref 22–32)
Calcium: 9.7 mg/dL (ref 8.9–10.3)
Chloride: 103 mmol/L (ref 98–111)
Creatinine, Ser: 1 mg/dL (ref 0.61–1.24)
GFR, Estimated: >60 mL/min (ref >60)
Glucose, Bld: 90 mg/dL (ref 70–99)
Potassium: 3.8 mmol/L (ref 3.5–5.1)
Sodium: 141 mmol/L (ref 135–145)
Total Bilirubin: 0.9 mg/dL (ref 0.3–1.2)
Total Protein: 7.9 g/dL (ref 6.5–8.1)

## 2020-12-27 LAB — LIPASE, BLOOD: Lipase: 24 U/L (ref 11–51)

## 2020-12-27 NOTE — ED Triage Notes (Signed)
Pt has left upper abd pain.  Hx pancreatitis.  Pt has nausea.  No etoh use per pt.  Pt alert speech clear.

## 2020-12-28 ENCOUNTER — Emergency Department
Admission: EM | Admit: 2020-12-28 | Discharge: 2020-12-28 | Disposition: A | Discharge status: Home / Self Care | Payer: Self-pay | Attending: Emergency Medicine | Admitting: Emergency Medicine

## 2020-12-28 DIAGNOSIS — R1012 Left upper quadrant pain: Secondary | ICD-10-CM

## 2020-12-28 NOTE — ED Provider Notes (Signed)
[facility]  ____________________________________________   Event Date/Time   First MD Initiated Contact with Patient 12/28/20 [phone]     (approximate)  I have reviewed the triage vital signs and the nursing notes.   HISTORY  Chief Complaint Abdominal Pain    HPI Marcus Small is a 30 y.o. male with past medical history of alcohol use disorder, currently remission, pancreatitis, hypertension who presents with abdominal pain.  Symptoms started 2 days ago.  He endorses a mild discomfort in his left upper abdomen and sensation of bloating.  It comes and goes, is worse when he lies on that side.  He denies associated nausea, vomiting, diarrhea or constipation.  He denies fevers or chills.  He has not had an alcoholic drink in 7 months.         Past Medical History:  Diagnosis Date   Anxiety    Hypertension     Patient Active Problem List   Diagnosis Date Noted   Seizure (HCC)    Seizures (HCC)    Pressure injury of skin 06/22/2020   Delirium tremens (HCC) 06/14/2020   Acute pancreatitis    Alcohol withdrawal seizure (HCC) 06/12/2020   HTN (hypertension) 06/12/2020   Alcohol withdrawal (HCC) 06/12/2020   Alcohol use 06/12/2020   Alcoholic pancreatitis 06/12/2020   Leukocytosis 06/12/2020   AKI (acute kidney injury) (HCC) 06/12/2020   Hypokalemia 06/12/2020   High anion gap metabolic acidosis 06/12/2020   Closed dislocation of left shoulder 06/12/2020   Elevated troponin 06/12/2020   Tachycardia 06/12/2020    No past surgical history on file.  Prior to Admission medications   Medication Sig Start Date End Date Taking? Authorizing Provider  cloNIDine (CATAPRES - DOSED IN MG/24 HR) 0.3 mg/24hr patch Place 1 patch (0.3 mg total) onto the skin once a week. 06/28/20   Delfino Lovett, MD  lipase/protease/amylase 24000-76000 units CPEP Take 1 capsule (24,000 Units total) by mouth 3 (three) times daily before meals. 06/26/20   Delfino Lovett, MD   metoprolol tartrate (LOPRESSOR) 25 MG tablet Take 1 tablet (25 mg total) by mouth 2 (two) times daily. 06/26/20 07/26/20  Delfino Lovett, MD  mirtazapine (REMERON) 15 MG tablet Take 1 tablet (15 mg total) by mouth at bedtime. 06/26/20 07/26/20  Delfino Lovett, MD    Allergies Milk-related compounds, Peanut-containing drug products, and Shellfish allergy  Family History  Problem Relation Age of Onset   Hypertension Mother     Social History Social History   Tobacco Use   Smoking status: Every Day    Packs/day: 0.50    Types: Cigarettes    Last attempt to quit: 04/17/2015    Years since quitting: 5.7   Smokeless tobacco: Never  Substance Use Topics   Alcohol use: Not Currently    Alcohol/week: 18.0 standard drinks    Types: 18 Cans of beer per week    Comment: daily - beer plus 2 pints of liquor a day    Review of Systems   Review of Systems  Constitutional:  Negative for appetite change, chills, fatigue and fever.  Gastrointestinal:  Positive for abdominal pain. Negative for constipation, diarrhea, nausea and vomiting.  All other systems reviewed and are negative.  Physical Exam Updated Vital Signs BP (!) 163/83 (BP Location: Right Arm)   Pulse 71   Temp 98.8 F (37.1 C) (Oral)   Resp 20   Ht 5\' 5"  (1.651 m)   Wt 84.8 kg   SpO2 100%   BMI 31.12  kg/m   Physical Exam Vitals and nursing note reviewed.  Constitutional:      General: He is not in acute distress.    Appearance: Normal appearance.  HENT:     Head: Normocephalic and atraumatic.  Eyes:     General: No scleral icterus.    Conjunctiva/sclera: Conjunctivae normal.  Pulmonary:     Effort: Pulmonary effort is normal. No respiratory distress.     Breath sounds: Normal breath sounds. No wheezing.  Abdominal:     General: Abdomen is flat.     Palpations: Abdomen is soft.     Tenderness: There is no abdominal tenderness.     Comments: Abdomen is soft throughout, he is nontender in all quadrants including the  epigastric region and left upper quadrant  Musculoskeletal:        General: No deformity or signs of injury.     Cervical back: Normal range of motion.  Skin:    Coloration: Skin is not jaundiced or pale.  Neurological:     General: No focal deficit present.     Mental Status: He is alert and oriented to person, place, and time. Mental status is at baseline.  Psychiatric:        Mood and Affect: Mood normal.        Behavior: Behavior normal.     LABS (all labs ordered are listed, but only abnormal results are displayed)  Labs Reviewed  COMPREHENSIVE METABOLIC PANEL - Abnormal; Notable for the following components:      Result Value   AST 13 (*)    All other components within normal limits  URINALYSIS, COMPLETE (UACMP) WITH MICROSCOPIC - Abnormal; Notable for the following components:   Color, Urine YELLOW (*)    APPearance CLEAR (*)    All other components within normal limits  LIPASE, BLOOD  CBC   ____________________________________________  EKG  N/a ____________________________________________  RADIOLOGY Ky Barban, personally viewed and evaluated these images (plain radiographs) as part of my medical decision making, as well as reviewing the written report by the radiologist.  ED MD interpretation:  n/a    ____________________________________________   PROCEDURES  Procedure(s) performed (including Critical Care):  Procedures   ____________________________________________   INITIAL IMPRESSION / ASSESSMENT AND PLAN / ED COURSE     31 year old male with history of pancreatitis presents with left upper quadrant pain.  Vital signs notable for hypertension otherwise within normal limits.  He is very well-appearing, is not in any distress.  His abdomen is benign he is nontender throughout.  Labs obtained which show a normal lipase, no leukocytosis, normal LFTs.  Patient is worried about pancreatitis however with his normal abdominal exam and normal  lipase my suspicion for pancreatitis is exceedingly low.  Do not feel that he requires imaging at this time.  I advised NSAIDs and we discussed return precautions for worsening pain, fevers or any other concerning symptoms.      ____________________________________________   FINAL CLINICAL IMPRESSION(S) / ED DIAGNOSES  Final diagnoses:  Left upper quadrant abdominal pain     ED Discharge Orders     None        Note:  This document was prepared using Dragon voice recognition software and may include unintentional dictation errors.    Georga Hacking, MD 12/28/20 [phone]

## 2020-12-28 NOTE — Discharge Instructions (Signed)
Your pancreas numbers were normal today indicating that is unlikely you have pancreatitis.  The rest of your blood work is reassuring.  He can continue to take Motrin or Tylenol for the pain.  If you have worsening pain, please return to the emergency department.

## 2021-03-26 ENCOUNTER — Emergency Department
Admission: EM | Admit: 2021-03-26 | Discharge: 2021-03-26 | Disposition: A | Discharge status: Home / Self Care | Payer: Self-pay | Attending: Emergency Medicine | Admitting: Emergency Medicine

## 2021-03-26 ENCOUNTER — Other Ambulatory Visit: Payer: Self-pay

## 2021-03-26 DIAGNOSIS — Z20822 Contact with and (suspected) exposure to covid-19: Secondary | ICD-10-CM | POA: Insufficient documentation

## 2021-03-26 DIAGNOSIS — Z79899 Other long term (current) drug therapy: Secondary | ICD-10-CM | POA: Insufficient documentation

## 2021-03-26 DIAGNOSIS — F17210 Nicotine dependence, cigarettes, uncomplicated: Secondary | ICD-10-CM | POA: Insufficient documentation

## 2021-03-26 DIAGNOSIS — R0981 Nasal congestion: Secondary | ICD-10-CM | POA: Insufficient documentation

## 2021-03-26 DIAGNOSIS — R059 Cough, unspecified: Secondary | ICD-10-CM | POA: Insufficient documentation

## 2021-03-26 DIAGNOSIS — R509 Fever, unspecified: Secondary | ICD-10-CM | POA: Insufficient documentation

## 2021-03-26 DIAGNOSIS — J020 Streptococcal pharyngitis: Secondary | ICD-10-CM

## 2021-03-26 DIAGNOSIS — J029 Acute pharyngitis, unspecified: Secondary | ICD-10-CM | POA: Insufficient documentation

## 2021-03-26 DIAGNOSIS — I10 Essential (primary) hypertension: Secondary | ICD-10-CM | POA: Insufficient documentation

## 2021-03-26 DIAGNOSIS — M791 Myalgia, unspecified site: Secondary | ICD-10-CM | POA: Insufficient documentation

## 2021-03-26 LAB — GROUP A STREP BY PCR: Group A Strep by PCR: DETECTED — AB

## 2021-03-26 LAB — RESP PANEL BY RT-PCR (FLU A&B, COVID) ARPGX2
Influenza A by PCR: NEGATIVE
Influenza B by PCR: NEGATIVE
SARS Coronavirus 2 by RT PCR: NEGATIVE

## 2021-03-26 MED ORDER — PENICILLIN V POTASSIUM 500 MG PO TABS
500.0000 mg | ORAL_TABLET | Freq: Once | ORAL | Status: AC
  Administered 2021-03-26: 500 mg via ORAL
  Filled 2021-03-26: qty 1

## 2021-03-26 MED ORDER — GUAIFENESIN 200 MG PO TABS: 400.0000 mg | ORAL_TABLET | ORAL | 0 refills | Status: DC | PRN

## 2021-03-26 MED ORDER — LIDOCAINE VISCOUS HCL 2 % MT SOLN
15.0000 mL | Freq: Once | OROMUCOSAL | Status: AC
  Administered 2021-03-26: 15 mL via OROMUCOSAL
  Filled 2021-03-26: qty 15

## 2021-03-26 MED ORDER — DEXAMETHASONE 6 MG PO TABS
12.0000 mg | ORAL_TABLET | Freq: Once | ORAL | Status: AC
  Administered 2021-03-26: 12 mg via ORAL
  Filled 2021-03-26: qty 2

## 2021-03-26 MED ORDER — PENICILLIN V POTASSIUM 500 MG PO TABS: 500.0000 mg | ORAL_TABLET | Freq: Four times a day (QID) | ORAL | 0 refills | Status: AC

## 2021-03-26 MED ORDER — DEXAMETHASONE 6 MG PO TABS: 12.0000 mg | ORAL_TABLET | Freq: Once | ORAL | 0 refills | Status: AC

## 2021-03-26 MED ORDER — IBUPROFEN 600 MG PO TABS: 600.0000 mg | ORAL_TABLET | Freq: Four times a day (QID) | ORAL | 0 refills | Status: DC | PRN

## 2021-03-26 NOTE — ED Triage Notes (Signed)
Pt c/o sinus congestion, sore throat , body aches, fever since Friday.

## 2021-03-26 NOTE — ED Provider Notes (Signed)
[facility] Emergency Department Provider Note ____________________________________________   Event Date/Time   First MD Initiated Contact with Patient 03/26/21 1829     (approximate)  I have reviewed the triage vital signs and the nursing notes.   HISTORY  Chief Complaint URI    HPI Marcus Small is a 30 y.o. male with PMH as noted below who presents with sore throat over the last 3 days, persistent course, associated with subjective fever, body aches, nasal congestion, and nonproductive cough.  The patient states it is painful to swallow although he has been able to tolerate p.o.  He denies any vomiting but has had some diarrhea.  He has taken Alka-Seltzer with minimal relief.  Past Medical History:  Diagnosis Date   Anxiety    Hypertension     Patient Active Problem List   Diagnosis Date Noted   Seizure (HCC)    Seizures (HCC)    Pressure injury of skin 06/22/2020   Delirium tremens (HCC) 06/14/2020   Acute pancreatitis    Alcohol withdrawal seizure (HCC) 06/12/2020   HTN (hypertension) 06/12/2020   Alcohol withdrawal (HCC) 06/12/2020   Alcohol use 06/12/2020   Alcoholic pancreatitis 06/12/2020   Leukocytosis 06/12/2020   AKI (acute kidney injury) (HCC) 06/12/2020   Hypokalemia 06/12/2020   High anion gap metabolic acidosis 06/12/2020   Closed dislocation of left shoulder 06/12/2020   Elevated troponin 06/12/2020   Tachycardia 06/12/2020    History reviewed. No pertinent surgical history.  Prior to Admission medications   Medication Sig Start Date End Date Taking? Authorizing Provider  cloNIDine (CATAPRES - DOSED IN MG/24 HR) 0.3 mg/24hr patch Place 1 patch (0.3 mg total) onto the skin once a week. 06/28/20   Delfino Lovett, MD  lipase/protease/amylase 24000-76000 units CPEP Take 1 capsule (24,000 Units total) by mouth 3 (three) times daily before meals. 06/26/20   Delfino Lovett, MD  metoprolol tartrate (LOPRESSOR) 25 MG tablet Take 1  tablet (25 mg total) by mouth 2 (two) times daily. 06/26/20 07/26/20  Delfino Lovett, MD  mirtazapine (REMERON) 15 MG tablet Take 1 tablet (15 mg total) by mouth at bedtime. 06/26/20 07/26/20  Delfino Lovett, MD    Allergies Milk-related compounds, Peanut-containing drug products, and Shellfish allergy  Family History  Problem Relation Age of Onset   Hypertension Mother     Social History Social History   Tobacco Use   Smoking status: Every Day    Packs/day: 0.50    Types: Cigarettes    Last attempt to quit: 04/17/2015    Years since quitting: 5.9   Smokeless tobacco: Never  Substance Use Topics   Alcohol use: Not Currently    Alcohol/week: 18.0 standard drinks    Types: 18 Cans of beer per week    Comment: daily - beer plus 2 pints of liquor a day    Review of Systems  Constitutional: As of for fever. Eyes: No visual changes. ENT: Positive for sore throat. Cardiovascular: Denies chest pain. Respiratory: Denies shortness of breath. Gastrointestinal: No vomiting.   Genitourinary: Negative for dysuria.  Musculoskeletal: Positive for body aches. Skin: Negative for rash. Neurological: Positive for headache.   ____________________________________________   PHYSICAL EXAM:  VITAL SIGNS: ED Triage Vitals [03/26/21 1603]  Enc Vitals Group     BP 136/82     Pulse Rate 94     Resp 17     Temp 99.4 F (37.4 C)     Temp Source Oral     SpO2  97 %     Weight 215 lb (97.5 kg)     Height 5\' 5"  (1.651 m)     Head Circumference      Peak Flow      Pain Score 10     Pain Loc      Pain Edu?      Excl. in GC?     Constitutional: Alert and oriented. Well appearing and in no acute distress. Eyes: Conjunctivae are normal.  Head: Atraumatic. Nose: No congestion/rhinnorhea. Mouth/Throat: Tonsils enlarged.  Oropharynx erythematous with slight exudates. Neck: Normal range of motion.  Cardiovascular: Normal rate, regular rhythm. Grossly normal heart sounds.  Good peripheral  circulation. Respiratory: Normal respiratory effort.  No retractions. Lungs CTAB. Gastrointestinal: No distention.  Musculoskeletal: Extremities warm and well perfused.  Neurologic:  Normal speech and language. No gross focal neurologic deficits are appreciated.  Skin:  Skin is warm and dry. No rash noted. Psychiatric: Mood and affect are normal. Speech and behavior are normal.  ____________________________________________   LABS (all labs ordered are listed, but only abnormal results are displayed)  Labs Reviewed  RESP PANEL BY RT-PCR (FLU A&B, COVID) ARPGX2  GROUP A STREP BY PCR   ____________________________________________  EKG   ____________________________________________  RADIOLOGY    ____________________________________________   PROCEDURES  Procedure(s) performed: No  Procedures  Critical Care performed: No ____________________________________________   INITIAL IMPRESSION / ASSESSMENT AND PLAN / ED COURSE  Pertinent labs & imaging results that were available during my care of the patient were reviewed by me and considered in my medical decision making (see chart for details).   30 year old male with PMH as noted above presents with sore throat over the last several days associated with URI symptoms, subjective fever, and body aches.  Exam reveals borderline elevated temperature, swollen tonsils, and faint exudates to the posterior oropharynx.  Lungs are clear to auscultation.  Overall presentation is most consistent with viral pharyngitis although strep throat is possible.  Respiratory panel is negative.  The patient's airway is intact.  There is no evidence of peritonsillar abscess or other acute complication.  We will give Decadron for symptomatic treatment, obtain a strep swab, and reassess.  ----------------------------------------- 7:18 PM on 03/26/2021 -----------------------------------------  Swab is pending.  I have signed the patient out to  the oncoming ED physician Dr. 14/03/2021.   ____________________________________________   FINAL CLINICAL IMPRESSION(S) / ED DIAGNOSES  Final diagnoses:  Pharyngitis, unspecified etiology      NEW MEDICATIONS STARTED DURING THIS VISIT:  New Prescriptions   No medications on file     Note:  This document was prepared using Dragon voice recognition software and may include unintentional dictation errors.    Darnelle Catalan, MD 03/26/21 1919

## 2021-03-26 NOTE — Discharge Instructions (Addendum)
Your strep test was positive.  I will give you some penicillin.  You can take 1 500 mg pill 4 times a day.  Finish all 10 days of it.  Please return if you are getting worse or not any better in 3 or 4 days.  That should take care of the sore throat.  You should go to go back to work after 24 hours on antibiotics or Wednesday should be good.

## 2021-03-26 NOTE — ED Provider Notes (Signed)
Patient strep test is positive.  I will give him some penicillin.  He will have enough penicillin if he skips 1 dose or forgets 1 dose in a day he will be okay.   Arnaldo Natal, MD 03/26/21 [phone]

## 2021-10-29 ENCOUNTER — Other Ambulatory Visit: Payer: Self-pay

## 2021-10-29 ENCOUNTER — Emergency Department
Admission: EM | Admit: 2021-10-29 | Discharge: 2021-10-29 | Disposition: A | Discharge status: Home / Self Care | Payer: 59 | Attending: Emergency Medicine | Admitting: Emergency Medicine

## 2021-10-29 DIAGNOSIS — M545 Low back pain, unspecified: Secondary | ICD-10-CM

## 2021-10-29 DIAGNOSIS — I10 Essential (primary) hypertension: Secondary | ICD-10-CM | POA: Diagnosis not present

## 2021-10-29 NOTE — ED Notes (Signed)
Pt evaluated and d/c by PA in triage

## 2021-10-29 NOTE — ED Triage Notes (Signed)
Pt has been having lower back pain for the last few weeks. Pt states it got worse today. Pt states lower back hurts worse when working (lifting heavy objects)

## 2021-10-29 NOTE — Discharge Instructions (Addendum)
-  You may take Tylenol/ibuprofen as needed for pain.  -Follow-up with the orthopedist listed in these instructions if your pain fails to improve after 2 to 3 weeks.  -Return to the emergency department anytime if you begin to experience any new or worsening symptoms.

## 2021-10-29 NOTE — ED Provider Notes (Signed)
[facility] Provider Note    None    (approximate)   History   Chief Complaint Back Pain   HPI Marcus Small is a 31 y.o. male, history of hypertension, seizures, alcohol use, anxiety, presents to the emergency department for evaluation of low back pain.  He states that he has been having lower back pain for the past 2 weeks, but was reportedly worse today.  Denies any recent injuries or illnesses, though has been lifting a lot of heavy objects at his work recently.  Denies fever/chills, chest pain, shortness of breath, saddle anesthesia, bowel/bladder dysfunction, nausea/vomiting, recent weight loss, rash/lesions, dizziness/lightheadedness, headache, urinary symptoms, or flank pain.  Denies pain/numbness/tingling in lower extremities.  History Limitations: No limitations.        Physical Exam  Triage Vital Signs: ED Triage Vitals  Enc Vitals Group     BP 10/29/21 2219 (!) 148/94     Pulse Rate 10/29/21 2219 75     Resp 10/29/21 2219 17     Temp 10/29/21 2219 99.1 F (37.3 C)     Temp Source 10/29/21 2219 Oral     SpO2 10/29/21 2219 100 %     Weight 10/29/21 2221 214 lb 15.2 oz (97.5 kg)     Height 10/29/21 2221 5\' 6"  (1.676 m)     Head Circumference --      Peak Flow --      Pain Score 10/29/21 2220 10     Pain Loc --      Pain Edu? --      Excl. in GC? --     Most recent vital signs: Vitals:   10/29/21 2219  BP: (!) 148/94  Pulse: 75  Resp: 17  Temp: 99.1 F (37.3 C)  SpO2: 100%    General: Awake, NAD.  Skin: Warm, dry. No rashes or lesions.  Eyes: PERRL. Conjunctivae normal.  CV: Good peripheral perfusion.  Resp: Normal effort.  Abd: Soft, non-tender. No distention.  Neuro: At baseline. No gross neurological deficits.   Focused Exam: No midline spinal tenderness or CVA tenderness.  Notable tightness in the paraspinal muscles of the lumbar region.  Physical Exam    ED Results / Procedures / Treatments  Labs (all  labs ordered are listed, but only abnormal results are displayed) Labs Reviewed - No data to display   EKG N/A.   RADIOLOGY  ED Provider Interpretation: N/A.  No results found.  PROCEDURES:  Critical Care performed: N/A.  Procedures    MEDICATIONS ORDERED IN ED: Medications - No data to display   IMPRESSION / MDM / ASSESSMENT AND PLAN / ED COURSE  I reviewed the triage vital signs and the nursing notes.                              Differential diagnosis includes, but is not limited to, lumbosacral strain, herniated disc, compression fracture, sciatica.  Assessment/Plan Presentation consistent with lumbosacral strain, likely secondary to recent heavy lifting at his work.  History and physical exam not suggestive of any vertebral osseous injury.  He does not have any radicular symptoms.  Low suspicion for any serious or life-threatening pathology.  Advised the patient that imaging in this scenario would be premature and likely low yield.  Recommended conservative management with Tylenol/naproxen and muscle relaxers at home.  Patient ultimately agreed.  He states that he will take Tylenol/naproxen at home, as well as  lidocaine patches, but declined the muscle relaxers.  I believe this is reasonable.  We will additionally provide him with a referral to orthopedics if his pain fails to improve.  We will plan discharge.  Patient's presentation is most consistent with acute, uncomplicated illness.   Provided the patient with anticipatory guidance, return precautions, and educational material. Encouraged the patient to return to the emergency department at any time if they begin to experience any new or worsening symptoms. Patient expressed understanding and agreed with the plan.       FINAL CLINICAL IMPRESSION(S) / ED DIAGNOSES   Final diagnoses:  Acute bilateral low back pain without sciatica     Rx / DC Orders   ED Discharge Orders     None        Note:  This  document was prepared using Dragon voice recognition software and may include unintentional dictation errors.   Varney Daily, [place] 10/30/21 1334    Georga Hacking, MD 10/31/21 [phone]

## 2022-01-30 ENCOUNTER — Ambulatory Visit: Payer: Self-pay | Admitting: Nurse Practitioner

## 2022-01-30 ENCOUNTER — Encounter: Payer: Self-pay | Admitting: Nurse Practitioner

## 2022-01-30 DIAGNOSIS — Z113 Encounter for screening for infections with a predominantly sexual mode of transmission: Secondary | ICD-10-CM

## 2022-01-30 DIAGNOSIS — Z202 Contact with and (suspected) exposure to infections with a predominantly sexual mode of transmission: Secondary | ICD-10-CM

## 2022-01-30 LAB — GRAM STAIN

## 2022-01-30 MED ORDER — DOXYCYCLINE HYCLATE 100 MG PO TABS: 100.0000 mg | ORAL_TABLET | Freq: Two times a day (BID) | ORAL | 0 refills | Status: AC

## 2022-01-30 NOTE — Progress Notes (Addendum)
[facility] Department STI clinic/screening visit  Subjective:  Marcus Small is a 31 y.o. male being seen today for an STI screening visit. The patient reports they do have symptoms.    Patient has the following medical conditions:   Patient Active Problem List   Diagnosis Date Noted   Seizure ([place])    Seizures ([place])    Pressure injury of skin 06/22/2020   Delirium tremens ([place]) 06/14/2020   Acute pancreatitis    Alcohol withdrawal seizure ([place]) 06/12/2020   HTN (hypertension) 06/12/2020   Alcohol withdrawal ([place]) 06/12/2020   Alcohol use [id number]   Alcoholic pancreatitis [id number]   Leukocytosis 06/12/2020   AKI (acute kidney injury) ([place]) 06/12/2020   Hypokalemia 06/12/2020   High anion gap metabolic acidosis [id number]   Closed dislocation of left shoulder 06/12/2020   Elevated troponin 06/12/2020   Tachycardia 06/12/2020     Chief Complaint  Patient presents with   SEXUALLY TRANSMITTED DISEASE    Screening/treatment - patient was exposed to chlamydia patient states he is having discharge and burning when urinating and has experienced a sore throat since exposure     HPI  Patient reports to clinic today for STD screening.  Patient states he is a contact to Chlamydia and also reports some discharge, dysuria, and a sore throat that began one week ago.    Last HIV test per patient/review of record was:  Lab Results  Component Value Date   HIV Non Reactive 06/12/2020    Does the patient or their partner desires a pregnancy in the next year? No  Screening for MPX risk: Does the patient have an unexplained rash? No Is the patient MSM? No Does the patient endorse multiple sex partners or anonymous sex partners? Yes Did the patient have close or sexual contact with a person diagnosed with MPX? No Has the patient traveled outside the [place] where MPX is endemic? No Is there a high clinical suspicion for MPX-- evidenced by one of the following  No  -Unlikely to be chickenpox  -Lymphadenopathy  -Rash that present in same phase of evolution on any given body part   See flowsheet for further details and programmatic requirements.    There is no immunization history on file for this patient.   The following portions of the patient's history were reviewed and updated as appropriate: allergies, current medications, past medical history, past social history, past surgical history and problem list.  Objective:  There were no vitals filed for this visit.  Physical Exam Constitutional:      Appearance: Normal appearance.  HENT:     Head: Normocephalic. No abrasion, masses or laceration. Hair is normal.     Right Ear: External ear normal.     Left Ear: External ear normal.     Nose: Nose normal.     Mouth/Throat:     Lips: Pink.     Mouth: Mucous membranes are moist. No oral lesions.     Pharynx: No pharyngeal swelling, oropharyngeal exudate, posterior oropharyngeal erythema or uvula swelling.     Tonsils: No tonsillar exudate or tonsillar abscesses.  Eyes:     General: Lids are normal.        Right eye: No discharge.        Left eye: No discharge.     Conjunctiva/sclera: Conjunctivae normal.     Right eye: No exudate.    Left eye: No exudate. Abdominal:     General: Abdomen is flat.  Palpations: Abdomen is soft.     Tenderness: There is no abdominal tenderness. There is no rebound.  Genitourinary:    Pubic Area: No rash or pubic lice.      Penis: Normal and circumcised. No erythema or discharge.      Testes: Normal.        Right: Mass or tenderness not present.        Left: Mass or tenderness not present.     Rectum: Normal.     Comments: Discharge amount: none Color: none Musculoskeletal:     Cervical back: Full passive range of motion without pain, normal range of motion and neck supple.  Lymphadenopathy:     Cervical: No cervical adenopathy.     Right cervical: No superficial, deep or posterior cervical  adenopathy.    Left cervical: No superficial, deep or posterior cervical adenopathy.     Upper Body:     Right upper body: No supraclavicular, axillary or epitrochlear adenopathy.     Left upper body: No supraclavicular, axillary or epitrochlear adenopathy.     Lower Body: No right inguinal adenopathy. No left inguinal adenopathy.  Skin:    General: Skin is warm and dry.     Findings: No lesion or rash.  Neurological:     Mental Status: He is alert and oriented to person, place, and time.  Psychiatric:        Attention and Perception: Attention normal.        Mood and Affect: Mood normal.        Speech: Speech normal.        Behavior: Behavior normal. Behavior is cooperative.       Assessment and Plan:  Marcus Small is a 31 y.o. male presenting to the [facility] Department for STI screening  1. Exposure to chlamydia -Treat patient today as a contact to Chlamydia.  No sex for 7 days and 7 days after partner is treated.   - doxycycline (VIBRA-TABS) 100 MG tablet; Take 1 tablet (100 mg total) by mouth 2 (two) times daily.  Dispense: 14 tablet; Refill: 0  2. Screening examination for venereal disease -STD screening. -Patient does have STI symptoms Patient accepted all screenings including oral GC, urine CT/GC and declines bloodwork for HIV/RPR.  Patient meets criteria for HepB screening? Yes. Ordered? No - refused Patient meets criteria for HepC screening? Yes. Ordered? No - refused Recommended condom use with all sex Discussed importance of condom use for STI prevent  Treat gram stain per standing order Discussed time line for State Lab results and that patient will be called with positive results and encouraged patient to call if he had not heard in 2 weeks Recommended returning for continued or worsening symptoms.    - Gonococcus culture - Gram stain - Chlamydia/GC NAA, Confirmation   Total time spent: 30 minutes   Return if symptoms worsen or fail to  improve.    Gregary Cromer, FNP

## 2022-01-30 NOTE — Progress Notes (Unsigned)
Pt appointment for STI screening with symptoms. Seen by FNP White. Initial test results reviewed with pt and doxycycline dispensed with instructions.

## 2022-01-31 ENCOUNTER — Ambulatory Visit: Payer: Self-pay

## 2022-02-04 LAB — CHLAMYDIA/GC NAA, CONFIRMATION
Chlamydia trachomatis, NAA: NEGATIVE
Neisseria gonorrhoeae, NAA: NEGATIVE

## 2022-02-04 LAB — GONOCOCCUS CULTURE

## 2022-02-14 NOTE — Addendum Note (Signed)
Addended by: Jarick Harkins on: 02/14/2022 12:11 PM   Modules accepted: Orders  

## 2022-10-04 IMAGING — DX DG CHEST 1V PORT
1 series · 1 of 1 positions shown · non-contrast
Comparison: 06/17/2020

CLINICAL DATA: Respiratory failure

EXAM:
PORTABLE CHEST 1 VIEW

[chest ap]
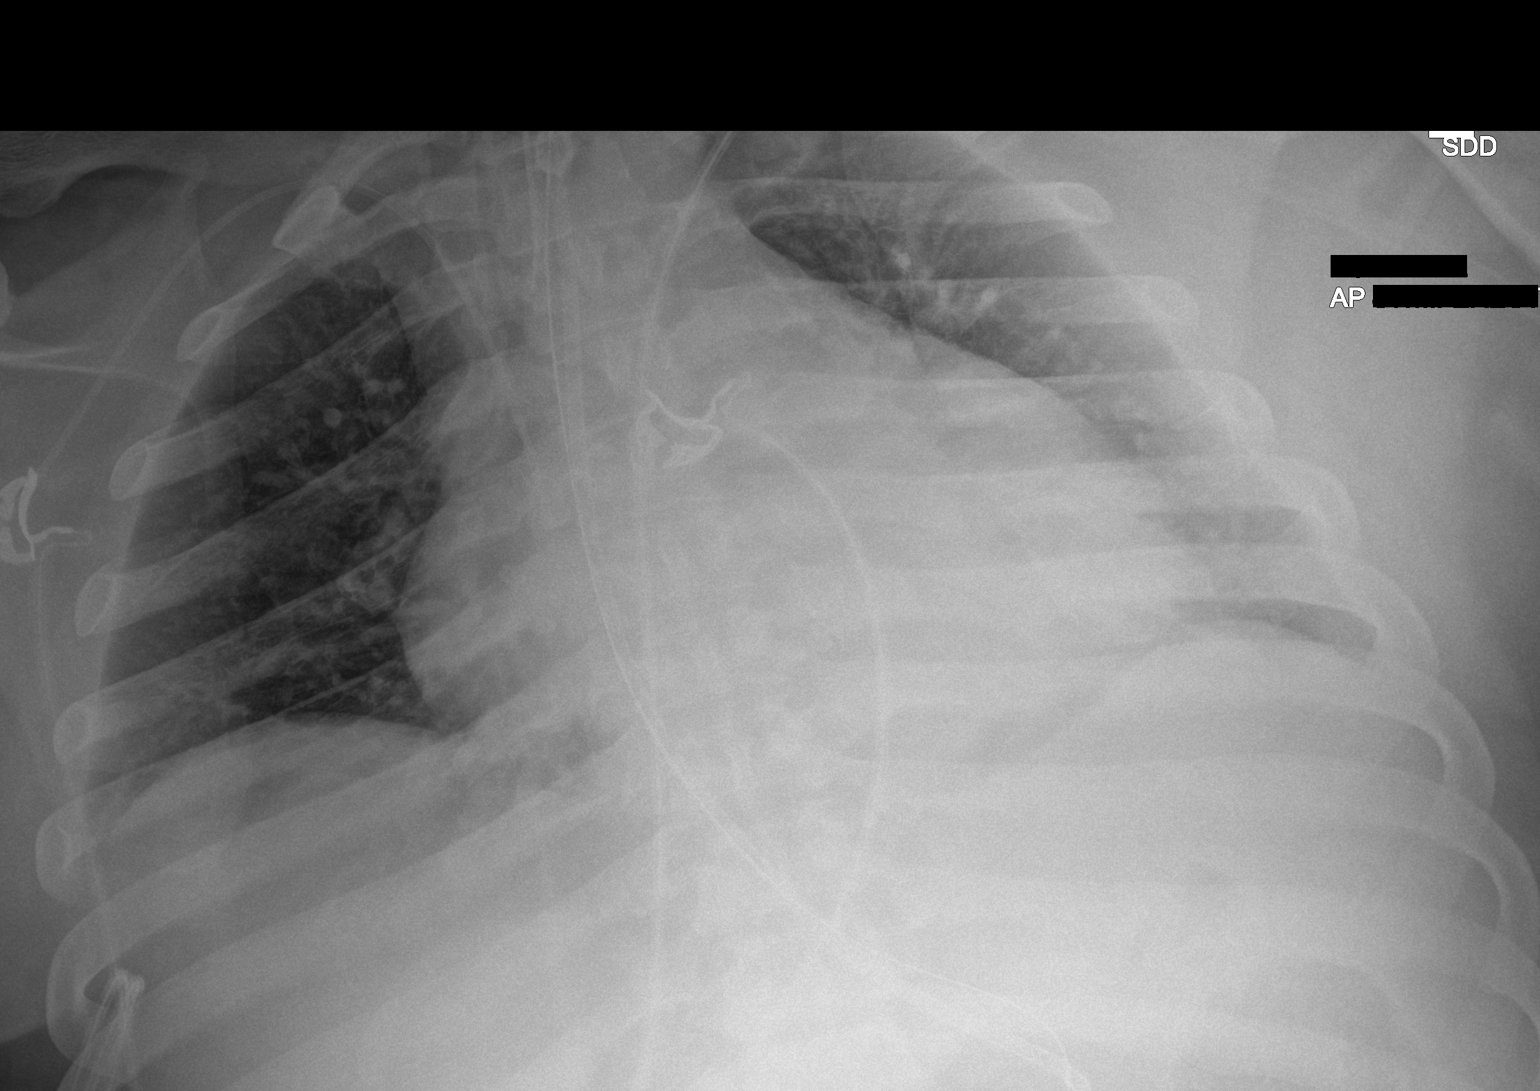

[1 of 1 positions shown; findings below may reference images not displayed]

FINDINGS: Endotracheal tube has been advanced and its tip is now seen 2.6 cm
above the carina. Nasogastric tube and nasoenteric feeding tube
extend into the upper abdomen. Right upper extremity PICC line tip
noted within the superior cavoatrial junction.

Lung volumes remain extremely small though pulmonary insufflation
remain stable since prior examination. The lungs are clear. No
pneumothorax or pleural effusion. Cardiac size is within normal
limits when accounting for poor pulmonary insufflation. The
pulmonary vascularity is normal.
IMPRESSION: Support lines and tubes now in appropriate position.

Stable pulmonary hypoinflation.

## 2022-10-04 IMAGING — DX DG ABDOMEN 1V
1 series · 1 of 1 positions shown · non-contrast
Comparison: 06/17/2020

CLINICAL DATA: Orogastric tube placement

EXAM:
ABDOMEN - 1 VIEW

[abdomen supine]
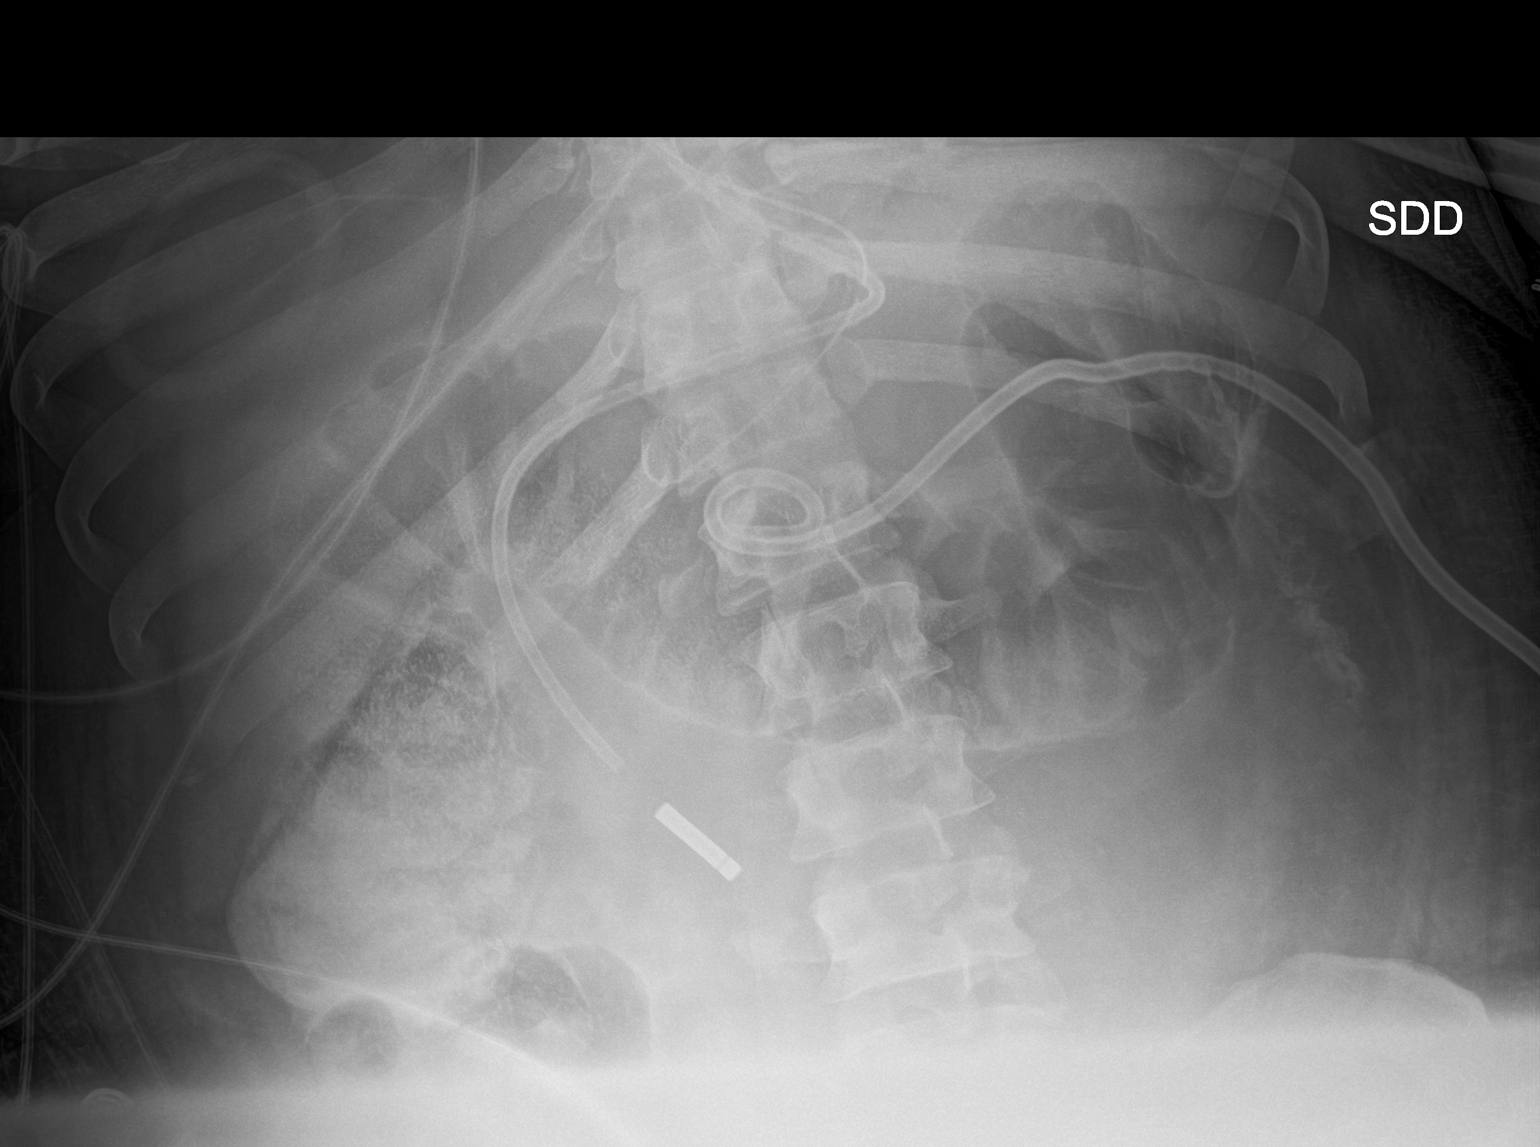

[1 of 1 positions shown; findings below may reference images not displayed]

FINDINGS: Nasogastric tube extends into the distal body of the stomach.
Nasoenteric feeding tube extends into the expected juncture of the
second and third portion of the duodenum. Percutaneous drainage
catheter is again seen overlying the epigastrium. Pelvis excluded
from view. Visualized abdominal gas pattern is normal. No gross free
intraperitoneal gas.
IMPRESSION: Nasoenteric feeding tube tip within the juncture of the second and
third portion of the duodenum.

Nasogastric tube within the distal stomach.

## 2022-10-09 IMAGING — DX DG CHEST 1V PORT
1 series · 1 of 1 positions shown · non-contrast
Comparison: 06/21/2020

CLINICAL DATA: Fever.  Delirium tremens.

EXAM:
PORTABLE CHEST 1 VIEW

[chest ap]
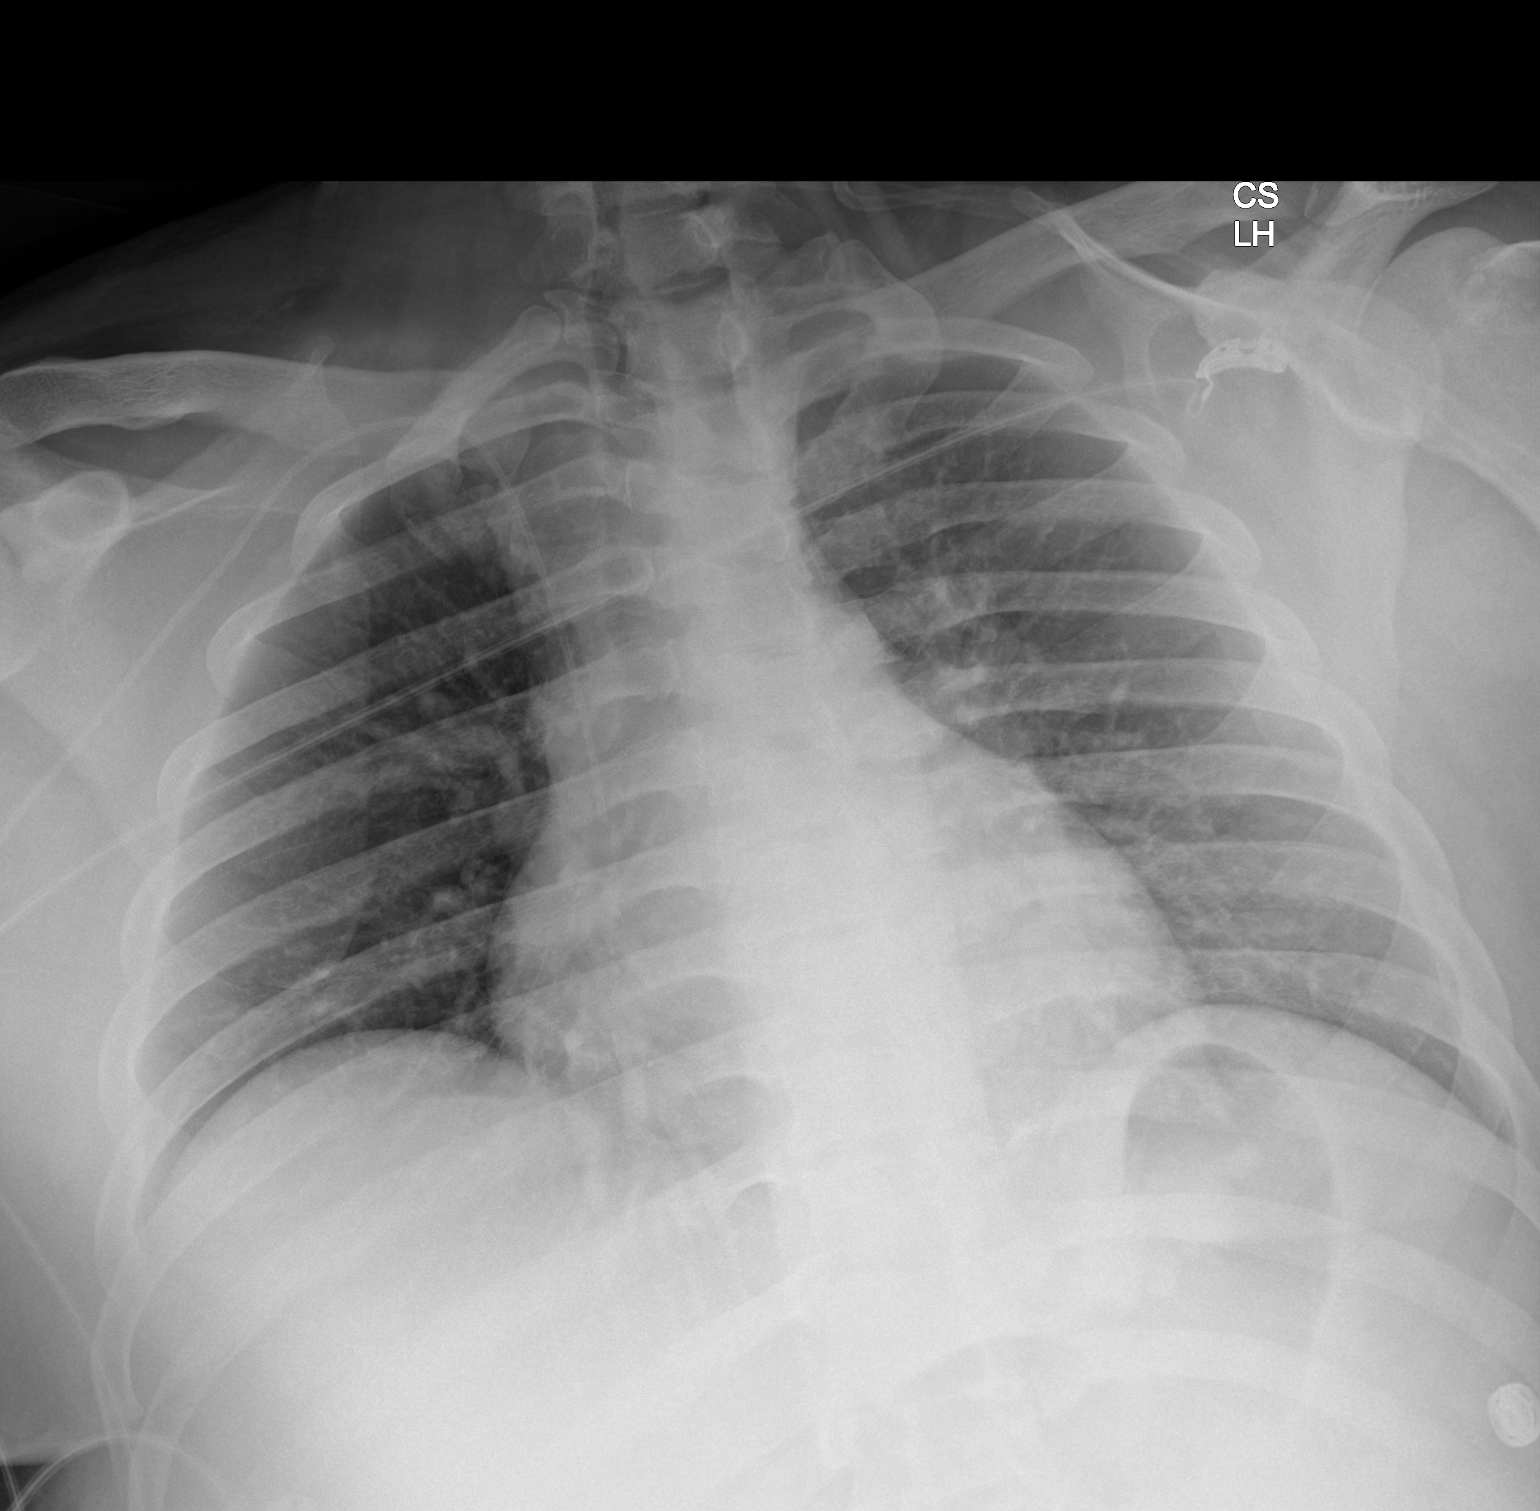

[1 of 1 positions shown; findings below may reference images not displayed]

FINDINGS: The right PICC line is stable.

The feeding tube has been removed.

The cardiac silhouette, mediastinal and hilar contours are within
normal limits and stable. No acute pulmonary findings. No pleural
effusions. No pneumothorax.
IMPRESSION: No acute cardiopulmonary findings.

## 2022-12-09 ENCOUNTER — Other Ambulatory Visit: Payer: Self-pay

## 2022-12-09 ENCOUNTER — Emergency Department
Admission: EM | Admit: 2022-12-09 | Discharge: 2022-12-09 | Disposition: A | Discharge status: Home / Self Care | Payer: 59 | Attending: Student in an Organized Health Care Education/Training Program | Admitting: Student in an Organized Health Care Education/Training Program

## 2022-12-09 DIAGNOSIS — R110 Nausea: Secondary | ICD-10-CM | POA: Diagnosis not present

## 2022-12-09 DIAGNOSIS — R1013 Epigastric pain: Secondary | ICD-10-CM | POA: Insufficient documentation

## 2022-12-09 HISTORY — DX: Acute pancreatitis without necrosis or infection, unspecified: K85.90

## 2022-12-09 LAB — CBC WITH DIFFERENTIAL/PLATELET
Abs Immature Granulocytes: 0.01 10*3/uL (ref 0.00–0.07)
Basophils Absolute: 0.1 10*3/uL (ref 0.0–0.1)
Basophils Relative: 2 %
Eosinophils Absolute: 0 10*3/uL (ref 0.0–0.5)
Eosinophils Relative: 0 %
HCT: 41.3 % (ref 39.0–52.0)
Hemoglobin: 13.4 g/dL (ref 13.0–17.0)
Immature Granulocytes: 0 %
Lymphocytes Relative: 33 %
Lymphs Abs: 1.7 10*3/uL (ref 0.7–4.0)
MCH: 28.5 pg (ref 26.0–34.0)
MCHC: 32.4 g/dL (ref 30.0–36.0)
MCV: 87.9 fL (ref 80.0–100.0)
Monocytes Absolute: 0.3 10*3/uL (ref 0.1–1.0)
Monocytes Relative: 6 %
Neutro Abs: 3 10*3/uL (ref 1.7–7.7)
Neutrophils Relative %: 59 %
Platelets: 351 10*3/uL (ref 150–400)
RBC: 4.7 MIL/uL (ref 4.22–5.81)
RDW: 11.9 % (ref 11.5–15.5)
WBC: 5.1 10*3/uL (ref 4.0–10.5)
nRBC: 0 % (ref 0.0–0.2)

## 2022-12-09 LAB — COMPREHENSIVE METABOLIC PANEL
ALT: 14 U/L (ref 0–44)
AST: 16 U/L (ref 15–41)
Albumin: 4.1 g/dL (ref 3.5–5.0)
Alkaline Phosphatase: 50 U/L (ref 38–126)
Anion gap: 8 (ref 5–15)
BUN: 14 mg/dL (ref 6–20)
CO2: 25 mmol/L (ref 22–32)
Calcium: 8.9 mg/dL (ref 8.9–10.3)
Chloride: 106 mmol/L (ref 98–111)
Creatinine, Ser: 1.15 mg/dL (ref 0.61–1.24)
GFR, Estimated: >60 mL/min (ref >60)
Glucose, Bld: 87 mg/dL (ref 70–99)
Potassium: 3.6 mmol/L (ref 3.5–5.1)
Sodium: 139 mmol/L (ref 135–145)
Total Bilirubin: 0.6 mg/dL (ref 0.3–1.2)
Total Protein: 7 g/dL (ref 6.5–8.1)

## 2022-12-09 LAB — LIPASE, BLOOD: Lipase: 32 U/L (ref 11–51)

## 2022-12-09 MED ORDER — SODIUM CHLORIDE 0.9 % IV BOLUS
1000.0000 mL | Freq: Once | INTRAVENOUS | Status: AC
  Administered 2022-12-09: 1000 mL via INTRAVENOUS

## 2022-12-09 MED ORDER — PANTOPRAZOLE SODIUM 20 MG PO TBEC: 20.0000 mg | DELAYED_RELEASE_TABLET | Freq: Every day | ORAL | 1 refills | Status: AC

## 2022-12-09 MED ORDER — MORPHINE SULFATE (PF) 4 MG/ML IV SOLN: 4.0000 mg | INTRAVENOUS | Status: DC | PRN

## 2022-12-09 MED ORDER — ONDANSETRON HCL 4 MG/2ML IJ SOLN
4.0000 mg | Freq: Once | INTRAMUSCULAR | Status: AC
  Administered 2022-12-09: 4 mg via INTRAVENOUS
  Filled 2022-12-09: qty 2

## 2022-12-09 MED ORDER — ONDANSETRON 4 MG PO TBDP: 4.0000 mg | ORAL_TABLET | Freq: Three times a day (TID) | ORAL | 0 refills | Status: AC | PRN

## 2022-12-09 MED ORDER — ALUM & MAG HYDROXIDE-SIMETH 200-200-20 MG/5ML PO SUSP
30.0000 mL | Freq: Once | ORAL | Status: AC
Start: 2022-12-09 — End: 2022-12-09
  Administered 2022-12-09: 30 mL via ORAL
  Filled 2022-12-09: qty 30

## 2022-12-09 NOTE — ED Triage Notes (Signed)
Pt c/o RUQ abd pain with nausea that started last night but wok him up at 0400 approx. 051m when he was walking to work this am. Pt sts he has a hx of pancreatitis.

## 2022-12-09 NOTE — ED Provider Notes (Signed)
[facility] Provider Note    Event Date/Time   First MD Initiated Contact with Patient 12/09/22 [phone]     (approximate)   History   Abdominal Pain (Pt c/o RUQ abd pain with nausea that started last night but wok him up at 0400 approx. 058m when he was walking to work this am. Pt sts he has a hx of pancreatitis. )   HPI  Marcus Small is a 32 y.o. male presents the ER for evaluation of epigastric pain associate with nausea.  States been off and on for about a week.  Woke him up around 4:00 this morning and he was walking work this morning when he fell like he got worse.  Does have a history of pancreatitis.  Has had some loose stool.  Denies any lower abdominal pain no fevers or chills.  Denies any shortness of breath.  Denies any significant recent alcohol intake.     Physical Exam   Triage Vital Signs: ED Triage Vitals  Encounter Vitals Group     BP 12/09/22 0633 122/65     Systolic BP Percentile --      Diastolic BP Percentile --      Pulse Rate 12/09/22 0631 89     Resp 12/09/22 0631 18     Temp 12/09/22 0631 98.2 F (36.8 C)     Temp src --      SpO2 12/09/22 0631 100 %     Weight 12/09/22 0632 196 lb (88.9 kg)     Height 12/09/22 0632 5\' 6"  (1.676 m)     Head Circumference --      Peak Flow --      Pain Score --      Pain Loc --      Pain Education --      Exclude from Growth Chart --     Most recent vital signs: Vitals:   12/09/22 0730 12/09/22 0800  BP: 138/87 139/70  Pulse: 88 89  Resp: 16 14  Temp:    SpO2: 100% 100%     Constitutional: Alert  Eyes: Conjunctivae are normal.  Head: Atraumatic. Nose: No congestion/rhinnorhea. Mouth/Throat: Mucous membranes are moist.   Neck: Painless ROM.  Cardiovascular:   Good peripheral circulation. Respiratory: Normal respiratory effort.  No retractions.  Gastrointestinal: Soft and nontender.  Musculoskeletal:  no deformity Neurologic:  MAE spontaneously. No gross focal  neurologic deficits are appreciated.  Skin:  Skin is warm, dry and intact. No rash noted. Psychiatric: Mood and affect are normal. Speech and behavior are normal.    ED Results / Procedures / Treatments   Labs (all labs ordered are listed, but only abnormal results are displayed) Labs Reviewed  CBC WITH DIFFERENTIAL/PLATELET  COMPREHENSIVE METABOLIC PANEL  LIPASE, BLOOD  URINALYSIS, ROUTINE W REFLEX MICROSCOPIC     EKG     RADIOLOGY    PROCEDURES:  Critical Care performed:   Procedures   MEDICATIONS ORDERED IN ED: Medications  morphine (PF) 4 MG/ML injection 4 mg (has no administration in time range)  ondansetron (ZOFRAN) injection 4 mg (4 mg Intravenous Given 12/09/22 0746)  sodium chloride 0.9 % bolus 1,000 mL (1,000 mLs Intravenous New Bag/Given 12/09/22 0746)  alum & mag hydroxide-simeth (MAALOX/MYLANTA) 200-200-20 MG/5ML suspension 30 mL (30 mLs Oral Given 12/09/22 0915)     IMPRESSION / MDM / ASSESSMENT AND PLAN / ED COURSE  I reviewed the triage vital signs and the nursing notes.  Differential diagnosis includes, but is not limited to, gastritis, enteritis, colitis, pancreatitis, biliary pathology, foodborne illness, dehydration, electrolyte abnormality  Patient presenting to the ER for evaluation of symptoms as described above.  Based on symptoms, risk factors and considered above differential, this presenting complaint could reflect a potentially life-threatening illness therefore the patient will be placed on continuous pulse oximetry and telemetry for monitoring.  Laboratory evaluation will be sent to evaluate for the above complaints.  Patient nontoxic-appearing young healthy afebrile hemodynamically stable.  Buttock be sent for the but differential will give IV fluids as well as symptomatic management while obtaining laboratory evaluation given his history of pancreatitis.   Clinical Course as of 12/09/22 0931  Mon Dec 09, 2022  [id number] His blood work is completely normal.  No leukocytosis.  Lipase normal.  He is afebrile hemodynamically stable.  This is not consistent with acute pancreatitis.  Possible gastritis.  Do not feel that further diagnostic testing or imaging clinically indicated.  Does appear appropriate for trial of outpatient follow-up.  Patient agreeable with plan. [PR]    Clinical Course User Index [PR] Willy Eddy, MD     FINAL CLINICAL IMPRESSION(S) / ED DIAGNOSES   Final diagnoses:  Epigastric pain     Rx / DC Orders   ED Discharge Orders          Ordered    pantoprazole (PROTONIX) 20 MG tablet  Daily        12/09/22 0931    ondansetron (ZOFRAN-ODT) 4 MG disintegrating tablet  Every 8 hours PRN        12/09/22 0931             Note:  This document was prepared using Dragon voice recognition software and may include unintentional dictation errors.    Willy Eddy, MD 12/09/22 [phone]

## 2022-12-09 NOTE — ED Notes (Signed)
See triage note, pt reports right sided abd pain for the past few days with n/v/d

## 2024-04-11 ENCOUNTER — Other Ambulatory Visit: Payer: Self-pay

## 2024-04-11 ENCOUNTER — Emergency Department
Admission: EM | Admit: 2024-04-11 | Discharge: 2024-04-11 | Disposition: A | Discharge status: Home / Self Care | Payer: Self-pay | Attending: Emergency Medicine | Admitting: Emergency Medicine

## 2024-04-11 DIAGNOSIS — W268XXA Contact with other sharp object(s), not elsewhere classified, initial encounter: Secondary | ICD-10-CM | POA: Insufficient documentation

## 2024-04-11 DIAGNOSIS — S61412A Laceration without foreign body of left hand, initial encounter: Secondary | ICD-10-CM | POA: Insufficient documentation

## 2024-04-11 DIAGNOSIS — I10 Essential (primary) hypertension: Secondary | ICD-10-CM | POA: Insufficient documentation

## 2024-04-11 DIAGNOSIS — Z23 Encounter for immunization: Secondary | ICD-10-CM | POA: Insufficient documentation

## 2024-04-11 MED ORDER — TETANUS-DIPHTH-ACELL PERTUSSIS 5-2-15.5 LF-MCG/0.5 IM SUSP
0.5000 mL | Freq: Once | INTRAMUSCULAR | Status: AC
  Administered 2024-04-11: 0.5 mL via INTRAMUSCULAR
  Filled 2024-04-11: qty 0.5

## 2024-04-11 MED ORDER — LIDOCAINE HCL (PF) 1 % IJ SOLN
5.0000 mL | Freq: Once | INTRAMUSCULAR | Status: AC
  Administered 2024-04-11: 5 mL
  Filled 2024-04-11: qty 5

## 2024-04-11 NOTE — ED Provider Notes (Signed)
 "   [facility] Emergency Department Provider Note     Event Date/Time   First MD Initiated Contact with Patient 04/11/24 1813     (approximate)   History   Laceration   HPI  Marcus Small is a 33 y.o. male with a history of HTN, anxiety presents to the ED with a left hand laceration.  Patient recent to her door, and apparently cut his hand with a razor blade.  He presents to the ED for evaluation of his wound.  Bleeding currently controlled.     Physical Exam   Triage Vital Signs: ED Triage Vitals [04/11/24 1659]  Encounter Vitals Group     BP (!) 151/108     Girls Systolic BP Percentile      Girls Diastolic BP Percentile      Boys Systolic BP Percentile      Boys Diastolic BP Percentile      Pulse Rate 80     Resp 18     Temp 98 F (36.7 C)     Temp src      SpO2 97 %     Weight 200 lb (90.7 kg)     Height 5' 6 (1.676 m)     Head Circumference      Peak Flow      Pain Score 9     Pain Loc      Pain Education      Exclude from Growth Chart     Most recent vital signs: Vitals:   04/11/24 1659 04/11/24 1928  BP: (!) 151/108 (!) 178/106  Pulse: 80 86  Resp: 18 16  Temp: 98 F (36.7 C)   SpO2: 97% 99%    General Awake, no distress. NAD HEENT NCAT. PERRL. EOMI. No rhinorrhea. Mucous membranes are moist.  CV:  Good peripheral perfusion.  RESP:  Normal effort.  SKIN:  Left hand with a linear laceration extending from the lateral aspect of the palmar hypothenar prominence to the lateral aspect.   ED Results / Procedures / Treatments   Labs (all labs ordered are listed, but only abnormal results are displayed) Labs Reviewed - No data to display   EKG   RADIOLOGY    No results found.   PROCEDURES:  Critical Care performed: No  .Laceration Repair  Date/Time: 04/11/2024 6:28 PM  Performed by: Loyd Candida LULLA Aldona, PA-C Authorized by: Loyd Candida LULLA Aldona, PA-C   Consent:    Consent obtained:  Verbal    Consent given by:  Patient   Risks, benefits, and alternatives were discussed: yes     Risks discussed:  Pain and poor wound healing Universal protocol:    Site/side marked: yes     Patient identity confirmed:  Verbally with patient Anesthesia:    Anesthesia method:  Local infiltration   Local anesthetic:  Lidocaine  1% w/o epi Laceration details:    Location:  Hand   Hand location:  L palm   Length (cm):  4   Depth (mm):  5 Pre-procedure details:    Preparation:  Patient was prepped and draped in usual sterile fashion Exploration:    Hemostasis achieved with:  Direct pressure   Contaminated: no   Treatment:    Area cleansed with:  Povidone-iodine and saline   Amount of cleaning:  Standard   Irrigation solution:  Sterile saline   Irrigation volume:  10   Debridement:  None   Undermining:  None Skin repair:  Repair method:  Sutures   Suture size:  4-0   Suture material:  Nylon   Suture technique:  Simple interrupted   Number of sutures:  5 Approximation:    Approximation:  Close Repair type:    Repair type:  Simple Post-procedure details:    Dressing:  Non-adherent dressing   Procedure completion:  Tolerated well, no immediate complications    MEDICATIONS ORDERED IN ED: Medications  lidocaine  (PF) (XYLOCAINE ) 1 % injection 5 mL (5 mLs Infiltration Given by Other 04/11/24 1853)  Tdap (ADACEL ) injection 0.5 mL (0.5 mLs Intramuscular Given 04/11/24 1928)     IMPRESSION / MDM / ASSESSMENT AND PLAN / ED COURSE  I reviewed the triage vital signs and the nursing notes.                              Differential diagnosis includes, but is not limited to, laceration, abrasion, contusion  Patient's presentation is most consistent with acute complicated illness / injury requiring diagnostic workup.   Patient's diagnosis is consistent with left palm laceration.  Patient consents to wound repair which is performed using nylon sutures.  Good wound edge approximation is  achieved.  Patient will be discharged home with wound care instructions. Patient is to follow up with his PCP or local urgent care for suture removal in 7 to 10 days as discussed, as needed or otherwise directed. Patient is given ED precautions to return to the ED for any worsening or new symptoms.   FINAL CLINICAL IMPRESSION(S) / ED DIAGNOSES   Final diagnoses:  Laceration of left hand without foreign body, initial encounter     Rx / DC Orders   ED Discharge Orders     None        Note:  This document was prepared using Dragon voice recognition software and may include unintentional dictation errors.    Loyd Candida LULLA Aldona, PA-C 04/11/24 2229    Bradler, Evan K, MD 04/17/24 [phone]  "

## 2024-04-11 NOTE — ED Triage Notes (Signed)
 Pt come with c/o left hand laceration on razorblade. Pt reached into drawer and cut it. Pt has bandage in place and bleeding controlled.

## 2024-04-11 NOTE — Discharge Instructions (Addendum)
 Keep the wound clean, dry, and covered.  Follow-up with local urgent care for suture removal in 7 to 10 days.
